# Patient Record
Sex: Male | Born: 1940
Health system: Southern US, Community
[De-identification: ages and names within clinical notes are randomized; demographics above are authoritative.]

## PROBLEM LIST (undated history)

## (undated) DIAGNOSIS — R200 Anesthesia of skin: Secondary | ICD-10-CM

## (undated) DIAGNOSIS — G8929 Other chronic pain: Secondary | ICD-10-CM

## (undated) DIAGNOSIS — Z87438 Personal history of other diseases of male genital organs: Secondary | ICD-10-CM

## (undated) DIAGNOSIS — M545 Low back pain, unspecified: Secondary | ICD-10-CM

## (undated) DIAGNOSIS — M5430 Sciatica, unspecified side: Secondary | ICD-10-CM

## (undated) DIAGNOSIS — E785 Hyperlipidemia, unspecified: Secondary | ICD-10-CM

## (undated) DIAGNOSIS — I2510 Atherosclerotic heart disease of native coronary artery without angina pectoris: Secondary | ICD-10-CM

## (undated) DIAGNOSIS — K219 Gastro-esophageal reflux disease without esophagitis: Secondary | ICD-10-CM

## (undated) DIAGNOSIS — M1990 Unspecified osteoarthritis, unspecified site: Secondary | ICD-10-CM

## (undated) DIAGNOSIS — G2581 Restless legs syndrome: Secondary | ICD-10-CM

## (undated) DIAGNOSIS — I10 Essential (primary) hypertension: Secondary | ICD-10-CM

## (undated) DIAGNOSIS — K439 Ventral hernia without obstruction or gangrene: Secondary | ICD-10-CM

## (undated) DIAGNOSIS — R42 Dizziness and giddiness: Secondary | ICD-10-CM

## (undated) DIAGNOSIS — N400 Enlarged prostate without lower urinary tract symptoms: Secondary | ICD-10-CM

## (undated) HISTORY — DX: Hyperlipidemia, unspecified: E78.5

## (undated) HISTORY — DX: Dizziness and giddiness: R42

## (undated) HISTORY — DX: Anesthesia of skin: R20.0

## (undated) HISTORY — PX: BACK SURGERY: SHX140

## (undated) HISTORY — DX: Benign prostatic hyperplasia without lower urinary tract symptoms: N40.0

## (undated) HISTORY — PX: LUMBAR DISC SURGERY: SHX700

## (undated) HISTORY — DX: Restless legs syndrome: G25.81

## (undated) HISTORY — DX: Sciatica, unspecified side: M54.30

## (undated) HISTORY — PX: JOINT REPLACEMENT: SHX530

## (undated) HISTORY — DX: Personal history of other diseases of male genital organs: Z87.438

## (undated) HISTORY — DX: Ventral hernia without obstruction or gangrene: K43.9

---

## 1998-09-06 ENCOUNTER — Ambulatory Visit (HOSPITAL_COMMUNITY): Admission: RE | Admit: 1998-09-06 | Discharge: 1998-09-06 | Payer: Self-pay | Admitting: Internal Medicine

## 1998-09-06 ENCOUNTER — Encounter: Payer: Self-pay | Admitting: Internal Medicine

## 2009-01-01 ENCOUNTER — Ambulatory Visit (HOSPITAL_COMMUNITY): Admission: RE | Admit: 2009-01-01 | Discharge: 2009-01-01 | Payer: Self-pay | Admitting: *Deleted

## 2009-12-19 ENCOUNTER — Ambulatory Visit: Payer: Self-pay | Admitting: Vascular Surgery

## 2009-12-19 ENCOUNTER — Ambulatory Visit: Admission: RE | Admit: 2009-12-19 | Discharge: 2009-12-19 | Payer: Self-pay | Admitting: Orthopedic Surgery

## 2010-08-25 HISTORY — PX: HAMMER TOE SURGERY: SHX385

## 2011-01-07 NOTE — Op Note (Signed)
NAME:  Cody Floyd, Cody Floyd NO.:  [id number]   MEDICAL RECORD NO.:  [id number]          PATIENT TYPE:  AMB   LOCATION:  ENDO                         FACILITY:  [facility]   PHYSICIAN:  Georgiana Spinner, M.D.    DATE OF BIRTH:  1941-05-22   DATE OF PROCEDURE:  01/01/2009  DATE OF DISCHARGE:                               OPERATIVE REPORT   PROCEDURE:  Upper endoscopy.   INDICATIONS:  Gastroesophageal reflux disease.   ANESTHESIA:  Fentanyl 100 mcg, Versed 8 mg.   PROCEDURE:  With the patient mildly sedated, in the left lateral  decubitus position, the Pentax videoscopic endoscope was inserted in the  mouth, passed under direct vision through the esophagus which appeared  normal into the stomach.  Fundus, body, antrum, duodenal bulb, second  portion of duodenum were all visualized and appeared normal.  From this  point the endoscope was slowly withdrawn taking circumferential views of  duodenal mucosa until the endoscope had been pulled back into stomach,  placed on retroflexion to view the stomach from below.  The endoscope  was straightened and withdrawn taking circumferential views of remaining  gastric and esophageal mucosa.  The patient's vital signs, pulse  oximeter remained stable.  The patient tolerated procedure well without  apparent complications.   FINDINGS:  This was a normal examination without evidence of hiatal  hernia, esophagitis or Barrett's esophagus.   PLAN:  Proceed to colonoscopy.           ______________________________  Georgiana Spinner, M.D.     GMO/MEDQ  D:  01/01/2009  T:  01/01/2009  Job:  [id number]

## 2011-01-07 NOTE — Op Note (Signed)
NAME:  Cody Floyd, KYLLO NO.:  [id number]   MEDICAL RECORD NO.:  [id number]          PATIENT TYPE:  AMB   LOCATION:  ENDO                         FACILITY:  [facility]   PHYSICIAN:  Georgiana Spinner, M.D.    DATE OF BIRTH:  04-19-1941   DATE OF PROCEDURE:  DATE OF DISCHARGE:                               OPERATIVE REPORT   PROCEDURE:  Colonoscopy.   INDICATIONS:  Colon cancer screening.   ANESTHESIA:  Fentanyl 50 mcg, Versed 9 mg.   PROCEDURE:  With the patient mildly sedated in the left lateral  decubitus position, a rectal examination was attempted.  Subsequently,  the Pentax videoscopic colonoscope was inserted into the rectum and  passed under direct vision with pressure applied.  The patient was  uncomfortable through the exam with undersedation at times, but we were  able to advance the endoscope under direct vision subsequently to the  cecum, identified by base of cecum and ileocecal valve and appendiceal  orifice, all of which were photographed.  From this point, the  colonoscope was slowly withdrawn, taking circumferential views of the  colonic mucosa, stopping only to photograph the fact that the patient's  prep was suboptimal and that there were multiple areas of thickish  yellow-green material that was liquid and opaque with solid material  mixed into it which clogged the endoscope suction port, which precluded  therefore thorough examination as we reached the rectum which appeared  normal, indirect that showed hemorrhoids on retroflexed view.  The  endoscope was straightened and withdrawn.  The patient's vital signs,  pulse oximeter remained stable.  The patient tolerated the procedure  well without apparent complications.   FINDINGS:  Slightly suboptimal prep, limited examination, but no gross  lesions seen other than hemorrhoids.   PLAN:  Consider repeat examination as appropriate.           ______________________________  Georgiana Spinner,  M.D.     GMO/MEDQ  D:  01/01/2009  T:  01/01/2009  Job:  [id number]

## 2014-04-17 ENCOUNTER — Other Ambulatory Visit: Payer: Self-pay | Admitting: Orthopedic Surgery

## 2014-04-17 DIAGNOSIS — M545 Low back pain, unspecified: Secondary | ICD-10-CM

## 2014-04-21 ENCOUNTER — Ambulatory Visit
Admission: RE | Admit: 2014-04-21 | Discharge: 2014-04-21 | Disposition: A | Discharge status: Home / Self Care | Payer: Medicare Other | Source: Ambulatory Visit | Attending: Orthopedic Surgery | Admitting: Orthopedic Surgery

## 2014-04-21 DIAGNOSIS — M545 Low back pain, unspecified: Secondary | ICD-10-CM

## 2014-05-16 ENCOUNTER — Other Ambulatory Visit (HOSPITAL_COMMUNITY): Payer: Self-pay | Admitting: Specialist

## 2014-05-24 ENCOUNTER — Ambulatory Visit (HOSPITAL_COMMUNITY)
Admission: RE | Admit: 2014-05-24 | Discharge: 2014-05-24 | Disposition: A | Discharge status: Home / Self Care | Payer: Medicare Other | Source: Ambulatory Visit | Attending: Anesthesiology | Admitting: Anesthesiology

## 2014-05-24 ENCOUNTER — Encounter (HOSPITAL_COMMUNITY): Payer: Self-pay

## 2014-05-24 ENCOUNTER — Encounter (HOSPITAL_COMMUNITY)
Admission: RE | Admit: 2014-05-24 | Discharge: 2014-05-24 | Disposition: A | Discharge status: Home / Self Care | Payer: Medicare Other | Source: Ambulatory Visit | Attending: Specialist | Admitting: Specialist

## 2014-05-24 ENCOUNTER — Encounter (HOSPITAL_COMMUNITY): Payer: Self-pay | Admitting: Pharmacy Technician

## 2014-05-24 DIAGNOSIS — Z01818 Encounter for other preprocedural examination: Secondary | ICD-10-CM | POA: Insufficient documentation

## 2014-05-24 HISTORY — DX: Unspecified osteoarthritis, unspecified site: M19.90

## 2014-05-24 HISTORY — DX: Essential (primary) hypertension: I10

## 2014-05-24 HISTORY — DX: Gastro-esophageal reflux disease without esophagitis: K21.9

## 2014-05-24 LAB — CBC
HEMATOCRIT: 44.8 % (ref 39.0–52.0)
HEMOGLOBIN: 15.2 g/dL (ref 13.0–17.0)
MCH: 28.4 pg (ref 26.0–34.0)
MCHC: 33.9 g/dL (ref 30.0–36.0)
MCV: 83.7 fL (ref 78.0–100.0)
Platelets: 163 10*3/uL (ref 150–400)
RBC: 5.35 MIL/uL (ref 4.22–5.81)
RDW: 15 % (ref 11.5–15.5)
WBC: 7.9 10*3/uL (ref 4.0–10.5)

## 2014-05-24 LAB — COMPREHENSIVE METABOLIC PANEL
ALK PHOS: 73 U/L (ref 39–117)
ALT: 32 U/L (ref 0–53)
ANION GAP: 14 (ref 5–15)
AST: 29 U/L (ref 0–37)
Albumin: 4.2 g/dL (ref 3.5–5.2)
BILIRUBIN TOTAL: 0.8 mg/dL (ref 0.3–1.2)
BUN: 20 mg/dL (ref 6–23)
CHLORIDE: 103 meq/L (ref 96–112)
CO2: 22 mEq/L (ref 19–32)
Calcium: 9.2 mg/dL (ref 8.4–10.5)
Creatinine, Ser: 1.06 mg/dL (ref 0.50–1.35)
GFR calc Af Amer: 78 mL/min — ABNORMAL LOW (ref >90)
GFR calc non Af Amer: 68 mL/min — ABNORMAL LOW (ref >90)
Glucose, Bld: 122 mg/dL — ABNORMAL HIGH (ref 70–99)
POTASSIUM: 4.1 meq/L (ref 3.7–5.3)
SODIUM: 139 meq/L (ref 137–147)
TOTAL PROTEIN: 7.3 g/dL (ref 6.0–8.3)

## 2014-05-24 LAB — TYPE AND SCREEN
ABO/RH(D): A POS
Antibody Screen: NEGATIVE

## 2014-05-24 LAB — SURGICAL PCR SCREEN
MRSA, PCR: NEGATIVE
Staphylococcus aureus: NEGATIVE

## 2014-05-24 LAB — ABO/RH: ABO/RH(D): A POS

## 2014-05-24 NOTE — Progress Notes (Signed)
05/24/14 1159  OBSTRUCTIVE SLEEP APNEA  Have you ever been diagnosed with sleep apnea through a sleep study? No  Do you snore loudly (loud enough to be heard through closed doors)?  0  Do you often feel tired, fatigued, or sleepy during the daytime? 0  Has anyone observed you stop breathing during your sleep? 0  Do you have, or are you being treated for high blood pressure? 1  BMI more than 35 kg/m2? 0  Age over 73 years old? 1  Neck circumference greater than 40 cm/16 inches? 1  Gender: 1  Obstructive Sleep Apnea Score 4  Score 4 or greater  Results sent to PCP

## 2014-05-24 NOTE — H&P (Signed)
Cody Floyd is an 73 y.o. male.   Chief Complaint: leg pain and weakness with ambulation HPI:  He is referred by Dr. Alphonzo Severance for evaluation of increasing back pain with radiation into his lower extremities.  The pain is so severe that he is having difficulties sleeping at nighttime.  It became severe with numbness radiating into the left leg and left foot.  He has noticed difficulty ambulating any distance to the point where he could barely get out of bed and walk even to the front of his house with severe pain.  It began worsening some four months ago.  He reports that he originally had left hip pain and he was placed on a Medrol Dosepak and given some Tylenol #3 back in May.  The pain worsened with severe pain into his left upper buttock and running down into his left leg and into the left heel.  No bowel or bladder symptoms.  He has difficulty rising from a sitting position from laying down to getting up first thing in the morning.  He reports that his pain is severe when he first tries to get up.  He tends to stoop and he tends to lean on carts for support when he is getting in from the parking lot to go shop.  In the past he has had surgery by Dr. Larose Kells with Dr. Marlou Sa assisting years ago for disk protrusion.  He relates that he is experiencing pain in the left anterolateral thigh and into the left anterolateral calf.  Bending and stooping are okay.  He is able to walk up stairs, but down stairs is painful.  RADIOGRAPHS:  The patient's MRI scan is reviewed and plain radiographs are reviewed.  The plain radiographs of the lumbar spine demonstrate, on 12/28/2013, relatively straight spine on the AP radiograph with syndesmophytes forming along the left upper lumbar spine at L1-2 and L2-3.  The SI joints are fairly well maintained as are the hip joints.  The lateral radiographs of the lumbar spine, from the same date, shows anterolisthesis of L4 on L5 and is grade 1 and about 2 or 3 mm.  Degenerative disk  narrowing present at L4-5 about 30% and that at L5-S1 about 70% loss of disk height anterior spurring and endplate sclerosis narrowing of the neural foramen at the L5-S1 level and L4-5 levels.  Some retrolisthesis noted at the L2-3 level, about 3 mm and anterior spurring at this level.  There is also spurring at the L1-2 level with degenerative disk narrowing present.  Lordosis appears to be fairly well maintained.     The patient's MRI scan is demonstrates rather severe multifactorial spinal stenosis at the L3-4 and L4-5 levels.  The degree of stenosis is worse at L4-5 with disk bulge and advanced degenerative joint changes with ligamentous hypertrophy impressing on the thecal sac and narrowing the AP diameter of the canal severely.  He has mild right greater than left foraminal narrowing at L4-5.  At 3-4 moderate to severe spinal stenosis with bulge an facet hypertrophy and congenitally small canal, mass effect on the thecal sac narrowing the lateral recess at that segment.  L5-S1 shows postsurgical changes on the right side.   There is epidural scar present.  The study was not done with enhancement and no significant nerve root encroachment felt to be present at the L5-S1 level.  At L2-3 retrolisthesis that is mild with bulging disk at that segment.  Narrowing of the lateral recesses, but no foraminal  compromise or definite nerve encroachment.   Past Medical History  Diagnosis Date  . Hypertension   . GERD (gastroesophageal reflux disease)   . Arthritis     Past Surgical History  Procedure Laterality Date  . Back surgery    . Hammer toe surgery Left 2012    History reviewed. No pertinent family history. Social History:  reports that he has never smoked. He has never used smokeless tobacco. He reports that he does not drink alcohol or use illicit drugs.  Allergies: No Known Allergies  Medications Prior to Admission  Medication Sig Dispense Refill  . esomeprazole (NEXIUM) 20 MG capsule Take  20 mg by mouth daily as needed.      . propranolol (INNOPRAN XL) 120 MG 24 hr capsule Take 120 mg by mouth daily as needed (Pt knows when to take because of how he feels in the morning).        No results found for this or any previous visit (from the past 48 hour(s)). No results found.  Review of Systems  Musculoskeletal: Positive for back pain.       Bilateral leg weakness.  All other systems reviewed and are negative.   Blood pressure 171/71, pulse 67, temperature 97.9 F (36.6 C), temperature source Oral, resp. rate 20, height 6\' 1"  (1.854 m), weight 108.863 kg (240 lb), SpO2 95.00%. Physical Exam  Constitutional: He is oriented to person, place, and time. He appears well-developed and well-nourished.  HENT:  Head: Normocephalic and atraumatic.  Eyes: EOM are normal. Pupils are equal, round, and reactive to light.  Neck: Normal range of motion.  Cardiovascular: Normal rate, regular rhythm and normal heart sounds.   Respiratory: Effort normal and breath sounds normal.  GI: Soft.  Musculoskeletal:   he can forward bend fingertips about four inches from the floor.  Extension is decreased and lordosis is decreased.  Reflexes at the knee are trace and symmetric and at the ankle 0 and symmetric.  He has weakness in left foot dorsiflexion and it is 4+/5.  EHL strength is decreased on the left at 5-/5.  The sciatic stretch sign is mildly positive on the left at about 20-30 degrees short of full extension and negative on the right.  Popliteal compression sign is negative on both sides.  Range of motion of the lumbar spine as described.  The pain is localizing to the left posterior superior iliac spine left upper buttock.  He is nontender over the left greater trochanter.  His strength in hip abduction, adduction and hip flexion testing are normal.  Knee extension strength is slightly decreased on the left compared to the right at about 5-/5.  Reverse straight leg raise is positive on the left  side.    Neurological: He is alert and oriented to person, place, and time.  Skin: Skin is warm and dry.      ASSESSMENT:  This patient's study shows severe narrowing of the spinal canal at both L3-4 and L4-5.  His clinical exam is consistent with left L4 radiculopathy and left L3-4 radiculopathy on the left.  Severe worsening pain and discomfort associated with increasing symptoms of neurogenic claudication consistent with rather severe finding of lumbar spinal stenosis.    PLAN:  central decompressive laminectomy at both the L3-4 and L4-5 levels for severe stenosis  NITKA,JAMES E 05/29/2014, 7:17 AM

## 2014-05-24 NOTE — Progress Notes (Signed)
PCP:Dr. Jani Gravel at [facility] [phone]. Requesting ekg and last office note

## 2014-05-24 NOTE — Pre-Procedure Instructions (Signed)
JAYMIEN LANDIN  05/24/2014   Your procedure is scheduled on:  Monday October 5  Report to [facility] Main Entrance "A" at 5:30AM.  Call this number if you have problems the morning of surgery: [phone]   Remember:   Do not eat food or drink liquids after midnight.   Take these medicines the morning of surgery with A SIP OF WATER: nexium, propranolol if needed   Do not wear jewelry, make-up or nail polish.  Do not wear lotions, powders, or perfumes. You may wear deodorant.  Do not shave 48 hours prior to surgery. Men may shave face and neck.  Do not bring valuables to the hospital.  [facility] is not responsible  for any belongings or valuables.               Contacts, dentures or bridgework may not be worn into surgery.  Leave suitcase in the car. After surgery it may be brought to your room.  For patients admitted to the hospital, discharge time is determined by your                treatment team.               Patients discharged the day of surgery will not be allowed to drive  home.  Name and phone number of your driver:   Special Instructions: review preparing for surgery handout   Please read over the following fact sheets that you were given: Pain Booklet, Coughing and Deep Breathing, Blood Transfusion Information, MRSA Information and Surgical Site Infection Prevention

## 2014-05-25 NOTE — Progress Notes (Signed)
Anesthesia Chart Review: Patient is a 73 year old male scheduled for L3-4, L4-5 central laminectomy on 05/29/14 by Dr. Louanne Skye.  History includes non-smoker, HTN, GERD, arthritis, back surgery. BMI is consistent with mild obesity.  PCP is Dr. Maudie Mercury [phone]). Prior EKG requested for Dr. Julianne Rice office if available. Currently no records have been received, and there is no available comparison EKG in Epic or Muse.     EKG on 05/24/14 showed SB with first degree AVB, LAD, right BBB. Prior EKG requested from Dr. Julianne Rice office as available, but records are pending and there are no prior EKGs in Epic or Muse.  No reported history of CAD/MI/CHF or DM.  No CV symptoms documented from his PAT visit.   Preoperative CXR and labs noted.   I've asked nursing staff to have me or Kabbe, FNP-C review prior EKG if received from his PCP, otherwise in none available then he will be further evaluated by his assigned anesthesiologist on the day of surgery to further clinically correlate.  If he remains asymptomatic from a CV standpoint then I would anticipate that he could proceed as planned.    Cody Floyd [facility] Short Stay Center/Anesthesiology Phone [phone] 05/25/2014 5:06 PM

## 2014-05-28 MED ORDER — CHLORHEXIDINE GLUCONATE 4 % EX LIQD
60.0000 mL | Freq: Once | CUTANEOUS | Status: DC
  Filled 2014-05-28: qty 60

## 2014-05-28 MED ORDER — CEFAZOLIN SODIUM-DEXTROSE 2-3 GM-% IV SOLR
2.0000 g | INTRAVENOUS | Status: AC
  Administered 2014-05-29: 2 g via INTRAVENOUS
  Filled 2014-05-28: qty 50

## 2014-05-29 ENCOUNTER — Observation Stay (HOSPITAL_COMMUNITY)
Admission: RE | Admit: 2014-05-29 | Discharge: 2014-05-30 | Disposition: A | Discharge status: Home Health | Payer: Medicare Other | Source: Ambulatory Visit | Attending: Specialist | Admitting: Specialist

## 2014-05-29 ENCOUNTER — Encounter (HOSPITAL_COMMUNITY)
Admission: RE | Disposition: A | Discharge status: Home Health | Payer: Self-pay | Source: Ambulatory Visit | Attending: Specialist

## 2014-05-29 ENCOUNTER — Ambulatory Visit (HOSPITAL_COMMUNITY): Payer: Medicare Other

## 2014-05-29 ENCOUNTER — Ambulatory Visit (HOSPITAL_COMMUNITY): Payer: Medicare Other | Admitting: Anesthesiology

## 2014-05-29 ENCOUNTER — Encounter (HOSPITAL_COMMUNITY): Payer: Self-pay | Admitting: *Deleted

## 2014-05-29 ENCOUNTER — Encounter (HOSPITAL_COMMUNITY): Payer: Medicare Other | Admitting: Vascular Surgery

## 2014-05-29 DIAGNOSIS — R262 Difficulty in walking, not elsewhere classified: Secondary | ICD-10-CM | POA: Insufficient documentation

## 2014-05-29 DIAGNOSIS — I10 Essential (primary) hypertension: Secondary | ICD-10-CM | POA: Diagnosis not present

## 2014-05-29 DIAGNOSIS — M4806 Spinal stenosis, lumbar region: Principal | ICD-10-CM | POA: Insufficient documentation

## 2014-05-29 DIAGNOSIS — K219 Gastro-esophageal reflux disease without esophagitis: Secondary | ICD-10-CM | POA: Insufficient documentation

## 2014-05-29 DIAGNOSIS — Z6831 Body mass index (BMI) 31.0-31.9, adult: Secondary | ICD-10-CM | POA: Insufficient documentation

## 2014-05-29 DIAGNOSIS — M5116 Intervertebral disc disorders with radiculopathy, lumbar region: Secondary | ICD-10-CM | POA: Insufficient documentation

## 2014-05-29 DIAGNOSIS — M5416 Radiculopathy, lumbar region: Secondary | ICD-10-CM | POA: Diagnosis present

## 2014-05-29 DIAGNOSIS — M48062 Spinal stenosis, lumbar region with neurogenic claudication: Secondary | ICD-10-CM | POA: Diagnosis present

## 2014-05-29 HISTORY — PX: LUMBAR LAMINECTOMY/DECOMPRESSION MICRODISCECTOMY: SHX5026

## 2014-05-29 SURGERY — LUMBAR LAMINECTOMY/DECOMPRESSION MICRODISCECTOMY
Anesthesia: General

## 2014-05-29 MED ORDER — ACETAMINOPHEN 325 MG PO TABS: 650.0000 mg | ORAL_TABLET | ORAL | Status: DC | PRN

## 2014-05-29 MED ORDER — ROCURONIUM BROMIDE 100 MG/10ML IV SOLN
INTRAVENOUS | Status: DC | PRN
  Administered 2014-05-29: 50 mg via INTRAVENOUS
  Administered 2014-05-29 (×3): 10 mg via INTRAVENOUS

## 2014-05-29 MED ORDER — THROMBIN 20000 UNITS EX SOLR
CUTANEOUS | Status: AC
  Filled 2014-05-29: qty 20000

## 2014-05-29 MED ORDER — BUPIVACAINE HCL 0.5 % IJ SOLN
INTRAMUSCULAR | Status: DC | PRN
  Administered 2014-05-29: 30 mL

## 2014-05-29 MED ORDER — PROMETHAZINE HCL 25 MG/ML IJ SOLN
12.5000 mg | Freq: Four times a day (QID) | INTRAMUSCULAR | Status: DC | PRN
  Administered 2014-05-29: 12.5 mg via INTRAVENOUS
  Filled 2014-05-29: qty 1

## 2014-05-29 MED ORDER — MIDAZOLAM HCL 5 MG/5ML IJ SOLN
INTRAMUSCULAR | Status: DC | PRN
  Administered 2014-05-29: 1 mg via INTRAVENOUS

## 2014-05-29 MED ORDER — ONDANSETRON HCL 4 MG/2ML IJ SOLN
INTRAMUSCULAR | Status: DC | PRN
  Administered 2014-05-29: 4 mg via INTRAVENOUS

## 2014-05-29 MED ORDER — ALUM & MAG HYDROXIDE-SIMETH 200-200-20 MG/5ML PO SUSP: 30.0000 mL | Freq: Four times a day (QID) | ORAL | Status: DC | PRN

## 2014-05-29 MED ORDER — GLYCOPYRROLATE 0.2 MG/ML IJ SOLN
INTRAMUSCULAR | Status: DC | PRN
  Administered 2014-05-29: .8 mg via INTRAVENOUS

## 2014-05-29 MED ORDER — EPHEDRINE SULFATE 50 MG/ML IJ SOLN
INTRAMUSCULAR | Status: DC | PRN
  Administered 2014-05-29: 5 mg via INTRAVENOUS
  Administered 2014-05-29: 10 mg via INTRAVENOUS

## 2014-05-29 MED ORDER — 0.9 % SODIUM CHLORIDE (POUR BTL) OPTIME
TOPICAL | Status: DC | PRN
  Administered 2014-05-29: 1000 mL

## 2014-05-29 MED ORDER — LACTATED RINGERS IV SOLN
INTRAVENOUS | Status: DC | PRN
  Administered 2014-05-29 (×3): via INTRAVENOUS

## 2014-05-29 MED ORDER — PHENYLEPHRINE 40 MCG/ML (10ML) SYRINGE FOR IV PUSH (FOR BLOOD PRESSURE SUPPORT)
PREFILLED_SYRINGE | INTRAVENOUS | Status: AC
  Filled 2014-05-29: qty 10

## 2014-05-29 MED ORDER — OXYCODONE HCL 5 MG/5ML PO SOLN: 5.0000 mg | Freq: Once | ORAL | Status: DC | PRN

## 2014-05-29 MED ORDER — PHENYLEPHRINE HCL 10 MG/ML IJ SOLN
10.0000 mg | INTRAMUSCULAR | Status: DC | PRN
  Administered 2014-05-29: 40 ug/min via INTRAVENOUS

## 2014-05-29 MED ORDER — FENTANYL CITRATE 0.05 MG/ML IJ SOLN
INTRAMUSCULAR | Status: AC
  Filled 2014-05-29: qty 5

## 2014-05-29 MED ORDER — FENTANYL CITRATE 0.05 MG/ML IJ SOLN
INTRAMUSCULAR | Status: DC | PRN
  Administered 2014-05-29 (×3): 50 ug via INTRAVENOUS

## 2014-05-29 MED ORDER — DEXTROSE 5 % IV SOLN
INTRAVENOUS | Status: DC | PRN
  Administered 2014-05-29: 08:00:00 via INTRAVENOUS

## 2014-05-29 MED ORDER — PROPRANOLOL HCL ER BEADS 120 MG PO CP24
120.0000 mg | ORAL_CAPSULE | Freq: Every day | ORAL | Status: DC | PRN
  Filled 2014-05-29: qty 1

## 2014-05-29 MED ORDER — SODIUM CHLORIDE 0.9 % IJ SOLN: 3.0000 mL | INTRAMUSCULAR | Status: DC | PRN

## 2014-05-29 MED ORDER — BUPIVACAINE-EPINEPHRINE 0.5% -1:200000 IJ SOLN
INTRAMUSCULAR | Status: DC | PRN
  Administered 2014-05-29: 20 mL

## 2014-05-29 MED ORDER — HYDROMORPHONE HCL 1 MG/ML IJ SOLN: 0.2500 mg | INTRAMUSCULAR | Status: DC | PRN

## 2014-05-29 MED ORDER — PROMETHAZINE HCL 12.5 MG PO TABS: 12.5000 mg | ORAL_TABLET | Freq: Four times a day (QID) | ORAL | Status: DC | PRN

## 2014-05-29 MED ORDER — GELATIN ABSORBABLE MT POWD
OROMUCOSAL | Status: DC | PRN
  Administered 2014-05-29: 10:00:00 via TOPICAL

## 2014-05-29 MED ORDER — OXYCODONE-ACETAMINOPHEN 5-325 MG PO TABS: 1.0000 | ORAL_TABLET | ORAL | Status: DC | PRN

## 2014-05-29 MED ORDER — SODIUM CHLORIDE 0.9 % IJ SOLN: 3.0000 mL | Freq: Two times a day (BID) | INTRAMUSCULAR | Status: DC

## 2014-05-29 MED ORDER — METHOCARBAMOL 500 MG PO TABS: 500.0000 mg | ORAL_TABLET | Freq: Four times a day (QID) | ORAL | Status: DC | PRN

## 2014-05-29 MED ORDER — OXYCODONE HCL 5 MG PO TABS: 5.0000 mg | ORAL_TABLET | Freq: Once | ORAL | Status: DC | PRN

## 2014-05-29 MED ORDER — SODIUM CHLORIDE 0.9 % IV SOLN
INTRAVENOUS | Status: DC
  Administered 2014-05-29 – 2014-05-30 (×2): via INTRAVENOUS

## 2014-05-29 MED ORDER — NEOSTIGMINE METHYLSULFATE 10 MG/10ML IV SOLN
INTRAVENOUS | Status: AC
  Filled 2014-05-29: qty 1

## 2014-05-29 MED ORDER — ACETAMINOPHEN 650 MG RE SUPP: 650.0000 mg | RECTAL | Status: DC | PRN

## 2014-05-29 MED ORDER — MIDAZOLAM HCL 2 MG/2ML IJ SOLN
INTRAMUSCULAR | Status: AC
  Filled 2014-05-29: qty 2

## 2014-05-29 MED ORDER — ARTIFICIAL TEARS OP OINT
TOPICAL_OINTMENT | OPHTHALMIC | Status: AC
  Filled 2014-05-29: qty 3.5

## 2014-05-29 MED ORDER — PHENOL 1.4 % MT LIQD: 1.0000 | OROMUCOSAL | Status: DC | PRN

## 2014-05-29 MED ORDER — PANTOPRAZOLE SODIUM 40 MG PO TBEC
40.0000 mg | DELAYED_RELEASE_TABLET | Freq: Every day | ORAL | Status: DC
  Administered 2014-05-29 – 2014-05-30 (×2): 40 mg via ORAL
  Filled 2014-05-29 (×2): qty 1

## 2014-05-29 MED ORDER — METHOCARBAMOL 1000 MG/10ML IJ SOLN
500.0000 mg | Freq: Four times a day (QID) | INTRAMUSCULAR | Status: DC | PRN
  Filled 2014-05-29: qty 5

## 2014-05-29 MED ORDER — THROMBIN 20000 UNITS EX KIT
PACK | CUTANEOUS | Status: DC | PRN
  Administered 2014-05-29: 20000 [IU] via TOPICAL

## 2014-05-29 MED ORDER — NEOSTIGMINE METHYLSULFATE 10 MG/10ML IV SOLN
INTRAVENOUS | Status: DC | PRN
  Administered 2014-05-29: 5 mg via INTRAVENOUS

## 2014-05-29 MED ORDER — ROCURONIUM BROMIDE 50 MG/5ML IV SOLN
INTRAVENOUS | Status: AC
  Filled 2014-05-29: qty 2

## 2014-05-29 MED ORDER — SODIUM CHLORIDE 0.9 % IV SOLN: 250.0000 mL | INTRAVENOUS | Status: DC

## 2014-05-29 MED ORDER — EPHEDRINE SULFATE 50 MG/ML IJ SOLN
INTRAMUSCULAR | Status: AC
  Filled 2014-05-29: qty 1

## 2014-05-29 MED ORDER — MENTHOL 3 MG MT LOZG
1.0000 | LOZENGE | OROMUCOSAL | Status: DC | PRN
  Administered 2014-05-29: 3 mg via ORAL
  Filled 2014-05-29: qty 9

## 2014-05-29 MED ORDER — ONDANSETRON HCL 4 MG/2ML IJ SOLN
4.0000 mg | INTRAMUSCULAR | Status: DC | PRN
  Administered 2014-05-29: 4 mg via INTRAVENOUS
  Filled 2014-05-29: qty 2

## 2014-05-29 MED ORDER — PROPOFOL 10 MG/ML IV BOLUS
INTRAVENOUS | Status: DC | PRN
  Administered 2014-05-29: 190 mg via INTRAVENOUS

## 2014-05-29 MED ORDER — OXYCODONE-ACETAMINOPHEN 5-325 MG PO TABS
1.0000 | ORAL_TABLET | ORAL | Status: DC | PRN
  Administered 2014-05-29 – 2014-05-30 (×2): 2 via ORAL
  Filled 2014-05-29 (×2): qty 2

## 2014-05-29 MED ORDER — LIDOCAINE HCL (CARDIAC) 20 MG/ML IV SOLN
INTRAVENOUS | Status: DC | PRN
  Administered 2014-05-29: 55 mg via INTRAVENOUS

## 2014-05-29 MED ORDER — SODIUM CHLORIDE 0.9 % IJ SOLN
INTRAMUSCULAR | Status: AC
  Filled 2014-05-29: qty 10

## 2014-05-29 MED ORDER — LIDOCAINE HCL (CARDIAC) 20 MG/ML IV SOLN
INTRAVENOUS | Status: AC
  Filled 2014-05-29: qty 5

## 2014-05-29 MED ORDER — PROMETHAZINE HCL 25 MG/ML IJ SOLN: 6.2500 mg | INTRAMUSCULAR | Status: DC | PRN

## 2014-05-29 MED ORDER — PROPOFOL 10 MG/ML IV BOLUS
INTRAVENOUS | Status: AC
  Filled 2014-05-29: qty 20

## 2014-05-29 MED ORDER — ARTIFICIAL TEARS OP OINT
TOPICAL_OINTMENT | OPHTHALMIC | Status: DC | PRN
  Administered 2014-05-29: 1 via OPHTHALMIC

## 2014-05-29 MED ORDER — EVICEL 2 ML EX KIT
PACK | CUTANEOUS | Status: AC
  Filled 2014-05-29: qty 1

## 2014-05-29 MED ORDER — ONDANSETRON HCL 4 MG/2ML IJ SOLN
INTRAMUSCULAR | Status: AC
  Filled 2014-05-29: qty 2

## 2014-05-29 MED ORDER — BUPIVACAINE-EPINEPHRINE (PF) 0.5% -1:200000 IJ SOLN
INTRAMUSCULAR | Status: AC
  Filled 2014-05-29: qty 30

## 2014-05-29 MED ORDER — MORPHINE SULFATE 2 MG/ML IJ SOLN: 1.0000 mg | INTRAMUSCULAR | Status: DC | PRN

## 2014-05-29 MED ORDER — PHENYLEPHRINE HCL 10 MG/ML IJ SOLN
INTRAMUSCULAR | Status: DC | PRN
  Administered 2014-05-29 (×2): 80 ug via INTRAVENOUS

## 2014-05-29 MED ORDER — HYDROCODONE-ACETAMINOPHEN 5-325 MG PO TABS
1.0000 | ORAL_TABLET | ORAL | Status: DC | PRN
  Administered 2014-05-30: 2 via ORAL
  Filled 2014-05-29: qty 2

## 2014-05-29 MED ORDER — CEFAZOLIN SODIUM 1-5 GM-% IV SOLN
1.0000 g | Freq: Three times a day (TID) | INTRAVENOUS | Status: AC
  Administered 2014-05-29 (×2): 1 g via INTRAVENOUS
  Filled 2014-05-29 (×3): qty 50

## 2014-05-29 MED ORDER — BUPIVACAINE LIPOSOME 1.3 % IJ SUSP
20.0000 mL | INTRAMUSCULAR | Status: AC
  Administered 2014-05-29: 20 mL
  Filled 2014-05-29: qty 20

## 2014-05-29 MED ORDER — GLYCOPYRROLATE 0.2 MG/ML IJ SOLN
INTRAMUSCULAR | Status: AC
  Filled 2014-05-29: qty 4

## 2014-05-29 SURGICAL SUPPLY — 59 items
AIRSTRIP 3X4 (GAUZE/BANDAGES/DRESSINGS) ×2 IMPLANT
BLADE SURG 10 STRL SS (BLADE) ×2 IMPLANT
BUR ROUND FLUTED 4 SOFT TCH (BURR) IMPLANT
CORDS BIPOLAR (ELECTRODE) ×2 IMPLANT
DERMABOND ADVANCED (GAUZE/BANDAGES/DRESSINGS) ×1
DERMABOND ADVANCED .7 DNX12 (GAUZE/BANDAGES/DRESSINGS) ×1 IMPLANT
DRAPE INCISE IOBAN 66X45 STRL (DRAPES) IMPLANT
DRAPE MICROSCOPE LEICA (MISCELLANEOUS) ×2 IMPLANT
DRAPE POUCH INSTRU U-SHP 10X18 (DRAPES) ×2 IMPLANT
DRAPE PROXIMA HALF (DRAPES) IMPLANT
DRAPE SURG 17X23 STRL (DRAPES) ×8 IMPLANT
DRSG MEPILEX BORDER 4X4 (GAUZE/BANDAGES/DRESSINGS) IMPLANT
DRSG MEPILEX BORDER 4X8 (GAUZE/BANDAGES/DRESSINGS) ×2 IMPLANT
DURAPREP 26ML APPLICATOR (WOUND CARE) ×2 IMPLANT
ELECT BLADE 4.0 EZ CLEAN MEGAD (MISCELLANEOUS) ×2
ELECT CAUTERY BLADE 6.4 (BLADE) ×2 IMPLANT
ELECT REM PT RETURN 9FT ADLT (ELECTROSURGICAL) ×2
ELECTRODE BLDE 4.0 EZ CLN MEGD (MISCELLANEOUS) ×1 IMPLANT
ELECTRODE REM PT RTRN 9FT ADLT (ELECTROSURGICAL) ×1 IMPLANT
EVACUATOR 1/8 PVC DRAIN (DRAIN) IMPLANT
GLOVE BIO SURGEON STRL SZ7 (GLOVE) ×4 IMPLANT
GLOVE BIOGEL PI IND STRL 7.0 (GLOVE) ×3 IMPLANT
GLOVE BIOGEL PI IND STRL 7.5 (GLOVE) ×1 IMPLANT
GLOVE BIOGEL PI INDICATOR 7.0 (GLOVE) ×3
GLOVE BIOGEL PI INDICATOR 7.5 (GLOVE) ×1
GLOVE ECLIPSE 7.0 STRL STRAW (GLOVE) ×8 IMPLANT
GLOVE ECLIPSE 9.0 STRL (GLOVE) ×2 IMPLANT
GLOVE SURG 8.5 LATEX PF (GLOVE) ×12 IMPLANT
GOWN STRL REUS W/ TWL LRG LVL3 (GOWN DISPOSABLE) ×2 IMPLANT
GOWN STRL REUS W/TWL 2XL LVL3 (GOWN DISPOSABLE) ×2 IMPLANT
GOWN STRL REUS W/TWL LRG LVL3 (GOWN DISPOSABLE) ×2
KIT BASIN OR (CUSTOM PROCEDURE TRAY) ×2 IMPLANT
KIT ROOM TURNOVER OR (KITS) ×2 IMPLANT
MANIFOLD NEPTUNE II (INSTRUMENTS) ×2 IMPLANT
NEEDLE 22X1 1/2 (OR ONLY) (NEEDLE) ×2 IMPLANT
NEEDLE SPNL 18GX3.5 QUINCKE PK (NEEDLE) ×4 IMPLANT
NS IRRIG 1000ML POUR BTL (IV SOLUTION) ×2 IMPLANT
PACK LAMINECTOMY ORTHO (CUSTOM PROCEDURE TRAY) ×2 IMPLANT
PAD ARMBOARD 7.5X6 YLW CONV (MISCELLANEOUS) ×4 IMPLANT
PATTIES SURGICAL .5 X.5 (GAUZE/BANDAGES/DRESSINGS) IMPLANT
PATTIES SURGICAL .75X.75 (GAUZE/BANDAGES/DRESSINGS) IMPLANT
PATTIES SURGICAL 1X1 (DISPOSABLE) IMPLANT
SPECIMEN JAR SMALL (MISCELLANEOUS) ×2 IMPLANT
SPONGE LAP 4X18 X RAY DECT (DISPOSABLE) IMPLANT
SPONGE SURGIFOAM ABS GEL 100 (HEMOSTASIS) IMPLANT
SUT VIC AB 0 CT1 27 (SUTURE) ×2
SUT VIC AB 0 CT1 27XBRD ANBCTR (SUTURE) ×2 IMPLANT
SUT VIC AB 1 CT1 27 (SUTURE) ×3
SUT VIC AB 1 CT1 27XBRD ANBCTR (SUTURE) ×3 IMPLANT
SUT VIC AB 2-0 CT1 27 (SUTURE)
SUT VIC AB 2-0 CT1 TAPERPNT 27 (SUTURE) IMPLANT
SUT VICRYL 0 UR6 27IN ABS (SUTURE) IMPLANT
SUT VICRYL 4-0 PS2 18IN ABS (SUTURE) ×2 IMPLANT
SYR CONTROL 10ML LL (SYRINGE) ×2 IMPLANT
TOWEL OR 17X24 6PK STRL BLUE (TOWEL DISPOSABLE) ×2 IMPLANT
TOWEL OR 17X26 10 PK STRL BLUE (TOWEL DISPOSABLE) ×2 IMPLANT
TRAY FOLEY CATH 16FRSI W/METER (SET/KITS/TRAYS/PACK) IMPLANT
WATER STERILE IRR 1000ML POUR (IV SOLUTION) ×2 IMPLANT
YANKAUER SUCT BULB TIP NO VENT (SUCTIONS) ×2 IMPLANT

## 2014-05-29 NOTE — Interval H&P Note (Signed)
History and Physical Interval Note:  05/29/2014 7:17 AM  Cody Floyd  has presented today for surgery, with the diagnosis of severe spinal stenosis L3-4, L4-5  The various methods of treatment have been discussed with the patient and family. After consideration of risks, benefits and other options for treatment, the patient has consented to  Procedure(s): CENTRAL LUMBAR LAMINECTOMY L3-4 and L4-5 (N/A) as a surgical intervention .  The patient's history has been reviewed, patient examined, no change in status, stable for surgery.  I have reviewed the patient's chart and labs.  Questions were answered to the patient's satisfaction.     Valyncia Wiens E

## 2014-05-29 NOTE — Op Note (Addendum)
05/29/2014  10:30 AM  PATIENT:  Cody Floyd  73 y.o. male  MRN: [id number]  OPERATIVE REPORT  PRE-OPERATIVE DIAGNOSIS:  severe spinal stenosis L3-4, L4-5  POST-OPERATIVE DIAGNOSIS:  severe spinal stenosis L3-4, L4-5  PROCEDURE:  Procedure(s): CENTRAL LUMBAR LAMINECTOMY L3-4 and L4-5    SURGEON:  Jessy Oto, MD     ASSISTANT:  Phillips Hay, PA-C  (Present throughout the entire procedure and necessary for completion of procedure in a timely manner)     ANESTHESIA:  General,supplemented with MARCAINE 0.5% 1:1 EXPAREL 1.3%, Amount: 30 ml Dr. Orene Desanctis.    COMPLICATIONS:  None.     EBL: 300 cc  DRAINS: Foley to SD.   PROCEDURE: The patient was met in the holding area, and the appropriate L3-4 and  L4-5 levels identified and marked with an "X" and my initials since this was a bilateral procedure. The patient was then transported to OR and was placed under general anesthesia without difficulty. Then placed on the operative table in a prone position. The [facility] spine OR table was used. The patient received appropriate preoperative antibiotic prophylaxis. Nursing staff inserted a Foley catheter under sterile conditions prior to turning. Standard prep with DuraPrep solution from the mid dorsal spine to the mid sacral level.Time-out procedure was called and correct .  Patient was draped in the usual manner. Iodine Vi-Drape was used. 2 x 18-gauge spinal needles were placed at the expected and of the expected levels. Intraoperative lateral xray demonstrated the needles placed posterior to the L3 and L4 levels. Initial incisions were made at palpable process representing L3 and at the lowest aspect of the incision at the expected level of L4 approximately 2 1/2inches in length. The incision were infiltrated with marcaine 1/2% plain:exparel 1.3% 1:1. Total of 20 cc were used. Skin subcutaneous layers divide down to the lumbodorsal fascia this was incised in both  L2-3, L3-4 and L4-5 spinous  processes, clamps placed at the spinous process at L3 and L4. A lateral radiograph demonstrated the upper clamp at the L3 spinous process. Lower clamp was at the L4 spinous process. The L3-4 to L4-5 levels were then exposed using Cobb elevators and electrocautery. These areas were then packed. Boss McCollough retractor was inserted at the incision site. Leksell rongeur used to remove a portion of the inferior aspect is process of L3 40% and the entire spinous processes of L4 and superior 30% of L5. Leksell rongeur was then used to further remove bone down to the ligamentum flavum and the central portions of the lamina and base of the spinous process were thinned. 4 mm burr was also used to thin the lamina of L4 centrally and the inferior 50% of the lamina of L3. The facets were then exposed out laterally and were hypertrophic.Osteotomes then used medial aspect of the L3-4 facets and L4-5 facet bilaterally resecting approximately 10-15% of the facet bilaterally. Kerrisons were then used to resect central portions of the lamina of L4 and the inferior 50% of L3 lamina. Ligamentum flavum at the L3-4 and L4-5 levels were then carefully resected centrally preserving at least 7-8 mm with at the pars level L4 and L3. The central laminectomy was further widened performing more resection of the medial aspect of facet at both L3-4 and L4-5. Note that loupe magnification and headlight was used for this portion procedure.the central portions of the ligamentum flavum at the L3-4 level were resected and the medial aspect of the L3-4 facet resected over about 5-10%. Hypertrophic flava  was found to be present. The operating room microscope sterilely draped brought into the field and then carefully the right side decompressed at each level beginning at the right L5 neuroforamen resect bone over the superior lamina of L5 and decompressing the right L5 foramen and reflected portion ligamentum flavum off the medial aspect of the right  L3-4 facet. Hockey-stick nerve probe could be passed out both the L4 and L5 neuroforamen. The medial aspect of facet at the L3-4 level was then carefully evaluated and hypertrophic ligamentum flavum resected using Kerrisons decompressing the lateral recess on this right side. This was done such that hockey-stick nerve probe could be used to pass the outer recess demonstrating patency and decompression of the L4 nerve root and L5 nerve roots. Attention then turned to the L3-4 were further debridement of the lateral recess and hypertrophic ligamentum flavum and reflected portions of ligamentum flavum were carried out. Changing sides of the OR table in decompression was carried out in similar fashion on the left side from the left L5 nerve root to the L3-4 level. At the left L3-4 level severe lateral recess stenosis was noted lateral recess was decompressed and the L4 nerve root freed up.  Further decompression was then carried out the left side of the spinal canal decompressing the L5, the L4 and the L3 nerve roots. Following this then a hockey-stick nerve probe could be passed out easily bilateral L3, L4 and L5 neuroforamen. Following decompression bilaterally thrombin-soaked Gelfoam was applied to the laminotomy defects and these areas packed with small sponges. Adequate hemostasis was obtained at all levels. The thrombin-soaked Gelfoam was then removed and a medium. The incision showed no active bleeding present.  Boss McCollough retractor was then removed. The end of the case, an approximately 15 mm central laminotomy was present with normal pulsation of the thecal sac present. Following additional irrigation and with no active bleeding present at any level level, the incision was then closed by first approximating the lumbar muscles the midline with interrupted #1 Vicryl sutures loosely the lumbodorsal fascia then approximated with interrupted #1 Vicryl sutures.  Additional MARCAINE 0.5% 1:1 EXPAREL 1.3%, Amount:  10 ml infiltrated the fascia.  Deep subcutaneous layers approximated with interrupted #0 Vicryl suture more superficial layers with interrupted 2-0 Vicryl suture the skin closed with a running subcutaneous stitch of 4-0 Vicryl. Dermabond was applied to the skin margins.  A mepilex bandage was applied to the midline incision site. All instrument and sponge counts were correct. Patient was then reactivated returned to supine position and extubated. He was then returned to recovery room in satisfactory condition.   Phillips Hay, PA-C performed the duties of assistant surgeon throughout this case she assisted using loupe magnification and OR microscope retracting delicate neural structures suctioning over the neural structures throughout the case bilaterally . She was present from the beginning of the case to the end of the case. Assisted in positioning the patient and  the arm and legs. She also participated in removal the patient from the operating table.    Ajahnae Rathgeber E  05/29/2014, 10:30 AM

## 2014-05-29 NOTE — Transfer of Care (Signed)
Immediate Anesthesia Transfer of Care Note  Patient: Cody Floyd  Procedure(s) Performed: Procedure(s): CENTRAL LUMBAR LAMINECTOMY L3-4 and L4-5 (N/A)  Patient Location: PACU  Anesthesia Type:General  Level of Consciousness: awake, sedated and patient cooperative  Airway & Oxygen Therapy: Patient Spontanous Breathing and Patient connected to nasal cannula oxygen  Post-op Assessment: Report given to PACU RN, Post -op Vital signs reviewed and stable and Patient moving all extremities  Post vital signs: Reviewed and stable  Complications: No apparent anesthesia complications

## 2014-05-29 NOTE — Brief Op Note (Signed)
05/29/2014  10:13 AM  PATIENT:  Cody Floyd  73 y.o. male  PRE-OPERATIVE DIAGNOSIS:  severe spinal stenosis L3-4, L4-5  POST-OPERATIVE DIAGNOSIS:  severe spinal stenosis L3-4, L4-5  PROCEDURE:  Procedure(s): CENTRAL LUMBAR LAMINECTOMY L3-4 and L4-5 (N/A)OR MICROSCOPE  SURGEON:  Surgeon(s) and Role:James Louanne Skye, MD    * Jessy Oto, MD - Primary  PHYSICIAN ASSISTANT: Phillips Hay, PA-C   ANESTHESIA:   local and general, Dr. Orene Desanctis  EBL:  Total I/O In: 2902 [I.V.:1050] Out: 410 [Urine:110; Blood:300]  BLOOD ADMINISTERED:none  DRAINS: Urinary Catheter (Foley)   LOCAL MEDICATIONS USED:  MARCAINE 0.5% 1:1 EXPAREL 1.3%, Amount: 30 ml   SPECIMEN:  No Specimen  DISPOSITION OF SPECIMEN:  N/A  COUNTS:  YES  TOURNIQUET:  * No tourniquets in log *  DICTATION: .Dragon Dictation  PLAN OF CARE: Admit for overnight observation  PATIENT DISPOSITION:  PACU - hemodynamically stable.   Delay start of Pharmacological VTE agent (>24hrs) due to surgical blood loss or risk of bleeding: yes

## 2014-05-29 NOTE — Anesthesia Postprocedure Evaluation (Signed)
  Anesthesia Post-op Note  Patient: Cody Floyd  Procedure(s) Performed: Procedure(s): CENTRAL LUMBAR LAMINECTOMY L3-4 and L4-5 (N/A)  Patient Location: PACU  Anesthesia Type:General  Level of Consciousness: awake and alert   Airway and Oxygen Therapy: Patient Spontanous Breathing  Post-op Pain: mild  Post-op Assessment: Post-op Vital signs reviewed  Post-op Vital Signs: stable  Last Vitals:  Filed Vitals:   05/29/14 1200  BP:   Pulse: 57  Temp: 36.7 C  Resp: 7    Complications: No apparent anesthesia complications

## 2014-05-29 NOTE — Discharge Instructions (Signed)
    No lifting greater than 10 lbs. Avoid bending, stooping and twisting. Walk in house for first week them may start to get out slowly increasing distance up to one mile by 3 weeks post op. Keep incision dry for 3 days, may use tegaderm or similar water impervious dressing.  

## 2014-05-29 NOTE — Anesthesia Procedure Notes (Signed)
Procedure Name: Intubation Date/Time: 05/29/2014 7:36 AM Performed by: Terrill Mohr Pre-anesthesia Checklist: Patient identified, Emergency Drugs available, Suction available and Patient being monitored Patient Re-evaluated:Patient Re-evaluated prior to inductionOxygen Delivery Method: Circle system utilized Preoxygenation: Pre-oxygenation with 100% oxygen Intubation Type: IV induction Ventilation: Mask ventilation without difficulty Laryngoscope Size: Mac and 3 (EMT student) Grade View: Grade II Tube type: Oral Tube size: 7.5 mm Number of attempts: 1 Airway Equipment and Method: Stylet Placement Confirmation: ETT inserted through vocal cords under direct vision,  breath sounds checked- equal and bilateral and positive ETCO2 Secured at: 21 (cm at gum) cm Tube secured with: Tape Dental Injury: Teeth and Oropharynx as per pre-operative assessment

## 2014-05-29 NOTE — Anesthesia Preprocedure Evaluation (Addendum)
Anesthesia Evaluation  Patient identified by MRN, date of birth, ID band Patient awake    Reviewed: Allergy & Precautions, H&P , NPO status , Patient's Chart, lab work & pertinent test results, reviewed documented beta blocker date and time   History of Anesthesia Complications Negative for: history of anesthetic complications  Airway Mallampati: I TM Distance: >3 FB Neck ROM: Full    Dental  (+) Lower Dentures, Upper Dentures, Dental Advisory Given   Pulmonary neg pulmonary ROS,  breath sounds clear to auscultation        Cardiovascular hypertension, Pt. on home beta blockers and Pt. on medications Rhythm:Regular Rate:Normal  Propranolol 5 a.m.   Neuro/Psych    GI/Hepatic GERD-  Medicated and Controlled,  Endo/Other  Morbid obesity  Renal/GU      Musculoskeletal  (+) Arthritis -,   Abdominal (+) + obese,   Peds  Hematology   Anesthesia Other Findings Somewhat receding chin  Reproductive/Obstetrics                         Anesthesia Physical Anesthesia Plan  ASA: II  Anesthesia Plan: General   Post-op Pain Management:    Induction: Intravenous  Airway Management Planned: Oral ETT  Additional Equipment:   Intra-op Plan:   Post-operative Plan: Extubation in OR  Informed Consent: I have reviewed the patients History and Physical, chart, labs and discussed the procedure including the risks, benefits and alternatives for the proposed anesthesia with the patient or authorized representative who has indicated his/her understanding and acceptance.   Dental advisory given  Plan Discussed with: CRNA and Surgeon  Anesthesia Plan Comments:         Anesthesia Quick Evaluation

## 2014-05-30 DIAGNOSIS — M4806 Spinal stenosis, lumbar region: Secondary | ICD-10-CM | POA: Diagnosis not present

## 2014-05-30 LAB — CBC
HEMATOCRIT: 41.3 % (ref 39.0–52.0)
Hemoglobin: 13.7 g/dL (ref 13.0–17.0)
MCH: 28.4 pg (ref 26.0–34.0)
MCHC: 33.2 g/dL (ref 30.0–36.0)
MCV: 85.5 fL (ref 78.0–100.0)
Platelets: 134 10*3/uL — ABNORMAL LOW (ref 150–400)
RBC: 4.83 MIL/uL (ref 4.22–5.81)
RDW: 15.3 % (ref 11.5–15.5)
WBC: 11.7 10*3/uL — AB (ref 4.0–10.5)

## 2014-05-30 LAB — BASIC METABOLIC PANEL
ANION GAP: 11 (ref 5–15)
BUN: 18 mg/dL (ref 6–23)
CHLORIDE: 104 meq/L (ref 96–112)
CO2: 26 mEq/L (ref 19–32)
Calcium: 8.6 mg/dL (ref 8.4–10.5)
Creatinine, Ser: 1.15 mg/dL (ref 0.50–1.35)
GFR calc Af Amer: 71 mL/min — ABNORMAL LOW (ref >90)
GFR calc non Af Amer: 61 mL/min — ABNORMAL LOW (ref >90)
Glucose, Bld: 106 mg/dL — ABNORMAL HIGH (ref 70–99)
POTASSIUM: 4.2 meq/L (ref 3.7–5.3)
Sodium: 141 mEq/L (ref 137–147)

## 2014-05-30 NOTE — Progress Notes (Signed)
Patient ID: Cody Floyd, male   DOB: 1940/12/10, 73 y.o.   MRN: [id number] Subjective: 1 Day Post-Op Procedure(s) (LRB): CENTRAL LUMBAR LAMINECTOMY L3-4 and L4-5 (N/A)  Patient reports pain as mild.    Objective:   VITALS:  Temp:  [97.6 F (36.4 C)-99 F (37.2 C)] 99 F (37.2 C) (10/06 0631) Pulse Rate:  [57-73] 70 (10/06 0631) Resp:  [12] 12 (10/05 1241) BP: (122-148)/(55-62) 148/60 mmHg (10/06 0631) SpO2:  [95 %-96 %] 96 % (10/06 0631)  Neurologically intact ABD soft Neurovascular intact Sensation intact distally Intact pulses distally Dorsiflexion/Plantar flexion intact Incision: dressing C/D/I   LABS  Recent Labs  05/30/14 0539  HGB 13.7  WBC 11.7*  PLT 134*    Recent Labs  05/30/14 0539  NA 141  K 4.2  CL 104  CO2 26  BUN 18  CREATININE 1.15  GLUCOSE 106*   No results found for this basename: LABPT, INR,  in the last 72 hours   Assessment/Plan: 1 Day Post-Op Procedure(s) (LRB): CENTRAL LUMBAR LAMINECTOMY L3-4 and L4-5 (N/A)  Discharge home with home health Walking program at home for exercise. Can use NSAIDs along with narcotics to relieve pain, Ice also important. Return to office in 2 weeks.  Aseel Truxillo E 05/30/2014, 12:22 PM

## 2014-05-30 NOTE — Care Management Note (Signed)
CARE MANAGEMENT NOTE 05/30/2014  Patient:  Cody Floyd, Cody Floyd   Account Number:  [id number]  Date Initiated:  05/30/2014  Documentation initiated by:  Ricki Miller  Subjective/Objective Assessment:   73 yr old male admitted with Severe spinal stenosis. Underwent L3-4, L4-5  lumbar laminectomy.     Action/Plan:   Case manager spoke with patient concerning need for home health physical therapy. Patient states he does not want therapy. Has someone that will be with him.   Anticipated DC Date:  05/30/2014   Anticipated DC Plan:  [facility]  CM consult      [facility]  HOME HEALTH   Choice offered to / List presented to:  C-1 Patient        [place] arranged  [place] - 11 Patient Refused      Status of service:  Completed, signed off Medicare Important Message given?   (If response is "NO", the following Medicare IM given date fields will be blank) Date Medicare IM given:   Medicare IM given by:   Date Additional Medicare IM given:   Additional Medicare IM given by:    Discharge Disposition:  HOME/SELF CARE  Per UR Regulation:  Reviewed for med. necessity/level of care/duration of stay  If discussed at [place] of Stay Meetings, dates discussed:    Comments:

## 2014-05-30 NOTE — Progress Notes (Signed)
Patient ID: Cody Floyd, male   DOB: 27-Apr-1941, 73 y.o.   MRN: [id number] Subjective: 1 Day Post-Op Procedure(s) (LRB): CENTRAL LUMBAR LAMINECTOMY L3-4 and L4-5 (N/A) Alert, oriented x 4, foley discontinued this am. Mild nausea this past evening, no emesis.  Patient reports pain as moderate.    Objective:   VITALS:  Temp:  [97.6 F (36.4 C)-99 F (37.2 C)] 99 F (37.2 C) (10/06 0631) Pulse Rate:  [57-73] 70 (10/06 0631) Resp:  [6-21] 12 (10/05 1241) BP: (122-148)/(55-65) 148/60 mmHg (10/06 0631) SpO2:  [93 %-98 %] 96 % (10/06 0631)  Neurologically intact ABD soft Neurovascular intact Sensation intact distally Intact pulses distally Incision: scant drainage No cellulitis present   LABS  Recent Labs  05/30/14 0539  HGB 13.7  WBC 11.7*  PLT 134*    Recent Labs  05/30/14 0539  NA 141  K 4.2  CL 104  CO2 26  BUN 18  CREATININE 1.15  GLUCOSE 106*   No results found for this basename: LABPT, INR,  in the last 72 hours Patient is just post foley removal. Needs PT/OT eval today.  Assessment/Plan: 1 Day Post-Op Procedure(s) (LRB): CENTRAL LUMBAR LAMINECTOMY L3-4 and L4-5 (N/A)  Advance diet Up with therapy Discharge home with home health He is only starting to stand and PT and OT eval planned for today. If he does well then will D/C to home this afternoon.  Incentive spirometry and cough and deep breath q hour.  Cody Floyd E 05/30/2014, 10:06 AM

## 2014-05-30 NOTE — Evaluation (Signed)
Physical Therapy Evaluation Patient Details Name: Cody Floyd MRN: [id number] DOB: [birth year] Today's Date: 05/30/2014   History of Present Illness  pt presents with L3-5 Lami.    Clinical Impression  Pt indicates he is moving better now than prior to surgery.  Pt still unsteady and requires use of RW and cues for back precautions.  Pt indicates diminished sensation on sole of Bil feet and is unsure if he has neuropathies.  Pt ok for D/C to home from PT stand point.  Will continue to follow if remains on acute.      Follow Up Recommendations Home health PT;Supervision - Intermittent    Equipment Recommendations  None recommended by PT    Recommendations for Other Services       Precautions / Restrictions Precautions Precautions: Back Precaution Booklet Issued: Yes (comment) Restrictions Weight Bearing Restrictions: No      Mobility  Bed Mobility               General bed mobility comments: pt sitting EOB.  Reviewed technique for bed mobility.    Transfers Overall transfer level: Needs assistance Equipment used: Rolling walker (2 wheeled) Transfers: Sit to/from Stand Sit to Stand: Supervision         General transfer comment: cues for UE sue and getting closer to recliner prior to sitting.    Ambulation/Gait Ambulation/Gait assistance: Min guard Ambulation Distance (Feet): 140 Feet Assistive device: Rolling walker (2 wheeled) Gait Pattern/deviations: Step-through pattern;Decreased stride length     General Gait Details: pt moves slowly and with Bil knee instability, that improves with increased distance.  pt indicates he is ambulating better now than prior to surgery.    Stairs Stairs: Yes Stairs assistance: Min guard Stair Management: Two rails;Step to pattern;Forwards Number of Stairs: 2 General stair comments: cues for sequecing gait on stairs.    Wheelchair Mobility    Modified Rankin (Stroke Patients Only)       Balance                                              Pertinent Vitals/Pain Pain Assessment: 0-10 Pain Score: 5  Pain Location: Back Pain Descriptors / Indicators: Aching Pain Intervention(s): Premedicated before session;Repositioned    Home Living Family/patient expects to be discharged to:: Private residence Living Arrangements: Spouse/significant other Available Help at Discharge: Family;Available PRN/intermittently Type of Home: House Home Access: Stairs to enter Entrance Stairs-Rails: Right;Left;Can reach both Entrance Stairs-Number of Steps: 2 Home Layout: One level Home Equipment: Walker - 2 wheels;Grab bars - toilet;Grab bars - tub/shower      Prior Function Level of Independence: Independent with assistive device(s)               Hand Dominance        Extremity/Trunk Assessment   Upper Extremity Assessment: Defer to OT evaluation           Lower Extremity Assessment: Generalized weakness;RLE deficits/detail;LLE deficits/detail      Cervical / Trunk Assessment: Normal  Communication   Communication: No difficulties  Cognition Arousal/Alertness: Awake/alert Behavior During Therapy: WFL for tasks assessed/performed Overall Cognitive Status: Within Functional Limits for tasks assessed                      General Comments      Exercises        Assessment/Plan  PT Assessment Patient needs continued PT services  PT Diagnosis Difficulty walking   PT Problem List Decreased strength;Decreased activity tolerance;Decreased balance;Decreased mobility;Decreased coordination;Decreased knowledge of use of DME;Impaired sensation;Decreased knowledge of precautions;Pain  PT Treatment Interventions DME instruction;Gait training;Stair training;Functional mobility training;Therapeutic activities;Therapeutic exercise;Balance training;Neuromuscular re-education;Patient/family education   PT Goals (Current goals can be found in the Care Plan section) Acute  Rehab PT Goals Patient Stated Goal: Walk without pain PT Goal Formulation: With patient Time For Goal Achievement: 06/06/14 Potential to Achieve Goals: Good    Frequency Min 5X/week   Barriers to discharge        Co-evaluation               End of Session Equipment Utilized During Treatment: Gait belt Activity Tolerance: Patient tolerated treatment well Patient left: in chair;with call bell/phone within reach Nurse Communication: Mobility status    Functional Assessment Tool Used: Clinical Judgement Functional Limitation: Mobility: Walking and moving around Mobility: Walking and Moving Around Current Status (F3545): At least 1 percent but less than 20 percent impaired, limited or restricted Mobility: Walking and Moving Around Goal Status [phone]): 0 percent impaired, limited or restricted    Time: 0837-0908 PT Time Calculation (min): 31 min   Charges:   PT Evaluation $Initial PT Evaluation Tier I: 1 Procedure PT Treatments $Gait Training: 23-37 mins   PT G Codes:   Functional Assessment Tool Used: Clinical Judgement Functional Limitation: Mobility: Walking and moving around    [place]wood[place]ornton Pap[place], [place] [phone] 05/30/2014, 10:36 AM

## 2014-05-30 NOTE — Evaluation (Signed)
Occupational Therapy Evaluation Patient Details Name: Cody Floyd MRN: [id number] DOB: [birth year] Today's Date: 05/30/2014    History of Present Illness pt presents with L3-5 Lami.     Clinical Impression   Pt admitted with the above diagnoses and presents with below problem list. Pt will benefit from continued acute OT to address the below listed deficits and maximize independence with basic ADLs prior to d/c home. PTA pt was mod I for ADLs with occasional use of rw. Currently pt is at supervision-min guard level for most ADLs. Educated pt on techniques and AE for safe completion of ADLs while observing precautions. Also educated on safe technique with rw during functional mobility especially hand placement during sit<>stand.     Follow Up Recommendations  Supervision - Intermittent;No OT follow up    Equipment Recommendations  None recommended by OT;Other (comment) (pt has recommended equipment)    Recommendations for Other Services       Precautions / Restrictions Precautions Precautions: Back Precaution Booklet Issued: Yes (comment) Precaution Comments: reviewed precautions Restrictions Weight Bearing Restrictions: No      Mobility Bed Mobility Overal bed mobility: Needs Assistance Bed Mobility: Rolling;Sidelying to Sit Rolling: Supervision Sidelying to sit: Supervision       General bed mobility comments: cues for technique  Transfers Overall transfer level: Needs assistance Equipment used: Rolling walker (2 wheeled) Transfers: Sit to/from Stand Sit to Stand: Supervision         General transfer comment: cues for technique with rw use especially related to not bearing weight through BUE onto rw during sit<>stand    Balance Overall balance assessment: Needs assistance Sitting-balance support: No upper extremity supported;Feet supported Sitting balance-Leahy Scale: Good     Standing balance support: Bilateral upper extremity supported;During functional  activity Standing balance-Leahy Scale: Fair                              ADL Overall ADL's : Needs assistance/impaired Eating/Feeding: Set up;Sitting   Grooming: Set up;Sitting   Upper Body Bathing: Set up;Sitting;With adaptive equipment   Lower Body Bathing: Sit to/from stand;With adaptive equipment;Supervison/ safety   Upper Body Dressing : Set up;Sitting;With adaptive equipment   Lower Body Dressing: Sit to/from stand;With adaptive equipment;Supervision/safety   Toilet Transfer: Supervision/safety;Ambulation;RW (3n1 over toilet)   Toileting- Clothing Manipulation and Hygiene: Supervision/safety;Sit to/from stand   Tub/ Shower Transfer: Supervision/safety;Ambulation;3 in 1;Rolling walker   Functional mobility during ADLs: Supervision/safety;Rolling walker       Vision                     Perception     Praxis      Pertinent Vitals/Pain Pain Assessment: 0-10 Pain Score:  ("not much pain") Pain Location: Back Pain Descriptors / Indicators: Aching Pain Intervention(s): Limited activity within patient's tolerance;Monitored during session     Hand Dominance     Extremity/Trunk Assessment Upper Extremity Assessment Upper Extremity Assessment: Overall WFL for tasks assessed   Lower Extremity Assessment Lower Extremity Assessment: Defer to PT evaluation Cervical / Trunk Assessment Cervical / Trunk Assessment: Normal   Communication Communication Communication: No difficulties   Cognition Arousal/Alertness: Awake/alert Behavior During Therapy: WFL for tasks assessed/performed Overall Cognitive Status: Within Functional Limits for tasks assessed                     General Comments       Exercises  Shoulder Instructions      Home Living Family/patient expects to be discharged to:: Private residence Living Arrangements: Spouse/significant other Available Help at Discharge: Family;Available PRN/intermittently Type of  Home: House Home Access: Stairs to enter [employer] of Steps: 2 Entrance Stairs-Rails: Right;Left;Can reach both Home Layout: One level     Bathroom Shower/Tub: Occupational psychologist: Standard     Home Equipment: Walker - 2 wheels;Grab bars - toilet;Grab bars - tub/shower;Bedside commode;Adaptive equipment;Hand held shower head Adaptive Equipment: Reacher        Prior Functioning/Environment Level of Independence: Independent with assistive device(s)        Comments: rw as needed    OT Diagnosis: Acute pain   OT Problem List: Impaired balance (sitting and/or standing);Decreased knowledge of use of DME or AE;Decreased knowledge of precautions;Pain   OT Treatment/Interventions: Self-care/ADL training;Therapeutic exercise;DME and/or AE instruction;Therapeutic activities;Patient/family education;Balance training    OT Goals(Current goals can be found in the care plan section) Acute Rehab OT Goals Patient Stated Goal: get back to doing things I was doing before OT Goal Formulation: With patient Time For Goal Achievement: 06/06/14 Potential to Achieve Goals: Good ADL Goals Pt Will Transfer to Toilet: with modified independence;ambulating (3n1 over toilet ) Pt Will Perform Tub/Shower Transfer: with modified independence;ambulating;3 in 1;rolling walker  OT Frequency: Min 2X/week   Barriers to D/C:            Co-evaluation              End of Session Equipment Utilized During Treatment: Gait belt;Rolling walker  Activity Tolerance: Patient tolerated treatment well Patient left: in bed;with call bell/phone within reach   Time: 1100-1120 OT Time Calculation (min): 20 min Charges:  OT General Charges $OT Visit: 1 Procedure OT Evaluation $Initial OT Evaluation Tier I: 1 Procedure OT Treatments $Self Care/Home Management : 8-22 mins G-Codes: OT G-codes **NOT FOR INPATIENT CLASS** Functional Assessment Tool Used: clinical  judgement Functional Limitation: Self care Self Care Current Status (H9977): At least 1 percent but less than 20 percent impaired, limited or restricted Self Care Goal Status (S1423): At least 1 percent but less than 20 percent impaired, limited or restricted Self Care Discharge Status [phone]): At least 1 percent but less than 20 percent impaired, limited or restricted  Hortencia Pilar 05/30/2014, 11:41 AM

## 2014-05-30 NOTE — Progress Notes (Signed)
Patient discharged to home accompanied by family. Discharge instructions, follow-up appointment, and rx given and explained and patient stated understanding. IV was removed and patient left unit in a stable condition with all personal belongings via wheelchair.

## 2014-06-01 ENCOUNTER — Encounter (HOSPITAL_COMMUNITY): Payer: Self-pay | Admitting: Specialist

## 2014-06-16 NOTE — Discharge Summary (Signed)
Physician Discharge Summary  Patient ID: Cody Floyd MRN: [id number] DOB/AGE: 25-Jul-1941 73 y.o.  Admit date: 05/29/2014 Discharge date: 05/30/2014  Admission Diagnoses:  Principal Problem:   Spinal stenosis, lumbar region, with neurogenic claudication Active Problems:   Left lumbar radiculopathy   Discharge Diagnoses:  Same  Past Medical History  Diagnosis Date  . Hypertension   . GERD (gastroesophageal reflux disease)   . Arthritis     Surgeries: Procedure(s): CENTRAL LUMBAR LAMINECTOMY L3-4 and L4-5 on 05/29/2014   Consultants:    Discharged Condition: Improved  Hospital Course: Cody Floyd is an 73 y.o. male who was admitted 05/29/2014 with a chief complaint of No chief complaint on file. , and found to have a diagnosis of Spinal stenosis, lumbar region, with neurogenic claudication.  They were brought to the operating room on 05/29/2014 and underwent the above named procedures.    They were given perioperative antibiotics:  Anti-infectives   Start     Dose/Rate Route Frequency Ordered Stop   05/29/14 1245  ceFAZolin (ANCEF) IVPB 1 g/50 mL premix     1 g 100 mL/hr over 30 Minutes Intravenous Every 8 hours 05/29/14 1238 05/29/14 2144   05/29/14 0600  ceFAZolin (ANCEF) IVPB 2 g/50 mL premix     2 g 100 mL/hr over 30 Minutes Intravenous On call to O.R. 05/28/14 1249 05/29/14 0740    Following surgery he recovered in the PACU and was transferred to Med/Surgery floor [place], POD#1 foley was discontinued. He was able to void without difficulty. PT and OT evaluated his standing and walking tolerance and he was stable and independent In getting up and walking. His incision was dry no erythrema or fluctuance. Vital signs were stable and leg pain preoperatively was Absent. He was discharged home the afternoon of POD#1. They were given sequential compression devices, early ambulation, and no chemoprophylaxis for DVT prophylaxis due to recent CNS surgery.  They benefited  maximally from their hospital stay and there were no complications.    Recent vital signs:  Filed Vitals:   05/30/14 0631  BP: 148/60  Pulse: 70  Temp: 99 F (37.2 C)  Resp:     Recent laboratory studies:  Results for orders placed during the hospital encounter of 05/29/14  CBC      Result Value Ref Range   WBC 11.7 (*) 4.0 - 10.5 K/uL   RBC 4.83  4.22 - 5.81 MIL/uL   Hemoglobin 13.7  13.0 - 17.0 g/dL   HCT 41.3  39.0 - 52.0 %   MCV 85.5  78.0 - 100.0 fL   MCH 28.4  26.0 - 34.0 pg   MCHC 33.2  30.0 - 36.0 g/dL   RDW 15.3  11.5 - 15.5 %   Platelets 134 (*) 150 - 400 K/uL  BASIC METABOLIC PANEL      Result Value Ref Range   Sodium 141  137 - 147 mEq/L   Potassium 4.2  3.7 - 5.3 mEq/L   Chloride 104  96 - 112 mEq/L   CO2 26  19 - 32 mEq/L   Glucose, Bld 106 (*) 70 - 99 mg/dL   BUN 18  6 - 23 mg/dL   Creatinine, Ser 1.15  0.50 - 1.35 mg/dL   Calcium 8.6  8.4 - 10.5 mg/dL   GFR calc non Af Amer 61 (*) >90 mL/min   GFR calc Af Amer 71 (*) >90 mL/min   Anion gap 11  5 - 15  Discharge Medications:     Medication List         esomeprazole 20 MG capsule  Commonly known as:  NEXIUM  Take 20 mg by mouth daily as needed.     oxyCODONE-acetaminophen 5-325 MG per tablet  Commonly known as:  ROXICET  Take 1 tablet by mouth every 4 (four) hours as needed.     propranolol 120 MG 24 hr capsule  Commonly known as:  INNOPRAN XL  Take 120 mg by mouth daily as needed (Pt knows when to take because of how he feels in the morning).        Diagnostic Studies: Dg Chest 2 View  05/24/2014   CLINICAL DATA:  Preoperative for lumbar spine surgery.  EXAM: CHEST  2 VIEW  COMPARISON:  07/22/2010  FINDINGS: The cardiac silhouette, mediastinal and hilar contours are within normal limits and stable. There is tortuosity and calcification of the thoracic aorta. The lungs are clear. No pleural effusion. The bony thorax is intact. Remote healed lower left rib fractures are noted.  IMPRESSION:  No acute cardiopulmonary findings.   Electronically Signed   By: Kalman Jewels M.D.   On: 05/24/2014 14:01   Dg Lumbar Spine 2-3 Views  05/29/2014   CLINICAL DATA:  L3-4 and L4-5 laminectomy.  EXAM: LUMBAR SPINE - 2-3 VIEW  COMPARISON:  MRI lumbar spine 04/21/2014.  FINDINGS: We are provided with 2 intraoperative views of the lumbar spine in the lateral projection. On the first image, probes are at the level of the inferior L3 and L4 pedicles. On the second image, probes are at the level of the L3 and L4 lamina.  IMPRESSION: Localization as above.   Electronically Signed   By: Inge Rise M.D.   On: 05/29/2014 11:02    Disposition: 06-Home-Health Care Svc      Discharge Instructions   Call MD / Call 911    Complete by:  As directed   If you experience chest pain or shortness of breath, CALL 911 and be transported to the hospital emergency room.  If you develope a fever above 101 F, pus (white drainage) or increased drainage or redness at the wound, or calf pain, call your surgeon's office.     Constipation Prevention    Complete by:  As directed   Drink plenty of fluids.  Prune juice may be helpful.  You may use a stool softener, such as Colace (over the counter) 100 mg twice a day.  Use MiraLax (over the counter) for constipation as needed.     Diet - low sodium heart healthy    Complete by:  As directed      Discharge instructions    Complete by:  As directed   No lifting greater than 10 lbs. Avoid bending, stooping and twisting. Walk in house for first week them may start to get out slowly increasing distance up to one half mile by 3 weeks post op. Keep incision dry for 3 days, may use tegaderm or similar water impervious dressing.     Driving restrictions    Complete by:  As directed   No driving for 2 weeks     Increase activity slowly as tolerated    Complete by:  As directed      Lifting restrictions    Complete by:  As directed   No lifting for 6 weeks            Follow-up Information   Follow up with Ky Moskowitz E,  MD In 2 weeks.   Specialty:  Orthopedic Surgery   Contact information:   [address] [place] [address] [phone]        Signed: Jessy Oto 06/16/2014, 6:57 AM

## 2015-01-16 DIAGNOSIS — L570 Actinic keratosis: Secondary | ICD-10-CM | POA: Diagnosis not present

## 2015-01-16 DIAGNOSIS — L578 Other skin changes due to chronic exposure to nonionizing radiation: Secondary | ICD-10-CM | POA: Diagnosis not present

## 2015-02-06 DIAGNOSIS — A630 Anogenital (venereal) warts: Secondary | ICD-10-CM | POA: Diagnosis not present

## 2015-02-06 DIAGNOSIS — N508 Other specified disorders of male genital organs: Secondary | ICD-10-CM | POA: Diagnosis not present

## 2015-03-08 DIAGNOSIS — L570 Actinic keratosis: Secondary | ICD-10-CM | POA: Diagnosis not present

## 2015-05-31 DIAGNOSIS — L578 Other skin changes due to chronic exposure to nonionizing radiation: Secondary | ICD-10-CM | POA: Diagnosis not present

## 2015-05-31 DIAGNOSIS — L570 Actinic keratosis: Secondary | ICD-10-CM | POA: Diagnosis not present

## 2015-05-31 DIAGNOSIS — L821 Other seborrheic keratosis: Secondary | ICD-10-CM | POA: Diagnosis not present

## 2015-06-08 DIAGNOSIS — Z23 Encounter for immunization: Secondary | ICD-10-CM | POA: Diagnosis not present

## 2015-11-26 DIAGNOSIS — L570 Actinic keratosis: Secondary | ICD-10-CM | POA: Diagnosis not present

## 2015-11-27 DIAGNOSIS — H402221 Chronic angle-closure glaucoma, left eye, mild stage: Secondary | ICD-10-CM | POA: Diagnosis not present

## 2015-11-27 DIAGNOSIS — H2513 Age-related nuclear cataract, bilateral: Secondary | ICD-10-CM | POA: Diagnosis not present

## 2015-11-27 DIAGNOSIS — H402211 Chronic angle-closure glaucoma, right eye, mild stage: Secondary | ICD-10-CM | POA: Diagnosis not present

## 2015-11-27 DIAGNOSIS — H04123 Dry eye syndrome of bilateral lacrimal glands: Secondary | ICD-10-CM | POA: Diagnosis not present

## 2015-11-27 DIAGNOSIS — H401190 Primary open-angle glaucoma, unspecified eye, stage unspecified: Secondary | ICD-10-CM | POA: Diagnosis not present

## 2015-12-12 DIAGNOSIS — M5440 Lumbago with sciatica, unspecified side: Secondary | ICD-10-CM | POA: Diagnosis not present

## 2015-12-12 DIAGNOSIS — R252 Cramp and spasm: Secondary | ICD-10-CM | POA: Diagnosis not present

## 2015-12-12 DIAGNOSIS — R7303 Prediabetes: Secondary | ICD-10-CM | POA: Diagnosis not present

## 2015-12-12 DIAGNOSIS — I10 Essential (primary) hypertension: Secondary | ICD-10-CM | POA: Diagnosis not present

## 2015-12-12 DIAGNOSIS — H402221 Chronic angle-closure glaucoma, left eye, mild stage: Secondary | ICD-10-CM | POA: Diagnosis not present

## 2015-12-12 DIAGNOSIS — H402211 Chronic angle-closure glaucoma, right eye, mild stage: Secondary | ICD-10-CM | POA: Diagnosis not present

## 2015-12-12 DIAGNOSIS — M47816 Spondylosis without myelopathy or radiculopathy, lumbar region: Secondary | ICD-10-CM | POA: Diagnosis not present

## 2015-12-12 DIAGNOSIS — N400 Enlarged prostate without lower urinary tract symptoms: Secondary | ICD-10-CM | POA: Diagnosis not present

## 2015-12-20 DIAGNOSIS — Z0000 Encounter for general adult medical examination without abnormal findings: Secondary | ICD-10-CM | POA: Diagnosis not present

## 2015-12-20 DIAGNOSIS — I10 Essential (primary) hypertension: Secondary | ICD-10-CM | POA: Diagnosis not present

## 2015-12-20 DIAGNOSIS — R739 Hyperglycemia, unspecified: Secondary | ICD-10-CM | POA: Diagnosis not present

## 2015-12-20 DIAGNOSIS — N400 Enlarged prostate without lower urinary tract symptoms: Secondary | ICD-10-CM | POA: Diagnosis not present

## 2015-12-20 DIAGNOSIS — R109 Unspecified abdominal pain: Secondary | ICD-10-CM | POA: Diagnosis not present

## 2015-12-24 ENCOUNTER — Other Ambulatory Visit: Payer: Self-pay | Admitting: Internal Medicine

## 2015-12-24 DIAGNOSIS — R29898 Other symptoms and signs involving the musculoskeletal system: Secondary | ICD-10-CM

## 2016-01-05 ENCOUNTER — Inpatient Hospital Stay: Admission: RE | Admit: 2016-01-05 | Payer: Self-pay | Source: Ambulatory Visit

## 2016-01-07 DIAGNOSIS — L570 Actinic keratosis: Secondary | ICD-10-CM | POA: Diagnosis not present

## 2016-01-07 DIAGNOSIS — L821 Other seborrheic keratosis: Secondary | ICD-10-CM | POA: Diagnosis not present

## 2016-01-10 ENCOUNTER — Other Ambulatory Visit: Payer: Self-pay | Admitting: Rheumatology

## 2016-01-10 DIAGNOSIS — M4807 Spinal stenosis, lumbosacral region: Secondary | ICD-10-CM

## 2016-01-10 DIAGNOSIS — R748 Abnormal levels of other serum enzymes: Secondary | ICD-10-CM | POA: Diagnosis not present

## 2016-01-10 DIAGNOSIS — M179 Osteoarthritis of knee, unspecified: Secondary | ICD-10-CM | POA: Diagnosis not present

## 2016-01-10 DIAGNOSIS — R270 Ataxia, unspecified: Secondary | ICD-10-CM | POA: Diagnosis not present

## 2016-01-10 DIAGNOSIS — M1612 Unilateral primary osteoarthritis, left hip: Secondary | ICD-10-CM | POA: Diagnosis not present

## 2016-01-10 DIAGNOSIS — M1990 Unspecified osteoarthritis, unspecified site: Secondary | ICD-10-CM | POA: Diagnosis not present

## 2016-01-12 ENCOUNTER — Other Ambulatory Visit: Payer: Self-pay

## 2016-01-14 ENCOUNTER — Ambulatory Visit
Admission: RE | Admit: 2016-01-14 | Discharge: 2016-01-14 | Disposition: A | Discharge status: Home / Self Care | Payer: Medicare Other | Source: Ambulatory Visit | Attending: Rheumatology | Admitting: Rheumatology

## 2016-01-14 DIAGNOSIS — M4806 Spinal stenosis, lumbar region: Secondary | ICD-10-CM | POA: Diagnosis not present

## 2016-01-14 DIAGNOSIS — M4807 Spinal stenosis, lumbosacral region: Secondary | ICD-10-CM

## 2016-01-22 ENCOUNTER — Encounter: Payer: Self-pay | Admitting: *Deleted

## 2016-01-23 ENCOUNTER — Ambulatory Visit (INDEPENDENT_AMBULATORY_CARE_PROVIDER_SITE_OTHER): Payer: Medicare Other | Admitting: Diagnostic Neuroimaging

## 2016-01-23 ENCOUNTER — Encounter: Payer: Self-pay | Admitting: Diagnostic Neuroimaging

## 2016-01-23 VITALS — BP 137/73 | HR 65 | Ht 73.0 in | Wt 240.8 lb

## 2016-01-23 DIAGNOSIS — M25561 Pain in right knee: Secondary | ICD-10-CM

## 2016-01-23 DIAGNOSIS — R200 Anesthesia of skin: Secondary | ICD-10-CM

## 2016-01-23 DIAGNOSIS — M25562 Pain in left knee: Secondary | ICD-10-CM | POA: Diagnosis not present

## 2016-01-23 DIAGNOSIS — M545 Low back pain, unspecified: Secondary | ICD-10-CM

## 2016-01-23 DIAGNOSIS — R269 Unspecified abnormalities of gait and mobility: Secondary | ICD-10-CM

## 2016-01-23 NOTE — Patient Instructions (Signed)
Thank you for coming to see [place] at [facility]. I hope we have been able to provide you high quality care today.  You may receive a patient satisfaction survey over the next few weeks. We would appreciate your feedback and comments so that we may continue to improve ourselves and the health of our patients.  - I will check MRI lumbar spine and labs - try physical therapy   ~~~~~~~~~~~~~~~~~~~~~~~~~~~~~~~~~~~~~~~~~~~~~~~~~~~~~~~~~~~~~~~~~  DR. PENUMALLI'S GUIDE TO HAPPY AND HEALTHY LIVING These are some of my general health and wellness recommendations. Some of them may apply to you better than others. Please use common sense as you try these suggestions and feel free to ask me any questions.   ACTIVITY/FITNESS Mental, social, emotional and physical stimulation are very important for brain and body health. Try learning a new activity (arts, music, language, sports, games).  Keep moving your body to the best of your abilities. You can do this at home, inside or outside, the park, community center, gym or anywhere you like. Consider a physical therapist or personal trainer to get started. Consider the app Sworkit. Fitness trackers such as smart-watches, smart-phones or Fitbits can help as well.   NUTRITION Eat more plants: colorful vegetables, nuts, seeds and berries.  Eat less sugar, salt, preservatives and processed foods.  Avoid toxins such as cigarettes and alcohol.  Drink water when you are thirsty. Warm water with a slice of lemon is an excellent morning drink to start the day.  Consider these websites for more information The Nutrition Source (https://www.henry-hernandez.biz/) Precision Nutrition (WindowBlog.ch)   RELAXATION Consider practicing mindfulness meditation or other relaxation techniques such as deep breathing, prayer, yoga, tai chi, massage. See website mindful.org or the apps Headspace or Calm to help get  started.   SLEEP Try to get at least 7-8+ hours sleep per day. Regular exercise and reduced caffeine will help you sleep better. Practice good sleep hygeine techniques. See website sleep.org for more information.   PLANNING Prepare estate planning, living will, healthcare POA documents. Sometimes this is best planned with the help of an attorney. Theconversationproject.org and agingwithdignity.org are excellent resources.

## 2016-01-23 NOTE — Progress Notes (Signed)
[facility]  PATIENT: Cody Floyd DOB: Oct 29, 1940  REFERRING CLINICIAN: Arlyce Harman  HISTORY FROM: patient  REASON FOR VISIT: new consult    HISTORICAL  CHIEF COMPLAINT:  Chief Complaint  Patient presents with  . Ataxia    rm 6, New Pt, "began after back surgery 2015, got worse over time, comes and goes"    HISTORY OF PRESENT ILLNESS:   75 year old male here for evaluation of balance and gait difficulty. October 2015 patient had lumbar spine surgery due to low back pain radiating to left leg. Symptoms slightly improved but then returned. In December 2015 patient felt increasing balance and gait difficulty. Symptoms seem to fluctuate and were intermittent. These fluctuation and balance difficulty continued until March 2017. At this time he had significant increased back pain especially with twisting movements. Patient having more balance and walking problems especially if he walks longer distances. Patient was also found to have elevated CK levels, saw a rheumatologist Dr. Dossie Der, who then referred patient to me for further evaluation of ataxia.  Patient denies any problems with his fingers, hands or neck. No headache or vision changes.    REVIEW OF SYSTEMS: Full 14 system review of systems performed and negative with exception of: Chills fatigue hearing loss eye pain service of breath cough feeling cold easy bruising memory loss numbness weakness restless legs anxiety decreased energy disinterest in activities joint pain joint swelling cramps aching muscles urination problems feeling cold cough.   ALLERGIES: No Known Allergies  HOME MEDICATIONS: Outpatient Prescriptions Prior to Visit  Medication Sig Dispense Refill  . esomeprazole (NEXIUM) 20 MG capsule Take 20 mg by mouth daily as needed.    . propranolol (INNOPRAN XL) 120 MG 24 hr capsule Take 120 mg by mouth daily as needed (Pt knows when to take because of how he feels in the morning).    [name]  oxyCODONE-acetaminophen (ROXICET) 5-325 MG per tablet Take 1 tablet by mouth every 4 (four) hours as needed. 60 tablet 0   No facility-administered medications prior to visit.    PAST MEDICAL HISTORY: Past Medical History  Diagnosis Date  . Hypertension   . GERD (gastroesophageal reflux disease)   . Arthritis   . Hyperlipidemia   . History of BPH   . Ventral hernia   . RLS (restless legs syndrome)   . Vertigo     PAST SURGICAL HISTORY: Past Surgical History  Procedure Laterality Date  . Back surgery    . Hammer toe surgery Left 2012  . Lumbar laminectomy/decompression microdiscectomy N/A 05/29/2014    Procedure: CENTRAL LUMBAR LAMINECTOMY L3-4 and L4-5;  Surgeon: Jessy Oto, MD;  Location: [facility];  Service: Orthopedics;  Laterality: N/A;    FAMILY HISTORY: Family History  Problem Relation Age of Onset  . Osteoarthritis Mother     SOCIAL HISTORY:  Social History   Social History  . Marital Status: Single    Spouse Name: N/A  . Number of Children: 0  . Years of Education: 8   Occupational History  .      retired from [employer], Clinical biochemist   Social History Main Topics  . Smoking status: Never Smoker   . Smokeless tobacco: Never Used  . Alcohol Use: No     Comment: 01/23/16 couple beers a week  . Drug Use: No  . Sexual Activity: Not on file   Other Topics Concern  . Not on file   Social History Narrative   Lives with spouse  Caffeine use- coffee all day     PHYSICAL EXAM  GENERAL EXAM/CONSTITUTIONAL: Vitals:  Filed Vitals:   01/23/16 1107  BP: 137/73  Pulse: 65  Height: 6\' 1"  (1.854 m)  Weight: 240 lb 12.8 oz (109.226 kg)     Body mass index is 31.78 kg/(m^2).  Visual Acuity Screening   Right eye Left eye Both eyes  Without correction: 20/30 20/30   With correction:        Patient is in no distress; well developed, nourished and groomed; neck is supple  CARDIOVASCULAR:  Examination of carotid arteries is normal; no carotid  bruits  Regular rate and rhythm, no murmurs  Examination of peripheral vascular system by observation and palpation is normal  EYES:  Ophthalmoscopic exam of optic discs and posterior segments is normal; no papilledema or hemorrhages  MUSCULOSKELETAL:  Gait, strength, tone, movements noted in Neurologic exam below  NEUROLOGIC: MENTAL STATUS:  No flowsheet data found.  awake, alert, oriented to person, place and time  recent and remote memory intact  normal attention and concentration  language fluent, comprehension intact, naming intact,   fund of knowledge appropriate  CRANIAL NERVE:   2nd - no papilledema on fundoscopic exam  2nd, 3rd, 4th, 6th - pupils equal and reactive to light, visual fields full to confrontation, extraocular muscles intact, no nystagmus  5th - facial sensation symmetric  7th - facial strength symmetric  8th - hearing intact  9th - palate elevates symmetrically, uvula midline  11th - shoulder shrug symmetric  12th - tongue protrusion midline  MOTOR:   normal bulk and tone, full strength in the BUE, BLE  SENSORY:   normal and symmetric to light touch, temperature, vibration; DECR PP IN FEET AND LEGS BELOW KNEES  COORDINATION:   finger-nose-finger, fine finger movements normal  REFLEXES:   deep tendon reflexes TRACE and symmetric; ABSENT AT ANKLES  GAIT/STATION:   narrow based gait; ANTALGIC, SLOW CAUTIOUS GAIT; UNSTEADY    DIAGNOSTIC DATA (LABS, IMAGING, TESTING) - I reviewed patient records, labs, notes, testing and imaging myself where available.  Lab Results  Component Value Date   WBC 11.7* 05/30/2014   HGB 13.7 05/30/2014   HCT 41.3 05/30/2014   MCV 85.5 05/30/2014   PLT 134* 05/30/2014      Component Value Date/Time   NA 141 05/30/2014 0539   K 4.2 05/30/2014 0539   CL 104 05/30/2014 0539   CO2 26 05/30/2014 0539   GLUCOSE 106* 05/30/2014 0539   BUN 18 05/30/2014 0539   CREATININE 1.15 05/30/2014 0539    CALCIUM 8.6 05/30/2014 0539   PROT 7.3 05/24/2014 1236   ALBUMIN 4.2 05/24/2014 1236   AST 29 05/24/2014 1236   ALT 32 05/24/2014 1236   ALKPHOS 73 05/24/2014 1236   BILITOT 0.8 05/24/2014 1236   GFRNONAA 61* 05/30/2014 0539   GFRAA 71* 05/30/2014 0539   No results found for: CHOL, HDL, LDLCALC, LDLDIRECT, TRIG, CHOLHDL No results found for: HGBA1C No results found for: VITAMINB12 No results found for: TSH  05/24/14 EKG [I reviewed images myself and agree with interpretation. -VRP]  - sinus bradycardia with 1st degree AV block  04/21/14 MRI lumbar spine [I reviewed images myself and agree with interpretation. -VRP]  1. Moderate to severe multifactorial spinal stenosis at L3-4 with mild lateral recess stenosis bilaterally.  2. Severe multifactorial spinal stenosis at L4-5. There is significant lateral recess stenosis with mild right greater than left foraminal stenosis. 3. Postsurgical changes on the right at  L5-S1 with chronic disc degeneration and osteophytes contributing to mild biforaminal stenosis, but no definite nerve root encroachment.  01/14/16 CT lumbar spine [I reviewed images myself and agree with interpretation. -VRP]  - L1-2 multifactorial mild spinal stenosis. - L2-3 multifactorial mild spinal stenosis and mild bilateral lateral recess stenosis (greater on left). - L3-4 prior posterior decompression. Multifactorial right greater than left foraminal narrowing and slight encroachment upon the exiting right L3 nerve root. Multifactorial moderate canal narrowing most notable just above the disc space. - L4-5 prior posterior decompression. Prominent facet degenerative changes and bony overgrowth. 7 mm anterior slip L4. Multifactorial moderate to marked bilateral foraminal narrowing and encroachment upon the exiting L4 nerve roots. No significant canal narrowing although evaluation limited by postop changes. - L5-S1 prior right hemilaminectomy. Multifactorial mild to moderate  bilateral foraminal narrowing. Central spur with mild bilateral lateral recess stenosis and minimal indentation ventral thecal sac. - Calcified ectatic/dilated aorta and iliac arteries incompletely assessed on present exam.     ASSESSMENT AND PLAN  75 y.o. year old male here with history of lumbar spinal stenosis, status post decompression surgery in 2015, now with increasing neurogenic claudication. Most like her episodes progression of lumbar spinal stenosis disease. We'll check additional testing with MRI of the lumbar spine. Will also check neuropathy screening labs and refer patient to physical therapy.   Ddx of balance difficulty: neuropathy, lumbar radiculopathy, lumbar spinal stenosis with neurogenic claudication  1. Numbness   2. Arthralgia of both lower legs   3. Bilateral low back pain without sciatica   4. Gait difficulty     PLAN: - check additional workup - refer to physical therapy - may consider referral back to spine surgeon (Dr. Louanne Skye) after MRI results  - caution with balance and walking; use cane or other assistive device  Orders Placed This Encounter  Procedures  . MR Lumbar Spine W Wo Contrast  . Hemoglobin A1c  . Vitamin B12  . TSH  . Ambulatory referral to Physical Therapy   Return in about 3 months (around 04/24/2016).    Penni Bombard, MD [id number], [id number] AM Certified in Neurology, Neurophysiology and Neuroimaging  [facility] [address], [place], [place] [address] [phone]

## 2016-01-24 ENCOUNTER — Telehealth: Payer: Self-pay | Admitting: *Deleted

## 2016-01-24 LAB — HEMOGLOBIN A1C
ESTIMATED AVERAGE GLUCOSE: 128 mg/dL
HEMOGLOBIN A1C: 6.1 % — AB (ref 4.8–5.6)

## 2016-01-24 LAB — VITAMIN B12: VITAMIN B 12: 291 pg/mL (ref 211–946)

## 2016-01-24 LAB — TSH: TSH: 1.76 u[IU]/mL (ref 0.450–4.500)

## 2016-01-24 NOTE — Telephone Encounter (Signed)
LVM informing patient his lab results showed normal Vit B12, normal thyroid, but his Hgb A1c is slightly elevated. Informed dhim this checks his blood sugar levels over past 2-3 months. Advised he follow up with his PCP. Advised he watch his diet re: sugars, carbohydrates, starches, control his weight and get exercise. Left name, number for questions.

## 2016-01-31 ENCOUNTER — Ambulatory Visit
Admission: RE | Admit: 2016-01-31 | Discharge: 2016-01-31 | Disposition: A | Discharge status: Home / Self Care | Payer: Medicare Other | Source: Ambulatory Visit | Attending: Diagnostic Neuroimaging | Admitting: Diagnostic Neuroimaging

## 2016-01-31 DIAGNOSIS — R200 Anesthesia of skin: Secondary | ICD-10-CM | POA: Diagnosis not present

## 2016-01-31 DIAGNOSIS — M25562 Pain in left knee: Secondary | ICD-10-CM

## 2016-01-31 DIAGNOSIS — M545 Low back pain, unspecified: Secondary | ICD-10-CM

## 2016-01-31 DIAGNOSIS — M4806 Spinal stenosis, lumbar region: Secondary | ICD-10-CM | POA: Diagnosis not present

## 2016-01-31 DIAGNOSIS — M25561 Pain in right knee: Secondary | ICD-10-CM

## 2016-01-31 MED ORDER — GADOBENATE DIMEGLUMINE 529 MG/ML IV SOLN
20.0000 mL | Freq: Once | INTRAVENOUS | Status: AC | PRN
  Administered 2016-01-31: 20 mL via INTRAVENOUS

## 2016-02-08 DIAGNOSIS — R270 Ataxia, unspecified: Secondary | ICD-10-CM | POA: Diagnosis not present

## 2016-02-08 DIAGNOSIS — M1990 Unspecified osteoarthritis, unspecified site: Secondary | ICD-10-CM | POA: Diagnosis not present

## 2016-02-08 DIAGNOSIS — R748 Abnormal levels of other serum enzymes: Secondary | ICD-10-CM | POA: Diagnosis not present

## 2016-02-08 DIAGNOSIS — M4807 Spinal stenosis, lumbosacral region: Secondary | ICD-10-CM | POA: Diagnosis not present

## 2016-03-12 ENCOUNTER — Other Ambulatory Visit: Payer: Self-pay | Admitting: Neurosurgery

## 2016-03-12 DIAGNOSIS — M4310 Spondylolisthesis, site unspecified: Secondary | ICD-10-CM | POA: Diagnosis not present

## 2016-03-19 ENCOUNTER — Ambulatory Visit
Admission: RE | Admit: 2016-03-19 | Discharge: 2016-03-19 | Disposition: A | Discharge status: Home / Self Care | Payer: Medicare Other | Source: Ambulatory Visit | Attending: Neurosurgery | Admitting: Neurosurgery

## 2016-03-19 DIAGNOSIS — M545 Low back pain: Secondary | ICD-10-CM | POA: Diagnosis not present

## 2016-03-19 DIAGNOSIS — M4310 Spondylolisthesis, site unspecified: Secondary | ICD-10-CM

## 2016-03-19 MED ORDER — METHYLPREDNISOLONE ACETATE 40 MG/ML INJ SUSP (RADIOLOG
120.0000 mg | Freq: Once | INTRAMUSCULAR | Status: AC
  Administered 2016-03-19: 120 mg via EPIDURAL

## 2016-03-19 MED ORDER — IOPAMIDOL (ISOVUE-M 200) INJECTION 41%
1.0000 mL | Freq: Once | INTRAMUSCULAR | Status: AC
  Administered 2016-03-19: 1 mL via EPIDURAL

## 2016-03-19 NOTE — Discharge Instructions (Signed)

## 2016-04-29 ENCOUNTER — Ambulatory Visit: Payer: Self-pay | Admitting: Diagnostic Neuroimaging

## 2016-04-30 ENCOUNTER — Encounter: Payer: Self-pay | Admitting: Diagnostic Neuroimaging

## 2016-06-12 DIAGNOSIS — Z23 Encounter for immunization: Secondary | ICD-10-CM | POA: Diagnosis not present

## 2016-07-07 DIAGNOSIS — L570 Actinic keratosis: Secondary | ICD-10-CM | POA: Diagnosis not present

## 2016-07-07 DIAGNOSIS — L578 Other skin changes due to chronic exposure to nonionizing radiation: Secondary | ICD-10-CM | POA: Diagnosis not present

## 2016-08-28 DIAGNOSIS — J209 Acute bronchitis, unspecified: Secondary | ICD-10-CM | POA: Diagnosis not present

## 2016-09-22 ENCOUNTER — Ambulatory Visit (INDEPENDENT_AMBULATORY_CARE_PROVIDER_SITE_OTHER): Payer: Medicare Other

## 2016-09-22 ENCOUNTER — Encounter (INDEPENDENT_AMBULATORY_CARE_PROVIDER_SITE_OTHER): Payer: Self-pay | Admitting: Orthopedic Surgery

## 2016-09-22 ENCOUNTER — Ambulatory Visit (INDEPENDENT_AMBULATORY_CARE_PROVIDER_SITE_OTHER): Payer: Medicare Other | Admitting: Orthopedic Surgery

## 2016-09-22 DIAGNOSIS — M25562 Pain in left knee: Secondary | ICD-10-CM | POA: Diagnosis not present

## 2016-09-22 MED ORDER — BUPIVACAINE HCL 0.25 % IJ SOLN
4.0000 mL | INTRAMUSCULAR | Status: AC | PRN
  Administered 2016-09-22: 4 mL via INTRA_ARTICULAR

## 2016-09-22 MED ORDER — LIDOCAINE HCL 1 % IJ SOLN
5.0000 mL | INTRAMUSCULAR | Status: AC | PRN
  Administered 2016-09-22: 5 mL

## 2016-09-22 MED ORDER — METHYLPREDNISOLONE ACETATE 40 MG/ML IJ SUSP
40.0000 mg | INTRAMUSCULAR | Status: AC | PRN
  Administered 2016-09-22: 40 mg via INTRA_ARTICULAR

## 2016-09-22 NOTE — Progress Notes (Signed)
Office Visit Note   Patient: Cody Floyd           Date of Birth: [birth year]           MRN: [id number] Visit Date: 09/22/2016 Requested by: No referring provider defined for this encounter. PCP: Pcp Not In System  Subjective: Chief Complaint  Patient presents with  . Left Knee - Pain    HPI Cody Floyd is a 76 year old patient with left knee pain 3 weeks duration.  Denies any history of discrete trauma.  Has known history of arthritis in the knees.  Internal rotation causes the most pain which he localizes to the medial side.  Uses a walker at night.  He feels like the knee will "give out".  He is not taking any medications for the pain.  He has no prior surgery.              Review of Systems All systems reviewed are negative as they relate to the chief complaint within the history of present illness.  Patient denies  fevers or chills.    Assessment & Plan: Visit Diagnoses:  1. Acute pain of left knee     Plan: Impression is acute flare left knee with known history of arthritis present.  He may of aggravated pre-existing arthritis or potentially torn and already degenerated meniscal cartilage.  Plan is injection into the left knee.  This is done under sterile guidance and prep.  I'll see him back as needed  Follow-Up Instructions: No Follow-up on file.   Orders:  Orders Placed This Encounter  Procedures  . XR KNEE 3 VIEW LEFT   No orders of the defined types were placed in this encounter.     Procedures: Large Joint Inj Date/Time: 09/22/2016 5:25 PM Performed by: Meredith Pel Authorized by: Meredith Pel   Consent Given by:  Patient Site marked: the procedure site was marked   Timeout: prior to procedure the correct patient, procedure, and site was verified   Indications:  Pain, joint swelling and diagnostic evaluation Location:  Knee Site:  L knee Prep: patient was prepped and draped in usual sterile fashion   Needle Size:  18 G Needle Length:  1.5  inches Approach:  Superolateral Ultrasound Guidance: No   Fluoroscopic Guidance: No   Arthrogram: No   Medications:  5 mL lidocaine 1 %; 4 mL bupivacaine 0.25 %; 40 mg methylPREDNISolone acetate 40 MG/ML Patient tolerance:  Patient tolerated the procedure well with no immediate complications     Clinical Data: No additional findings.  Objective: Vital Signs: There were no vitals taken for this visit.  Physical Exam   Constitutional: Patient appears well-developed HEENT:  Head: Normocephalic Eyes:EOM are normal Neck: Normal range of motion Cardiovascular: Normal rate Pulmonary/chest: Effort normal Neurologic: Patient is alert Skin: Skin is warm Psychiatric: Patient has normal mood and affect    Ortho ExamLeft knee exam demonstrates mild varus alignment palpable pedal pulses flexion contracture bilaterally of about 5.  Extensor mechanism is intact.  No other masses lymph and every changes in the left knee region.  Flexion is past 90 easily.  Collaterals and cruciates are stable.  Extensor mechanism is intact.  He has medial greater than lateral joint line tenderness.    Specialty Comments:  No specialty comments available.  Imaging: Xr Knee 3 View Left  Result Date: 09/22/2016 3 views left knee reviewed.  AP lateral and merchant view.  Patient has endstage arthritis in both knees medial compartment  were some lateral compartment.  No other loose bodies present within the knee joint.  No effusion is present.  Patient has mild varus alignment.    PMFS History: Patient Active Problem List   Diagnosis Date Noted  . Spinal stenosis, lumbar region, with neurogenic claudication 05/29/2014    Class: Chronic  . Left lumbar radiculopathy 05/29/2014    Class: Chronic   Past Medical History:  Diagnosis Date  . Arthritis   . GERD (gastroesophageal reflux disease)   . History of BPH   . Hyperlipidemia   . Hypertension   . RLS (restless legs syndrome)   . Ventral hernia     . Vertigo     Family History  Problem Relation Age of Onset  . Osteoarthritis Mother     Past Surgical History:  Procedure Laterality Date  . BACK SURGERY    . HAMMER TOE SURGERY Left 2012  . LUMBAR LAMINECTOMY/DECOMPRESSION MICRODISCECTOMY N/A 05/29/2014   Procedure: CENTRAL LUMBAR LAMINECTOMY L3-4 and L4-5;  Surgeon: Jessy Oto, MD;  Location: [facility];  Service: Orthopedics;  Laterality: N/A;   Social History   Occupational History  .      retired from [employer], Clinical biochemist   Social History Main Topics  . Smoking status: Never Smoker  . Smokeless tobacco: Never Used  . Alcohol use No     Comment: 01/23/16 couple beers a week  . Drug use: No  . Sexual activity: Not on file

## 2016-11-13 DIAGNOSIS — H40053 Ocular hypertension, bilateral: Secondary | ICD-10-CM | POA: Diagnosis not present

## 2016-11-13 DIAGNOSIS — H402211 Chronic angle-closure glaucoma, right eye, mild stage: Secondary | ICD-10-CM | POA: Diagnosis not present

## 2016-11-13 DIAGNOSIS — H402222 Chronic angle-closure glaucoma, left eye, moderate stage: Secondary | ICD-10-CM | POA: Diagnosis not present

## 2016-11-18 DIAGNOSIS — H40053 Ocular hypertension, bilateral: Secondary | ICD-10-CM | POA: Diagnosis not present

## 2016-11-18 DIAGNOSIS — H402222 Chronic angle-closure glaucoma, left eye, moderate stage: Secondary | ICD-10-CM | POA: Diagnosis not present

## 2016-11-19 ENCOUNTER — Ambulatory Visit (INDEPENDENT_AMBULATORY_CARE_PROVIDER_SITE_OTHER): Payer: Medicare Other | Admitting: Orthopedic Surgery

## 2016-11-19 ENCOUNTER — Encounter (INDEPENDENT_AMBULATORY_CARE_PROVIDER_SITE_OTHER): Payer: Self-pay | Admitting: Orthopedic Surgery

## 2016-11-19 DIAGNOSIS — M25511 Pain in right shoulder: Secondary | ICD-10-CM | POA: Diagnosis not present

## 2016-11-19 DIAGNOSIS — M25562 Pain in left knee: Secondary | ICD-10-CM | POA: Diagnosis not present

## 2016-11-19 NOTE — Progress Notes (Signed)
Office Visit Note   Patient: Cody Floyd           Date of Birth: [birth year]           MRN: [id number] Visit Date: 11/19/2016 Requested by: No referring provider defined for this encounter. PCP: Pcp Not In System  Subjective: Chief Complaint  Patient presents with  . Left Knee - Follow-up  . Right Shoulder - Pain    HPI: Cody Floyd is a 3 show patient with left knee pain in the right shoulder pain.  When I saw him last 09/22/2016 and injected the left knee.  They gave him good relief for a week but then the pain is recurred.  Reports mostly medial sided pain.  Patient also describes Right shoulder pain which she localizes discretely to the proximal aspect of the anterior biceps.  Doesn't really have much weakness and denies a history of injury.  This is been going on for 2 weeks.  Denies a numbness and tingling or any radicular-type symptoms.  Does report pain with lifting or putting a coffee cup to his mouth.              ROS: All systems reviewed are negative as they relate to the chief complaint within the history of present illness.  Patient denies  fevers or chills.   Assessment & Plan: Visit Diagnoses:  1. Left knee pain, unspecified chronicity   2. Acute pain of right shoulder     Plan: Impression is left knee pain and end-stage arthritis in a patient who doesn't want to get total knee replacement.  That would be the definitive solution to this problem.  For now are going to try ace gel injection see if that can help with his symptoms.  To get that preapproved and see him back for a planned injection in 7-10 days.  In regards to his shoulder it does not look like he has a Popeye deformity yet but he may be heading in that direction.  Cuff strength is reasonable there is no limitation of shoulder range of motion.  I would favor observation for that right shoulder.  Follow-Up Instructions: Return in about 9 years (around 11/19/2025).   Orders:  No orders of the defined types were  placed in this encounter.  No orders of the defined types were placed in this encounter.     Procedures: No procedures performed   Clinical Data: No additional findings.  Objective: Vital Signs: There were no vitals taken for this visit.  Physical Exam:   Constitutional: Patient appears well-developed HEENT:  Head: Normocephalic Eyes:EOM are normal Neck: Normal range of motion Cardiovascular: Normal rate Pulmonary/chest: Effort normal Neurologic: Patient is alert Skin: Skin is warm Psychiatric: Patient has normal mood and affect    Ortho Exam: Orthopedic exam demonstrates on that left knee that S8 to 9 flexion contracture no effusion intact since mechanism medial joint line tenderness to direct palpation stable collateral cruciate ligaments mild venous stasis changes in the bilateral lower chemise but with palpable pedal pulses no groin pain with internal/external rotation of the leg.  No other masses lymph and after skin changes noted in the left knee region.  Examination of the right shoulder demonstrates maintained passive and active range of motion with good rotator cuff strength isolated interspace supraspinatus and subscap muscle testing.  No acromioclavicular joint tenderness to direct palpation.  Does have tenderness directly over the proximal aspect of the biceps in the mid arm level.  No Popeye deformity  is present.  Strength is intact both to supination as well as elbow flexion on the right.  Specialty Comments:  No specialty comments available.  Imaging: No results found.   PMFS History: Patient Active Problem List   Diagnosis Date Noted  . Spinal stenosis, lumbar region, with neurogenic claudication 05/29/2014    Class: Chronic  . Left lumbar radiculopathy 05/29/2014    Class: Chronic   Past Medical History:  Diagnosis Date  . Arthritis   . GERD (gastroesophageal reflux disease)   . History of BPH   . Hyperlipidemia   . Hypertension   . RLS  (restless legs syndrome)   . Ventral hernia   . Vertigo     Family History  Problem Relation Age of Onset  . Osteoarthritis Mother     Past Surgical History:  Procedure Laterality Date  . BACK SURGERY    . HAMMER TOE SURGERY Left 2012  . LUMBAR LAMINECTOMY/DECOMPRESSION MICRODISCECTOMY N/A 05/29/2014   Procedure: CENTRAL LUMBAR LAMINECTOMY L3-4 and L4-5;  Surgeon: Jessy Oto, MD;  Location: [facility];  Service: Orthopedics;  Laterality: N/A;   Social History   Occupational History  .      retired from [employer], Clinical biochemist   Social History Main Topics  . Smoking status: Never Smoker  . Smokeless tobacco: Never Used  . Alcohol use No     Comment: 01/23/16 couple beers a week  . Drug use: No  . Sexual activity: Not on file

## 2016-12-01 ENCOUNTER — Other Ambulatory Visit: Payer: Self-pay | Admitting: Neurosurgery

## 2016-12-01 DIAGNOSIS — M4310 Spondylolisthesis, site unspecified: Secondary | ICD-10-CM

## 2016-12-03 DIAGNOSIS — H402211 Chronic angle-closure glaucoma, right eye, mild stage: Secondary | ICD-10-CM | POA: Diagnosis not present

## 2016-12-03 DIAGNOSIS — H40051 Ocular hypertension, right eye: Secondary | ICD-10-CM | POA: Diagnosis not present

## 2016-12-05 ENCOUNTER — Ambulatory Visit
Admission: RE | Admit: 2016-12-05 | Discharge: 2016-12-05 | Disposition: A | Discharge status: Home / Self Care | Payer: Medicare Other | Source: Ambulatory Visit | Attending: Neurosurgery | Admitting: Neurosurgery

## 2016-12-05 DIAGNOSIS — M4310 Spondylolisthesis, site unspecified: Secondary | ICD-10-CM

## 2016-12-05 DIAGNOSIS — M47817 Spondylosis without myelopathy or radiculopathy, lumbosacral region: Secondary | ICD-10-CM | POA: Diagnosis not present

## 2016-12-05 MED ORDER — METHYLPREDNISOLONE ACETATE 40 MG/ML INJ SUSP (RADIOLOG
120.0000 mg | Freq: Once | INTRAMUSCULAR | Status: AC
  Administered 2016-12-05: 120 mg via EPIDURAL

## 2016-12-05 MED ORDER — IOPAMIDOL (ISOVUE-M 200) INJECTION 41%
1.0000 mL | Freq: Once | INTRAMUSCULAR | Status: AC
  Administered 2016-12-05: 1 mL via EPIDURAL

## 2016-12-05 NOTE — Discharge Instructions (Signed)

## 2016-12-29 ENCOUNTER — Telehealth (INDEPENDENT_AMBULATORY_CARE_PROVIDER_SITE_OTHER): Payer: Self-pay | Admitting: *Deleted

## 2016-12-29 DIAGNOSIS — M25562 Pain in left knee: Secondary | ICD-10-CM | POA: Diagnosis not present

## 2016-12-29 DIAGNOSIS — M25511 Pain in right shoulder: Secondary | ICD-10-CM | POA: Diagnosis not present

## 2016-12-29 DIAGNOSIS — M25462 Effusion, left knee: Secondary | ICD-10-CM | POA: Diagnosis not present

## 2016-12-29 NOTE — Telephone Encounter (Signed)
Patient came in this afternoon in regards to wanting to know when he could possibly have his Injection done on his left knee? His best call back number is actually his wife's phone number [phone]. Thank you

## 2017-01-01 DIAGNOSIS — M4310 Spondylolisthesis, site unspecified: Secondary | ICD-10-CM | POA: Diagnosis not present

## 2017-01-14 ENCOUNTER — Ambulatory Visit (INDEPENDENT_AMBULATORY_CARE_PROVIDER_SITE_OTHER): Payer: Medicare Other | Admitting: Orthopedic Surgery

## 2017-01-14 ENCOUNTER — Encounter (INDEPENDENT_AMBULATORY_CARE_PROVIDER_SITE_OTHER): Payer: Self-pay | Admitting: Orthopedic Surgery

## 2017-01-14 DIAGNOSIS — M1712 Unilateral primary osteoarthritis, left knee: Secondary | ICD-10-CM

## 2017-01-14 MED ORDER — HYLAN G-F 20 48 MG/6ML IX SOSY
48.0000 mg | PREFILLED_SYRINGE | INTRA_ARTICULAR | Status: AC | PRN
  Administered 2017-01-14: 48 mg via INTRA_ARTICULAR

## 2017-01-14 MED ORDER — LIDOCAINE HCL 1 % IJ SOLN
5.0000 mL | INTRAMUSCULAR | Status: AC | PRN
  Administered 2017-01-14: 5 mL

## 2017-01-14 NOTE — Progress Notes (Signed)
   Procedure Note  Patient: Cody Floyd             Date of Birth: 1940-09-28           MRN: [id number]             Visit Date: 01/14/2017  Procedures: Visit Diagnoses: Unilateral primary osteoarthritis, left knee  Large Joint Inj Date/Time: 01/14/2017 11:42 PM Performed by: Meredith Pel Authorized by: Meredith Pel   Consent Given by:  Patient Site marked: the procedure site was marked   Timeout: prior to procedure the correct patient, procedure, and site was verified   Indications:  Pain, joint swelling and diagnostic evaluation Location:  Knee Site:  L knee Prep: patient was prepped and draped in usual sterile fashion   Needle Size:  18 G Needle Length:  1.5 inches Approach:  Superolateral Ultrasound Guidance: No   Fluoroscopic Guidance: No   Arthrogram: No   Medications:  5 mL lidocaine 1 %; 48 mg Hylan 48 MG/6ML Patient tolerance:  Patient tolerated the procedure well with no immediate complications   [name] is a patient with left knee arthritis.  He underwent left knee gel injection today.  He would like to consider total knee replacement.  He has night pain and rest pain and pain which is keeping him from walking.  His wife is had good result from bilateral knee replacements.  He reports decreased activity endurance due to his knee pain.  He has had a long trial of conservative treatment.  Risks and benefits of knee replacement are discussed with the patient including not limited to infection or vessel damage incomplete pain relief along with stiffness.  Patient understands the risks and benefits and wishes to proceed.  Based on bone quality ejected baby a reasonable candidate for press fit knee replacement.  All questions answered.

## 2017-01-15 DIAGNOSIS — H40053 Ocular hypertension, bilateral: Secondary | ICD-10-CM | POA: Diagnosis not present

## 2017-01-15 DIAGNOSIS — H402211 Chronic angle-closure glaucoma, right eye, mild stage: Secondary | ICD-10-CM | POA: Diagnosis not present

## 2017-01-15 DIAGNOSIS — H402222 Chronic angle-closure glaucoma, left eye, moderate stage: Secondary | ICD-10-CM | POA: Diagnosis not present

## 2017-01-16 ENCOUNTER — Telehealth (INDEPENDENT_AMBULATORY_CARE_PROVIDER_SITE_OTHER): Payer: Self-pay | Admitting: Orthopedic Surgery

## 2017-01-16 NOTE — Telephone Encounter (Signed)
LVM with pt to call to schedule surgery. Will try pt again at a later time. 

## 2017-01-20 ENCOUNTER — Other Ambulatory Visit (INDEPENDENT_AMBULATORY_CARE_PROVIDER_SITE_OTHER): Payer: Self-pay | Admitting: Orthopedic Surgery

## 2017-01-20 DIAGNOSIS — M1712 Unilateral primary osteoarthritis, left knee: Secondary | ICD-10-CM

## 2017-03-19 NOTE — Pre-Procedure Instructions (Signed)
Cody Floyd  03/19/2017      [facility], [place] [address] Phone: [phone] Fax: [phone]    Your procedure is scheduled on August 7  Report to [facility] at 1200 P.M.  Call this number if you have problems the morning of surgery:  [phone]   Remember:  Do not eat food or drink liquids after midnight.  Continue all other medications as directed by your physician except follow these instructions about you medications   Take these medicines the morning of surgery with A SIP OF WATER eye drops if needed, esomeprazole (NEXIUM)if needed, propranolol (INNOPRAN XL),  7 days prior to surgery STOP taking any Aspirin, Aleve, Naproxen, Ibuprofen, Motrin, Advil, Goody's, BC's, all herbal medications, fish oil, and all vitamins    Do not wear jewelry  Do not wear lotions, powders, or cologne, or deoderant.  Men may shave face and neck.  Do not bring valuables to the hospital.  [facility] is not responsible for any belongings or valuables.  Contacts, dentures or bridgework may not be worn into surgery.  Leave your suitcase in the car.  After surgery it may be brought to your room.  For patients admitted to the hospital, discharge time will be determined by your treatment team.  Patients discharged the day of surgery will not be allowed to drive home.    Special instructions:   [place]- Preparing For Surgery  Before surgery, you can play an important role. Because skin is not sterile, your skin needs to be as free of germs as possible. You can reduce the number of germs on your skin by washing with CHG (chlorahexidine gluconate) Soap before surgery.  CHG is an antiseptic cleaner which kills germs and bonds with the skin to continue killing germs even after washing.  Please do not use if you have an allergy to CHG or antibacterial soaps. If your skin becomes  reddened/irritated stop using the CHG.  Do not shave (including legs and underarms) for at least 48 hours prior to first CHG shower. It is OK to shave your face.  Please follow these instructions carefully.   1. Shower the NIGHT BEFORE SURGERY and the MORNING OF SURGERY with CHG.   2. If you chose to wash your hair, wash your hair first as usual with your normal shampoo.  3. After you shampoo, rinse your hair and body thoroughly to remove the shampoo.  4. Use CHG as you would any other liquid soap. You can apply CHG directly to the skin and wash gently with a scrungie or a clean washcloth.   5. Apply the CHG Soap to your body ONLY FROM THE NECK DOWN.  Do not use on open wounds or open sores. Avoid contact with your eyes, ears, mouth and genitals (private parts). Wash genitals (private parts) with your normal soap.  6. Wash thoroughly, paying special attention to the area where your surgery will be performed.  7. Thoroughly rinse your body with warm water from the neck down.  8. DO NOT shower/wash with your normal soap after using and rinsing off the CHG Soap.  9. Pat yourself dry with a CLEAN TOWEL.   10. Wear CLEAN PAJAMAS   11. Place CLEAN SHEETS on your bed the night of your first shower and DO NOT SLEEP WITH PETS.    Day of Surgery: Do not apply any deodorants/lotions.  Please wear clean clothes to the hospital/surgery center.      Please read over the following fact sheets that you were given.

## 2017-03-20 ENCOUNTER — Ambulatory Visit (HOSPITAL_COMMUNITY)
Admission: RE | Admit: 2017-03-20 | Discharge: 2017-03-20 | Disposition: A | Discharge status: Home / Self Care | Payer: Medicare Other | Source: Ambulatory Visit | Attending: Orthopedic Surgery | Admitting: Orthopedic Surgery

## 2017-03-20 ENCOUNTER — Encounter (HOSPITAL_COMMUNITY): Payer: Self-pay

## 2017-03-20 ENCOUNTER — Encounter (HOSPITAL_COMMUNITY)
Admission: RE | Admit: 2017-03-20 | Discharge: 2017-03-20 | Disposition: A | Discharge status: Home / Self Care | Payer: Medicare Other | Source: Ambulatory Visit | Attending: Orthopedic Surgery | Admitting: Orthopedic Surgery

## 2017-03-20 DIAGNOSIS — L989 Disorder of the skin and subcutaneous tissue, unspecified: Secondary | ICD-10-CM | POA: Diagnosis not present

## 2017-03-20 DIAGNOSIS — Z01810 Encounter for preprocedural cardiovascular examination: Secondary | ICD-10-CM | POA: Insufficient documentation

## 2017-03-20 DIAGNOSIS — Z01812 Encounter for preprocedural laboratory examination: Secondary | ICD-10-CM | POA: Insufficient documentation

## 2017-03-20 DIAGNOSIS — I4510 Unspecified right bundle-branch block: Secondary | ICD-10-CM | POA: Insufficient documentation

## 2017-03-20 DIAGNOSIS — Z96652 Presence of left artificial knee joint: Secondary | ICD-10-CM | POA: Diagnosis not present

## 2017-03-20 DIAGNOSIS — Z471 Aftercare following joint replacement surgery: Secondary | ICD-10-CM | POA: Diagnosis not present

## 2017-03-20 DIAGNOSIS — L309 Dermatitis, unspecified: Secondary | ICD-10-CM | POA: Diagnosis not present

## 2017-03-20 DIAGNOSIS — Z419 Encounter for procedure for purposes other than remedying health state, unspecified: Secondary | ICD-10-CM

## 2017-03-20 DIAGNOSIS — I10 Essential (primary) hypertension: Secondary | ICD-10-CM | POA: Diagnosis not present

## 2017-03-20 LAB — CBC
HCT: 43.8 % (ref 39.0–52.0)
HEMOGLOBIN: 14.9 g/dL (ref 13.0–17.0)
MCH: 28.8 pg (ref 26.0–34.0)
MCHC: 34 g/dL (ref 30.0–36.0)
MCV: 84.6 fL (ref 78.0–100.0)
Platelets: 148 10*3/uL — ABNORMAL LOW (ref 150–400)
RBC: 5.18 MIL/uL (ref 4.22–5.81)
RDW: 15.1 % (ref 11.5–15.5)
WBC: 7 10*3/uL (ref 4.0–10.5)

## 2017-03-20 LAB — URINALYSIS, ROUTINE W REFLEX MICROSCOPIC
Bilirubin Urine: NEGATIVE
GLUCOSE, UA: NEGATIVE mg/dL
Hgb urine dipstick: NEGATIVE
KETONES UR: NEGATIVE mg/dL
Leukocytes, UA: NEGATIVE
Nitrite: NEGATIVE
PH: 5 (ref 5.0–8.0)
PROTEIN: NEGATIVE mg/dL
Specific Gravity, Urine: 1.025 (ref 1.005–1.030)

## 2017-03-20 LAB — BASIC METABOLIC PANEL
ANION GAP: 7 (ref 5–15)
BUN: 26 mg/dL — AB (ref 6–20)
CALCIUM: 9.7 mg/dL (ref 8.9–10.3)
CO2: 23 mmol/L (ref 22–32)
CREATININE: 1.39 mg/dL — AB (ref 0.61–1.24)
Chloride: 109 mmol/L (ref 101–111)
GFR calc Af Amer: 55 mL/min — ABNORMAL LOW (ref >60)
GFR calc non Af Amer: 48 mL/min — ABNORMAL LOW (ref >60)
GLUCOSE: 103 mg/dL — AB (ref 65–99)
Potassium: 4.4 mmol/L (ref 3.5–5.1)
Sodium: 139 mmol/L (ref 135–145)

## 2017-03-20 LAB — SURGICAL PCR SCREEN
MRSA, PCR: NEGATIVE
Staphylococcus aureus: NEGATIVE

## 2017-03-20 NOTE — Progress Notes (Signed)
Pt c/o rash to right upper abdomen and left lower abdomen that looks like poison ivy, pt states he was cleaning his gutters, then started itching and noticed this rash. No open areas or draining from area. Pt states he will call his Primary doctor to be seen about this rash. Pt to call Dr. Randel Pigg office about rash after seeing PCP.

## 2017-03-20 NOTE — Progress Notes (Signed)
PCP - Arlys John Cardiologist - pt denies cardiologist or cardiac history  Chest x-ray - 03/20/2017  EKG - 03/20/2017  Stress Test - pt reports normal stress test "years ago" but does not remember who did this test  BP elevated at PAT- systolic in 429I. Pt states he has not taken his blood pressure medicines today. Pt encouraged to keep taking medications as prescribed.   Patient denies shortness of breath, fever, cough and chest pain at PAT appointment  Patient verbalized understanding of instructions that were given to them at the PAT appointment. Patient was also instructed that they will need to review over the PAT instructions again at home before surgery.

## 2017-03-20 NOTE — Pre-Procedure Instructions (Signed)
Cody Floyd  03/20/2017      [facility], [place] [address] Phone: [phone] Fax: [phone]  [facility] [place], [place] - [address] [place] [address] Phone: [phone] Fax: [phone]    Your procedure is scheduled on August 7  Report to [facility] at 1200 P.M.  Call this number if you have problems the morning of surgery:  [phone]   Remember:  Do not eat food or drink liquids after midnight.  Continue all other medications as directed by your physician except follow these instructions about you medications   Take these medicines the morning of surgery with A SIP OF WATER eye drops if needed,  propranolol (INNOPRAN XL), prilosec (esomeprazole)  7 days prior to surgery STOP taking any Aspirin, Aleve, Naproxen, Ibuprofen, Motrin, Advil, Goody's, BC's, all herbal medications, fish oil, and all vitamins    Do not wear jewelry  Do not wear lotions, powders, or cologne, or deoderant.  Men may shave face and neck.  Do not bring valuables to the hospital.  [facility] is not responsible for any belongings or valuables.  Contacts, dentures or bridgework may not be worn into surgery.  Leave your suitcase in the car.  After surgery it may be brought to your room.  For patients admitted to the hospital, discharge time will be determined by your treatment team.  Patients discharged the day of surgery will not be allowed to drive home.    Special instructions:   [place]- Preparing For Surgery  Before surgery, you can play an important role. Because skin is not sterile, your skin needs to be as free of germs as possible. You can reduce the number of germs on your skin by washing with CHG (chlorahexidine gluconate) Soap before surgery.  CHG is an antiseptic cleaner which kills  germs and bonds with the skin to continue killing germs even after washing.  Please do not use if you have an allergy to CHG or antibacterial soaps. If your skin becomes reddened/irritated stop using the CHG.  Do not shave (including legs and underarms) for at least 48 hours prior to first CHG shower. It is OK to shave your face.  Please follow these instructions carefully.   1. Shower the NIGHT BEFORE SURGERY and the MORNING OF SURGERY with CHG.   2. If you chose to wash your hair, wash your hair first as usual with your normal shampoo.  3. After you shampoo, rinse your hair and body thoroughly to remove the shampoo.  4. Use CHG as you would any other liquid soap. You can apply CHG directly to the skin and wash gently with a scrungie or a clean washcloth.   5. Apply the CHG Soap to your body ONLY FROM THE NECK DOWN.  Do not use on open wounds or open sores. Avoid contact with your eyes, ears, mouth and genitals (private parts). Wash genitals (private parts) with your normal soap.  6. Wash thoroughly, paying special attention to the area where your surgery will be performed.  7. Thoroughly rinse your body with warm water from the neck down.  8. DO NOT shower/wash with your normal soap after using and rinsing off the CHG Soap.  9. Pat yourself dry with a CLEAN TOWEL.   10. Wear CLEAN PAJAMAS  11. Place CLEAN SHEETS on your bed the night of your first shower and DO NOT SLEEP WITH PETS.    Day of Surgery: Do not apply any deodorants/lotions. Please wear clean clothes to the hospital/surgery center.      Please read over the following fact sheets that you were given.

## 2017-03-21 LAB — URINE CULTURE: CULTURE: NO GROWTH

## 2017-03-30 NOTE — Anesthesia Preprocedure Evaluation (Signed)
Anesthesia Evaluation  Patient identified by MRN, date of birth, ID band Patient awake    Reviewed: Allergy & Precautions, H&P , NPO status , Patient's Chart, lab work & pertinent test results, reviewed documented beta blocker date and time   History of Anesthesia Complications Negative for: history of anesthetic complications  Airway Mallampati: I  TM Distance: >3 FB Neck ROM: Full    Dental  (+) Lower Dentures, Upper Dentures, Dental Advisory Given   Pulmonary neg pulmonary ROS,    breath sounds clear to auscultation       Cardiovascular hypertension, Pt. on home beta blockers and Pt. on medications  Rhythm:Regular Rate:Normal  Propranolol 5 a.m.   Neuro/Psych    GI/Hepatic GERD  Medicated and Controlled,  Endo/Other  Morbid obesity  Renal/GU      Musculoskeletal  (+) Arthritis ,   Abdominal (+) + obese,   Peds  Hematology   Anesthesia Other Findings EKG  7/18  Sinus rhythm with 1st degree A-V block Left axis deviation Right bundle branch block  Reproductive/Obstetrics                             Anesthesia Physical  Anesthesia Plan  ASA: III  Anesthesia Plan: Spinal   Post-op Pain Management:  Regional for Post-op pain   Induction: Intravenous  PONV Risk Score and Plan: 2 and Ondansetron, Dexamethasone, Treatment may vary due to age or medical condition and Midazolam  Airway Management Planned: Nasal Cannula, Natural Airway and Simple Face Mask  Additional Equipment:   Intra-op Plan:   Post-operative Plan: Extubation in OR  Informed Consent: I have reviewed the patients History and Physical, chart, labs and discussed the procedure including the risks, benefits and alternatives for the proposed anesthesia with the patient or authorized representative who has indicated his/her understanding and acceptance.   Dental advisory given  Plan Discussed with: CRNA and  Surgeon  Anesthesia Plan Comments: (  )        Anesthesia Quick Evaluation

## 2017-03-31 ENCOUNTER — Inpatient Hospital Stay (HOSPITAL_COMMUNITY): Payer: Medicare Other | Admitting: Anesthesiology

## 2017-03-31 ENCOUNTER — Encounter (HOSPITAL_COMMUNITY)
Admission: RE | Disposition: A | Discharge status: Home Health | Payer: Self-pay | Source: Ambulatory Visit | Attending: Orthopedic Surgery

## 2017-03-31 ENCOUNTER — Inpatient Hospital Stay (HOSPITAL_COMMUNITY)
Admission: RE | Admit: 2017-03-31 | Discharge: 2017-04-02 | DRG: 470 | Disposition: A | Discharge status: Home Health | Payer: Medicare Other | Source: Ambulatory Visit | Attending: Orthopedic Surgery | Admitting: Orthopedic Surgery

## 2017-03-31 ENCOUNTER — Encounter (HOSPITAL_COMMUNITY): Payer: Self-pay | Admitting: *Deleted

## 2017-03-31 DIAGNOSIS — E785 Hyperlipidemia, unspecified: Secondary | ICD-10-CM | POA: Diagnosis not present

## 2017-03-31 DIAGNOSIS — I10 Essential (primary) hypertension: Secondary | ICD-10-CM | POA: Diagnosis present

## 2017-03-31 DIAGNOSIS — I4510 Unspecified right bundle-branch block: Secondary | ICD-10-CM | POA: Diagnosis not present

## 2017-03-31 DIAGNOSIS — M1712 Unilateral primary osteoarthritis, left knee: Secondary | ICD-10-CM | POA: Diagnosis not present

## 2017-03-31 DIAGNOSIS — Z79899 Other long term (current) drug therapy: Secondary | ICD-10-CM

## 2017-03-31 DIAGNOSIS — N400 Enlarged prostate without lower urinary tract symptoms: Secondary | ICD-10-CM | POA: Diagnosis present

## 2017-03-31 DIAGNOSIS — G2581 Restless legs syndrome: Secondary | ICD-10-CM | POA: Diagnosis present

## 2017-03-31 DIAGNOSIS — Z6829 Body mass index (BMI) 29.0-29.9, adult: Secondary | ICD-10-CM

## 2017-03-31 DIAGNOSIS — Z96652 Presence of left artificial knee joint: Secondary | ICD-10-CM | POA: Diagnosis not present

## 2017-03-31 DIAGNOSIS — M5416 Radiculopathy, lumbar region: Secondary | ICD-10-CM | POA: Diagnosis not present

## 2017-03-31 DIAGNOSIS — Z7982 Long term (current) use of aspirin: Secondary | ICD-10-CM

## 2017-03-31 DIAGNOSIS — D62 Acute posthemorrhagic anemia: Secondary | ICD-10-CM | POA: Diagnosis not present

## 2017-03-31 DIAGNOSIS — K219 Gastro-esophageal reflux disease without esophagitis: Secondary | ICD-10-CM | POA: Diagnosis present

## 2017-03-31 DIAGNOSIS — M48062 Spinal stenosis, lumbar region with neurogenic claudication: Secondary | ICD-10-CM | POA: Diagnosis present

## 2017-03-31 DIAGNOSIS — Z8261 Family history of arthritis: Secondary | ICD-10-CM | POA: Diagnosis not present

## 2017-03-31 DIAGNOSIS — E669 Obesity, unspecified: Secondary | ICD-10-CM | POA: Diagnosis present

## 2017-03-31 DIAGNOSIS — M1710 Unilateral primary osteoarthritis, unspecified knee: Secondary | ICD-10-CM | POA: Diagnosis present

## 2017-03-31 DIAGNOSIS — G8918 Other acute postprocedural pain: Secondary | ICD-10-CM | POA: Diagnosis not present

## 2017-03-31 HISTORY — PX: TOTAL KNEE ARTHROPLASTY: SHX125

## 2017-03-31 SURGERY — ARTHROPLASTY, KNEE, TOTAL
Anesthesia: Spinal | Site: Knee | Laterality: Left

## 2017-03-31 MED ORDER — CLONIDINE HCL (ANALGESIA) 100 MCG/ML EP SOLN
150.0000 ug | Freq: Once | EPIDURAL | Status: DC
  Filled 2017-03-31: qty 1.5

## 2017-03-31 MED ORDER — OXYCODONE HCL 5 MG PO TABS
5.0000 mg | ORAL_TABLET | ORAL | Status: DC | PRN
  Administered 2017-03-31 (×2): 10 mg via ORAL
  Filled 2017-03-31 (×2): qty 2

## 2017-03-31 MED ORDER — METHOCARBAMOL 1000 MG/10ML IJ SOLN
500.0000 mg | Freq: Four times a day (QID) | INTRAMUSCULAR | Status: DC | PRN
  Filled 2017-03-31: qty 5

## 2017-03-31 MED ORDER — ONDANSETRON HCL 4 MG/2ML IJ SOLN
INTRAMUSCULAR | Status: DC | PRN
  Administered 2017-03-31: 4 mg via INTRAVENOUS

## 2017-03-31 MED ORDER — SODIUM CHLORIDE 0.9 % IR SOLN
Status: DC | PRN
  Administered 2017-03-31: 3000 mL

## 2017-03-31 MED ORDER — MIDAZOLAM HCL 5 MG/5ML IJ SOLN
INTRAMUSCULAR | Status: DC | PRN
  Administered 2017-03-31 (×2): 1 mg via INTRAVENOUS

## 2017-03-31 MED ORDER — FENTANYL CITRATE (PF) 250 MCG/5ML IJ SOLN
INTRAMUSCULAR | Status: DC | PRN
  Administered 2017-03-31 (×2): 50 ug via INTRAVENOUS

## 2017-03-31 MED ORDER — ONDANSETRON HCL 4 MG PO TABS: 4.0000 mg | ORAL_TABLET | Freq: Four times a day (QID) | ORAL | Status: DC | PRN

## 2017-03-31 MED ORDER — PROPOFOL 500 MG/50ML IV EMUL
INTRAVENOUS | Status: DC | PRN
  Administered 2017-03-31: 50 ug/kg/min via INTRAVENOUS

## 2017-03-31 MED ORDER — MORPHINE SULFATE (PF) 4 MG/ML IV SOLN: 4.0000 mg | INTRAVENOUS | Status: DC | PRN

## 2017-03-31 MED ORDER — BUPIVACAINE IN DEXTROSE 0.75-8.25 % IT SOLN
INTRATHECAL | Status: DC | PRN
  Administered 2017-03-31: 12 mL via INTRATHECAL

## 2017-03-31 MED ORDER — PROPOFOL 10 MG/ML IV BOLUS
INTRAVENOUS | Status: DC | PRN
  Administered 2017-03-31 (×2): 20 mg via INTRAVENOUS

## 2017-03-31 MED ORDER — LATANOPROST 0.005 % OP SOLN
1.0000 [drp] | Freq: Every day | OPHTHALMIC | Status: DC
  Administered 2017-04-01: 1 [drp] via OPHTHALMIC
  Filled 2017-03-31: qty 2.5

## 2017-03-31 MED ORDER — BUPIVACAINE LIPOSOME 1.3 % IJ SUSP
20.0000 mL | INTRAMUSCULAR | Status: AC
Start: 2017-03-31 — End: 2017-03-31
  Administered 2017-03-31: 20 mL
  Filled 2017-03-31: qty 20

## 2017-03-31 MED ORDER — PHENYLEPHRINE HCL 10 MG/ML IJ SOLN
INTRAVENOUS | Status: DC | PRN
  Administered 2017-03-31: 10 ug/min via INTRAVENOUS

## 2017-03-31 MED ORDER — METHOCARBAMOL 500 MG PO TABS
500.0000 mg | ORAL_TABLET | Freq: Four times a day (QID) | ORAL | Status: DC | PRN
  Administered 2017-03-31 – 2017-04-02 (×4): 500 mg via ORAL
  Filled 2017-03-31 (×4): qty 1

## 2017-03-31 MED ORDER — LACTATED RINGERS IV SOLN
INTRAVENOUS | Status: DC | PRN
  Administered 2017-03-31 (×2): via INTRAVENOUS

## 2017-03-31 MED ORDER — PANTOPRAZOLE SODIUM 40 MG PO TBEC
40.0000 mg | DELAYED_RELEASE_TABLET | Freq: Every day | ORAL | Status: DC
  Administered 2017-04-01 – 2017-04-02 (×2): 40 mg via ORAL
  Filled 2017-03-31 (×2): qty 1

## 2017-03-31 MED ORDER — MIDAZOLAM HCL 2 MG/2ML IJ SOLN
INTRAMUSCULAR | Status: AC
  Filled 2017-03-31: qty 2

## 2017-03-31 MED ORDER — PROPOFOL 1000 MG/100ML IV EMUL
INTRAVENOUS | Status: AC
  Filled 2017-03-31: qty 100

## 2017-03-31 MED ORDER — FENTANYL CITRATE (PF) 250 MCG/5ML IJ SOLN
INTRAMUSCULAR | Status: AC
  Filled 2017-03-31: qty 5

## 2017-03-31 MED ORDER — CHLORHEXIDINE GLUCONATE 4 % EX LIQD: 60.0000 mL | Freq: Once | CUTANEOUS | Status: DC

## 2017-03-31 MED ORDER — ONDANSETRON HCL 4 MG/2ML IJ SOLN: 4.0000 mg | Freq: Four times a day (QID) | INTRAMUSCULAR | Status: DC | PRN

## 2017-03-31 MED ORDER — MENTHOL 3 MG MT LOZG: 1.0000 | LOZENGE | OROMUCOSAL | Status: DC | PRN

## 2017-03-31 MED ORDER — ONDANSETRON HCL 4 MG/2ML IJ SOLN: 4.0000 mg | Freq: Once | INTRAMUSCULAR | Status: DC | PRN

## 2017-03-31 MED ORDER — BUPIVACAINE-EPINEPHRINE (PF) 0.5% -1:200000 IJ SOLN
INTRAMUSCULAR | Status: AC
  Filled 2017-03-31: qty 30

## 2017-03-31 MED ORDER — SODIUM CHLORIDE 0.9% FLUSH
INTRAVENOUS | Status: DC | PRN
  Administered 2017-03-31: 20 mL

## 2017-03-31 MED ORDER — DOCUSATE SODIUM 100 MG PO CAPS
100.0000 mg | ORAL_CAPSULE | Freq: Two times a day (BID) | ORAL | Status: DC
  Administered 2017-03-31 – 2017-04-02 (×4): 100 mg via ORAL
  Filled 2017-03-31 (×4): qty 1

## 2017-03-31 MED ORDER — BUPIVACAINE-EPINEPHRINE (PF) 0.25% -1:200000 IJ SOLN
INTRAMUSCULAR | Status: DC | PRN
  Administered 2017-03-31: 20 mL

## 2017-03-31 MED ORDER — TRANEXAMIC ACID 1000 MG/10ML IV SOLN
2000.0000 mg | INTRAVENOUS | Status: AC
  Administered 2017-03-31: 2000 mg via TOPICAL
  Filled 2017-03-31: qty 20

## 2017-03-31 MED ORDER — GABAPENTIN 300 MG PO CAPS
300.0000 mg | ORAL_CAPSULE | Freq: Three times a day (TID) | ORAL | Status: DC
  Administered 2017-03-31 – 2017-04-02 (×7): 300 mg via ORAL
  Filled 2017-03-31 (×7): qty 1

## 2017-03-31 MED ORDER — ONDANSETRON HCL 4 MG/2ML IJ SOLN
INTRAMUSCULAR | Status: AC
  Filled 2017-03-31: qty 2

## 2017-03-31 MED ORDER — ACETAMINOPHEN 650 MG RE SUPP: 650.0000 mg | Freq: Four times a day (QID) | RECTAL | Status: DC | PRN

## 2017-03-31 MED ORDER — LISINOPRIL 10 MG PO TABS
10.0000 mg | ORAL_TABLET | Freq: Every day | ORAL | Status: DC
Start: 2017-03-31 — End: 2017-04-02
  Administered 2017-03-31 – 2017-04-02 (×3): 10 mg via ORAL
  Filled 2017-03-31 (×3): qty 1

## 2017-03-31 MED ORDER — MORPHINE SULFATE (PF) 4 MG/ML IV SOLN
INTRAVENOUS | Status: AC
  Filled 2017-03-31: qty 2

## 2017-03-31 MED ORDER — MEPERIDINE HCL 25 MG/ML IJ SOLN: 6.2500 mg | INTRAMUSCULAR | Status: DC | PRN

## 2017-03-31 MED ORDER — TRANEXAMIC ACID 1000 MG/10ML IV SOLN
1000.0000 mg | INTRAVENOUS | Status: AC
  Administered 2017-03-31: 1000 mg via INTRAVENOUS
  Filled 2017-03-31: qty 10

## 2017-03-31 MED ORDER — FENTANYL CITRATE (PF) 100 MCG/2ML IJ SOLN: 25.0000 ug | INTRAMUSCULAR | Status: DC | PRN

## 2017-03-31 MED ORDER — BUPIVACAINE HCL (PF) 0.25 % IJ SOLN
INTRAMUSCULAR | Status: AC
  Filled 2017-03-31: qty 30

## 2017-03-31 MED ORDER — OMEPRAZOLE MAGNESIUM 20 MG PO TBEC: 20.0000 mg | DELAYED_RELEASE_TABLET | Freq: Every day | ORAL | Status: DC

## 2017-03-31 MED ORDER — CEFAZOLIN SODIUM-DEXTROSE 2-4 GM/100ML-% IV SOLN
2.0000 g | INTRAVENOUS | Status: AC
  Administered 2017-03-31: 2 g via INTRAVENOUS

## 2017-03-31 MED ORDER — RIVAROXABAN 10 MG PO TABS
10.0000 mg | ORAL_TABLET | Freq: Every day | ORAL | Status: DC
  Administered 2017-04-01 – 2017-04-02 (×2): 10 mg via ORAL
  Filled 2017-03-31 (×2): qty 1

## 2017-03-31 MED ORDER — BUPIVACAINE-EPINEPHRINE (PF) 0.25% -1:200000 IJ SOLN
INTRAMUSCULAR | Status: AC
  Filled 2017-03-31: qty 30

## 2017-03-31 MED ORDER — ROPIVACAINE HCL 5 MG/ML IJ SOLN
INTRAMUSCULAR | Status: DC | PRN
  Administered 2017-03-31: 30 mL via PERINEURAL

## 2017-03-31 MED ORDER — PROPOFOL 10 MG/ML IV BOLUS
INTRAVENOUS | Status: AC
  Filled 2017-03-31: qty 20

## 2017-03-31 MED ORDER — CEFAZOLIN SODIUM-DEXTROSE 2-4 GM/100ML-% IV SOLN
INTRAVENOUS | Status: AC
  Filled 2017-03-31: qty 100

## 2017-03-31 MED ORDER — METOCLOPRAMIDE HCL 5 MG/ML IJ SOLN: 5.0000 mg | Freq: Three times a day (TID) | INTRAMUSCULAR | Status: DC | PRN

## 2017-03-31 MED ORDER — PHENOL 1.4 % MT LIQD: 1.0000 | OROMUCOSAL | Status: DC | PRN

## 2017-03-31 MED ORDER — ACETAMINOPHEN 325 MG PO TABS: 650.0000 mg | ORAL_TABLET | Freq: Four times a day (QID) | ORAL | Status: DC | PRN

## 2017-03-31 MED ORDER — CEFAZOLIN SODIUM-DEXTROSE 2-4 GM/100ML-% IV SOLN
2.0000 g | Freq: Four times a day (QID) | INTRAVENOUS | Status: AC
  Filled 2017-03-31: qty 100

## 2017-03-31 MED ORDER — MECLIZINE HCL 25 MG PO TABS: 25.0000 mg | ORAL_TABLET | Freq: Three times a day (TID) | ORAL | Status: DC | PRN

## 2017-03-31 MED ORDER — 0.9 % SODIUM CHLORIDE (POUR BTL) OPTIME
TOPICAL | Status: DC | PRN
  Administered 2017-03-31: 1000 mL

## 2017-03-31 MED ORDER — METOCLOPRAMIDE HCL 5 MG PO TABS: 5.0000 mg | ORAL_TABLET | Freq: Three times a day (TID) | ORAL | Status: DC | PRN

## 2017-03-31 MED ORDER — PROPRANOLOL HCL ER BEADS 120 MG PO CP24
120.0000 mg | ORAL_CAPSULE | Freq: Every day | ORAL | Status: DC | PRN
  Filled 2017-03-31 (×2): qty 1

## 2017-03-31 SURGICAL SUPPLY — 78 items
BANDAGE ACE 4X5 VEL STRL LF (GAUZE/BANDAGES/DRESSINGS) ×2 IMPLANT
BANDAGE ELASTIC 6 VELCRO ST LF (GAUZE/BANDAGES/DRESSINGS) ×2 IMPLANT
BANDAGE ESMARK 6X9 LF (GAUZE/BANDAGES/DRESSINGS) ×1 IMPLANT
BLADE SAG 18X100X1.27 (BLADE) ×2 IMPLANT
BLADE SAW SGTL 13.0X1.19X90.0M (BLADE) IMPLANT
BNDG COHESIVE 6X5 TAN STRL LF (GAUZE/BANDAGES/DRESSINGS) ×2 IMPLANT
BNDG ELASTIC 6X15 VLCR STRL LF (GAUZE/BANDAGES/DRESSINGS) ×6 IMPLANT
BNDG ESMARK 6X9 LF (GAUZE/BANDAGES/DRESSINGS) ×2
BOWL SMART MIX CTS (DISPOSABLE) IMPLANT
CAPT KNEE TRIATH TK-4 ×2 IMPLANT
CLSR STERI-STRIP ANTIMIC 1/2X4 (GAUZE/BANDAGES/DRESSINGS) ×2 IMPLANT
COVER SURGICAL LIGHT HANDLE (MISCELLANEOUS) ×2 IMPLANT
CUFF TOURNIQUET SINGLE 34IN LL (TOURNIQUET CUFF) ×2 IMPLANT
CUFF TOURNIQUET SINGLE 44IN (TOURNIQUET CUFF) IMPLANT
DECANTER SPIKE VIAL GLASS SM (MISCELLANEOUS) ×2 IMPLANT
DRAPE INCISE IOBAN 66X45 STRL (DRAPES) IMPLANT
DRAPE ORTHO SPLIT 77X108 STRL (DRAPES) ×3
DRAPE SURG ORHT 6 SPLT 77X108 (DRAPES) ×3 IMPLANT
DRAPE U-SHAPE 47X51 STRL (DRAPES) ×2 IMPLANT
DRESSING AQUACEL AG SP 3.5X10 (GAUZE/BANDAGES/DRESSINGS) ×1 IMPLANT
DRSG AQUACEL AG ADV 3.5X10 (GAUZE/BANDAGES/DRESSINGS) ×2 IMPLANT
DRSG AQUACEL AG ADV 3.5X14 (GAUZE/BANDAGES/DRESSINGS) IMPLANT
DRSG AQUACEL AG SP 3.5X10 (GAUZE/BANDAGES/DRESSINGS) ×2
DRSG PAD ABDOMINAL 8X10 ST (GAUZE/BANDAGES/DRESSINGS) ×4 IMPLANT
DURAPREP 26ML APPLICATOR (WOUND CARE) ×4 IMPLANT
ELECT CAUTERY BLADE 6.4 (BLADE) ×2 IMPLANT
ELECT REM PT RETURN 9FT ADLT (ELECTROSURGICAL) ×2
ELECTRODE REM PT RTRN 9FT ADLT (ELECTROSURGICAL) ×1 IMPLANT
GAUZE SPONGE 4X4 12PLY STRL (GAUZE/BANDAGES/DRESSINGS) ×2 IMPLANT
GAUZE SPONGE 4X4 12PLY STRL LF (GAUZE/BANDAGES/DRESSINGS) ×2 IMPLANT
GLOVE BIOGEL PI IND STRL 7.5 (GLOVE) ×1 IMPLANT
GLOVE BIOGEL PI IND STRL 8 (GLOVE) ×1 IMPLANT
GLOVE BIOGEL PI INDICATOR 7.5 (GLOVE) ×1
GLOVE BIOGEL PI INDICATOR 8 (GLOVE) ×1
GLOVE ECLIPSE 7.0 STRL STRAW (GLOVE) ×2 IMPLANT
GLOVE SURG ORTHO 8.0 STRL STRW (GLOVE) ×2 IMPLANT
GOWN STRL REUS W/ TWL LRG LVL3 (GOWN DISPOSABLE) ×3 IMPLANT
GOWN STRL REUS W/TWL LRG LVL3 (GOWN DISPOSABLE) ×3
HANDPIECE INTERPULSE COAX TIP (DISPOSABLE)
HOOD PEEL AWAY FLYTE STAYCOOL (MISCELLANEOUS) ×6 IMPLANT
IMMOBILIZER KNEE 20 (SOFTGOODS) IMPLANT
IMMOBILIZER KNEE 22 UNIV (SOFTGOODS) IMPLANT
IMMOBILIZER KNEE 24 THIGH 36 (MISCELLANEOUS) IMPLANT
IMMOBILIZER KNEE 24 UNIV (MISCELLANEOUS)
KIT BASIN OR (CUSTOM PROCEDURE TRAY) ×2 IMPLANT
KIT ROOM TURNOVER OR (KITS) ×2 IMPLANT
MANIFOLD NEPTUNE II (INSTRUMENTS) ×2 IMPLANT
NDL SAFETY ECLIPSE 18X1.5 (NEEDLE) ×1 IMPLANT
NEEDLE HYPO 18GX1.5 SHARP (NEEDLE) ×1
NEEDLE HYPO 22GX1.5 SAFETY (NEEDLE) ×2 IMPLANT
NEEDLE SPNL 18GX3.5 QUINCKE PK (NEEDLE) ×2 IMPLANT
NS IRRIG 1000ML POUR BTL (IV SOLUTION) ×4 IMPLANT
PACK TOTAL JOINT (CUSTOM PROCEDURE TRAY) ×2 IMPLANT
PAD ABD 8X10 STRL (GAUZE/BANDAGES/DRESSINGS) ×2 IMPLANT
PAD ARMBOARD 7.5X6 YLW CONV (MISCELLANEOUS) ×4 IMPLANT
PAD CAST 4YDX4 CTTN HI CHSV (CAST SUPPLIES) ×1 IMPLANT
PADDING CAST ABS 6INX4YD NS (CAST SUPPLIES) ×1
PADDING CAST ABS COTTON 6X4 NS (CAST SUPPLIES) ×1 IMPLANT
PADDING CAST COTTON 4X4 STRL (CAST SUPPLIES) ×1
PADDING CAST COTTON 6X4 STRL (CAST SUPPLIES) ×2 IMPLANT
SET HNDPC FAN SPRY TIP SCT (DISPOSABLE) IMPLANT
SPONGE LAP 18X18 X RAY DECT (DISPOSABLE) IMPLANT
STRIP CLOSURE SKIN 1/2X4 (GAUZE/BANDAGES/DRESSINGS) ×4 IMPLANT
SUCTION FRAZIER HANDLE 10FR (MISCELLANEOUS) ×1
SUCTION TUBE FRAZIER 10FR DISP (MISCELLANEOUS) ×1 IMPLANT
SUT MNCRL AB 3-0 PS2 18 (SUTURE) ×2 IMPLANT
SUT VIC AB 0 CT1 27 (SUTURE) ×3
SUT VIC AB 0 CT1 27XBRD ANBCTR (SUTURE) ×3 IMPLANT
SUT VIC AB 1 CT1 27 (SUTURE) ×5
SUT VIC AB 1 CT1 27XBRD ANBCTR (SUTURE) ×5 IMPLANT
SUT VIC AB 2-0 CT1 27 (SUTURE) ×4
SUT VIC AB 2-0 CT1 TAPERPNT 27 (SUTURE) ×4 IMPLANT
SYR 30ML LL (SYRINGE) ×6 IMPLANT
SYR TB 1ML LUER SLIP (SYRINGE) ×2 IMPLANT
TOWEL OR 17X24 6PK STRL BLUE (TOWEL DISPOSABLE) ×4 IMPLANT
TOWEL OR 17X26 10 PK STRL BLUE (TOWEL DISPOSABLE) ×4 IMPLANT
TRAY CATH 16FR W/PLASTIC CATH (SET/KITS/TRAYS/PACK) IMPLANT
WATER STERILE IRR 1000ML POUR (IV SOLUTION) IMPLANT

## 2017-03-31 NOTE — Progress Notes (Signed)
I/o cath: 810 mls yellow urin/ patient had no sensation of needing to void, yet dribbling urine/ bladder scan x 2 shows > 79mls

## 2017-03-31 NOTE — Brief Op Note (Signed)
03/31/2017  10:49 AM  PATIENT:  Cody Floyd  76 y.o. male  PRE-OPERATIVE DIAGNOSIS:  LEFT KNEE OSTEOARTHRITIS  POST-OPERATIVE DIAGNOSIS:  LEFT KNEE OSTEOARTHRITIS  PROCEDURE:  Procedure(s): LEFT TOTAL KNEE ARTHROPLASTY  SURGEON:  Surgeon(s): Marlou Sa, Tonna Corner, MD  ASSISTANT: Laure Kidney rnfa  ANESTHESIA:   spinal  EBL: 50 ml    Total I/O In: 1000 [I.V.:1000] Out: 10 [Blood:10]  BLOOD ADMINISTERED: none  DRAINS: none   LOCAL MEDICATIONS USED:  exparel  SPECIMEN:  No Specimen  COUNTS:  YES  TOURNIQUET:   Total Tourniquet Time Documented: Thigh (Left) - 95 minutes Total: Thigh (Left) - 95 minutes   DICTATION: .Other Dictation: Dictation Number [id number]  PLAN OF CARE: Admit to inpatient   PATIENT DISPOSITION:  PACU - hemodynamically stable

## 2017-03-31 NOTE — Transfer of Care (Signed)
Immediate Anesthesia Transfer of Care Note  Patient: Cody Floyd  Procedure(s) Performed: Procedure(s): LEFT TOTAL KNEE ARTHROPLASTY (Left)  Patient Location: PACU  Anesthesia Type:Spinal  Level of Consciousness: drowsy and patient cooperative  Airway & Oxygen Therapy: Patient Spontanous Breathing and Patient connected to face mask oxygen  Post-op Assessment: Report given to RN and Post -op Vital signs reviewed and stable  Post vital signs: Reviewed and stable  Last Vitals:  Vitals:   03/31/17 0621 03/31/17 1046  BP: (!) 182/78   Pulse: 66   Resp: 20   Temp: 37 C (!) 36.3 C    Last Pain:  Vitals:   03/31/17 1046  TempSrc: Tympanic         Complications: No apparent anesthesia complications

## 2017-03-31 NOTE — Evaluation (Signed)
Physical Therapy Evaluation Patient Details Name: Cody Floyd MRN: [id number] DOB: 1941-06-29 Today's Date: 03/31/2017   History of Present Illness  Pt is a 76 y/o male s/p elective L TKA. PMH includes arthritis, HTN, vertigo, and back surgery.   Clinical Impression  Pt s/p surgery above with deficits below. PTA, pt was using walking stick for ambulation. Upon eval, pt limited by post op pain and weakness, as well as, decreased balance. Pt required from min guard to min A for functional mobility. Reports his wife will be able to assist as needed at d/c. Has all necessary DME at home. Follow up recommendations per MD arrangements. Will continue to follow acutely to maximize functional mobility independence and safety.     Follow Up Recommendations DC plan and follow up therapy as arranged by surgeon;Supervision/Assistance - 24 hour    Equipment Recommendations  None recommended by PT    Recommendations for Other Services       Precautions / Restrictions Precautions Precautions: Knee Precaution Booklet Issued: Yes (comment) Precaution Comments: Reviewed supine ther ex with pt.  Restrictions Weight Bearing Restrictions: Yes LLE Weight Bearing: Weight bearing as tolerated      Mobility  Bed Mobility Overal bed mobility: Needs Assistance Bed Mobility: Supine to Sit;Sit to Supine     Supine to sit: Supervision Sit to supine: Supervision   General bed mobility comments: Supervision for safety   Transfers Overall transfer level: Needs assistance Equipment used: Rolling walker (2 wheeled) Transfers: Sit to/from Stand Sit to Stand: Min assist         General transfer comment: Min A for steadying once standing. Verbal cues for safe hand placement.   Ambulation/Gait Ambulation/Gait assistance: Min guard Ambulation Distance (Feet): 25 Feet Assistive device: Rolling walker (2 wheeled) Gait Pattern/deviations: Step-to pattern;Decreased step length - right;Decreased step length  - left;Decreased weight shift to left;Antalgic Gait velocity: Decreased Gait velocity interpretation: Below normal speed for age/gender General Gait Details: Slow, antalgic gait secondary to post op pain and weakness. Required verbal cues for sequencing with use of RW. Encouraged increased WB on LLE during gait to increase R step length.   Stairs            Wheelchair Mobility    Modified Rankin (Stroke Patients Only)       Balance Overall balance assessment: Needs assistance Sitting-balance support: No upper extremity supported;Feet supported Sitting balance-Leahy Scale: Good     Standing balance support: Bilateral upper extremity supported;During functional activity Standing balance-Leahy Scale: Poor Standing balance comment: Reliant on UE on RW for support                              Pertinent Vitals/Pain Pain Assessment: 0-10 Pain Score: 8  Pain Location: L knee  Pain Descriptors / Indicators: Aching;Operative site guarding;Sore Pain Intervention(s): Limited activity within patient's tolerance;Monitored during session;Repositioned    Home Living Family/patient expects to be discharged to:: Private residence Living Arrangements: Spouse/significant other Available Help at Discharge: Family;Available 24 hours/day Type of Home: House Home Access: Stairs to enter Entrance Stairs-Rails: None Entrance Stairs-Number of Steps: 3 Home Layout: One level Home Equipment: Walker - 2 wheels;Bedside commode;Other (comment) (walking stick )      Prior Function Level of Independence: Independent with assistive device(s)         Comments: Used walking stick      Hand Dominance   Dominant Hand: Right    Extremity/Trunk Assessment  Upper Extremity Assessment Upper Extremity Assessment: Defer to OT evaluation    Lower Extremity Assessment Lower Extremity Assessment: LLE deficits/detail LLE Deficits / Details: Deficits consitent with post op pain and  weakness. Able to perform exercise below. Sensory in tact.     Cervical / Trunk Assessment Cervical / Trunk Assessment: Normal  Communication   Communication: No difficulties  Cognition Arousal/Alertness: Awake/alert Behavior During Therapy: WFL for tasks assessed/performed Overall Cognitive Status: Within Functional Limits for tasks assessed                                        General Comments General comments (skin integrity, edema, etc.): Pt's wife present during session.     Exercises Total Joint Exercises Ankle Circles/Pumps: AROM;Both;10 reps;Supine Quad Sets: AROM;Left;10 reps;Supine Towel Squeeze: AROM;Both;10 reps;Supine Short Arc Quad: AROM;Left;10 reps;Supine Heel Slides: AROM;Left;10 reps;Supine (partial range ) Hip ABduction/ADduction: AROM;Left;10 reps;Supine   Assessment/Plan    PT Assessment Patient needs continued PT services  PT Problem List Decreased strength;Decreased range of motion;Decreased balance;Decreased mobility;Decreased knowledge of use of DME;Decreased knowledge of precautions;Pain       PT Treatment Interventions DME instruction;Gait training;Stair training;Functional mobility training;Therapeutic activities;Therapeutic exercise;Balance training;Neuromuscular re-education;Patient/family education    PT Goals (Current goals can be found in the Care Plan section)  Acute Rehab PT Goals Patient Stated Goal: to go home  PT Goal Formulation: With patient Time For Goal Achievement: 04/07/17 Potential to Achieve Goals: Good    Frequency 7X/week   Barriers to discharge        Co-evaluation               AM-PAC PT "6 Clicks" Daily Activity  Outcome Measure Difficulty turning over in bed (including adjusting bedclothes, sheets and blankets)?: A Little Difficulty moving from lying on back to sitting on the side of the bed? : A Little Difficulty sitting down on and standing up from a chair with arms (e.g., wheelchair,  bedside commode, etc,.)?: Total Help needed moving to and from a bed to chair (including a wheelchair)?: A Little Help needed walking in hospital room?: A Little Help needed climbing 3-5 steps with a railing? : A Little 6 Click Score: 16    End of Session Equipment Utilized During Treatment: Gait belt Activity Tolerance: Patient tolerated treatment well Patient left: in bed;with call bell/phone within reach;with family/visitor present Nurse Communication: Mobility status PT Visit Diagnosis: Other abnormalities of gait and mobility (R26.89);Pain Pain - Right/Left: Left Pain - part of body: Knee    Time: 1745-1815 PT Time Calculation (min) (ACUTE ONLY): 30 min   Charges:   PT Evaluation $PT Eval Low Complexity: 1 Low PT Treatments $Gait Training: 8-22 mins   PT G Codes:        Leighton Ruff, PT, DPT  Acute Rehabilitation Services  Pager: [phone]   Rudean Hitt 03/31/2017, 6:42 PM

## 2017-03-31 NOTE — Anesthesia Postprocedure Evaluation (Signed)
Anesthesia Post Note  Patient: Cody Floyd  Procedure(s) Performed: Procedure(s) (LRB): LEFT TOTAL KNEE ARTHROPLASTY (Left)     Patient location during evaluation: PACU Anesthesia Type: Spinal Level of consciousness: oriented and awake and alert Pain management: pain level controlled Vital Signs Assessment: post-procedure vital signs reviewed and stable Respiratory status: spontaneous breathing, respiratory function stable and patient connected to nasal cannula oxygen Cardiovascular status: blood pressure returned to baseline and stable Postop Assessment: no headache and no backache Anesthetic complications: no    Last Vitals:  Vitals:   03/31/17 0621 03/31/17 1046  BP: (!) 182/78   Pulse: 66   Resp: 20   Temp: 37 C (!) 36.3 C    Last Pain:  Vitals:   03/31/17 1046  TempSrc: Tympanic                 Taim Wurm

## 2017-03-31 NOTE — Op Note (Signed)
NAME:  KRISTIN, LAMAGNA NO.:  [id number]  MEDICAL RECORD NO.:  [id number]  LOCATION:  [facility]                         FACILITY:  [facility]  PHYSICIAN:  Anderson Malta, M.D.    DATE OF BIRTH:  1941-03-07  DATE OF PROCEDURE: DATE OF DISCHARGE:                              OPERATIVE REPORT   PREOPERATIVE DIAGNOSIS:  Left knee arthritis.  POSTOPERATIVE DIAGNOSIS:  Left knee arthritis.  PROCEDURE:  Left total knee replacement utilizing Stryker press-fit posterior cruciate retaining 7 femur, 6 tibia, 9 mm polyethylene insert, 38 mm press-fit, 3-pegged patella.  SURGEON:  Anderson Malta, M.D.  ASSISTANT:  Laure Kidney.  ANESTHESIA:  Spinal.  INDICATIONS:  Cordarro Spinnato is a 76 year old patient with end-stage left knee arthritis, presents for operative management after explanation of risks and benefits.  PROCEDURE IN DETAIL:  The patient was brought to the operating room where general anesthetic was induced.  Preoperative antibiotics administered.  Time-out was called.  Left leg prescrubbed with alcohol and Betadine, allowed to air dry.  Prepped with DuraPrep solution and draped in a sterile manner.  Charlie Pitter was used to cover the operative field.  Leg was then elevated and exsanguinated with the Esmarch wrap. Tourniquet was inflated.  Time-out was called.  An anterior approach to the knee was made.  Skin and subcutaneous tissue were sharply divided. Median parapatellar approach was made and marked with a #1 Vicryl suture.  Fat pad partially excised.  Lateral patellofemoral ligament released.  Minimal medial soft tissue dissection performed.  Soft tissue removed off the anterior distal femur.  Intramedullary alignment then used on the tibia and femur to make cuts perpendicular to the mechanical axis.  An 11 mm cut based on the lateral tibial plateau was made with the collaterals.  Posterior neurovascular structures protected.  A 10 mm cut off the distal femur also made.   Chamfer cuts and anterior-posterior cuts were then made after sizing it to a size 7.  The tibia was then sized to a 6 and press-fit tibia was prepared.  With trial components in position, the patella was cut from 28 mm down to 17.  A 38, 3-pegged trial patella was placed.  Knee achieved full extension, full flexion, good stability to varus and valgus stress at 0 and 30 degrees and 90 degrees.  Patellar tracking excellent with no thumbs technique.  At this time, trial components were removed.  Thorough irrigation performed. Exparel placed.  Tranexamic acid sponge placed.  The true components were then placed in the same alignment position with same stability parameters.  Tourniquet released.  Bleeding points encountered and controlled using electrocautery.  Thorough irrigation again performed and the arthrotomy was closed using #1 Vicryl suture followed by interrupted inverted 0 Vicryl suture, 2-0 Vicryl suture, and 3-0 Monocryl.  Steri-Strips and Aquacel dressing placed.  The patient tolerated the procedure well without immediate complication, transferred to the recovery room in stable condition.     Anderson Malta, M.D.     GSD/MEDQ  D:  03/31/2017  T:  03/31/2017  Job:  [id number]

## 2017-03-31 NOTE — Anesthesia Procedure Notes (Addendum)
Anesthesia Regional Block: Adductor canal block   Pre-Anesthetic Checklist: ,, timeout performed, Correct Patient, Correct Site, Correct Laterality, Correct Procedure, Correct Position, site marked, Risks and benefits discussed,  Surgical consent,  Pre-op evaluation,  At surgeon's request and post-op pain management  Laterality: Left  Prep: chloraprep       Needles:  Injection technique: Single-shot  Needle Type: Echogenic Stimulator Needle     Needle Length: 5cm  Needle Gauge: 22     Additional Needles:   Procedures: ultrasound guided, nerve stimulator,,,,,,  Narrative:  Start time: 03/31/2017 7:07 AM End time: 03/31/2017 7:17 AM Injection made incrementally with aspirations every 5 mL.  Performed by: Personally  Anesthesiologist: Mayleigh Tetrault  Additional Notes: Functioning IV was confirmed and monitors were applied.  A 53mm 22ga Arrow echogenic stimulator needle was used. Sterile prep and drape,hand hygiene and sterile gloves were used. Ultrasound guidance: relevant anatomy identified, needle position confirmed, local anesthetic spread visualized around nerve(s)., vascular puncture avoided.  Image printed for medical record. Negative aspiration and negative test dose prior to incremental administration of local anesthetic. The patient tolerated the procedure well.

## 2017-03-31 NOTE — Progress Notes (Signed)
Moved to pacu 16 / awaiting bed assignment/family has visited

## 2017-03-31 NOTE — H&P (Signed)
TOTAL KNEE ADMISSION H&P  Patient is being admitted for left total knee arthroplasty.  Subjective:  Chief Complaint:left knee pain.  HPI: Cody Floyd, 76 y.o. male, has a history of pain and functional disability in the left knee due to arthritis and has failed non-surgical conservative treatments for greater than 12 weeks to includeNSAID's and/or analgesics, corticosteriod injections, flexibility and strengthening excercises and activity modification.  Onset of symptoms was gradual, starting 9 years ago with gradually worsening course since that time. The patient noted no past surgery on the left knee(s).  Patient currently rates pain in the left knee(s) at 9 out of 10 with activity. Patient has night pain, worsening of pain with activity and weight bearing, pain that interferes with activities of daily living, pain with passive range of motion, crepitus and joint swelling.  Patient has evidence of subchondral sclerosis and joint space narrowing by imaging studies. This patient has had Multiple modalities of nonoperative treatment without success. There is no active infection.  Patient Active Problem List   Diagnosis Date Noted  . Spinal stenosis, lumbar region, with neurogenic claudication 05/29/2014    Class: Chronic  . Left lumbar radiculopathy 05/29/2014    Class: Chronic   Past Medical History:  Diagnosis Date  . Arthritis   . GERD (gastroesophageal reflux disease)   . History of BPH   . Hyperlipidemia   . Hypertension   . RLS (restless legs syndrome)   . Ventral hernia   . Vertigo     Past Surgical History:  Procedure Laterality Date  . BACK SURGERY    . HAMMER TOE SURGERY Left 2012  . LUMBAR LAMINECTOMY/DECOMPRESSION MICRODISCECTOMY N/A 05/29/2014   Procedure: CENTRAL LUMBAR LAMINECTOMY L3-4 and L4-5;  Surgeon: Jessy Oto, MD;  Location: [facility];  Service: Orthopedics;  Laterality: N/A;    Prescriptions Prior to Admission  Medication Sig Dispense Refill Last Dose  .  aspirin 325 MG tablet Take 325 mg by mouth daily.   Past Week at Unknown time  . latanoprost (XALATAN) 0.005 % ophthalmic solution INSTILL 1 DROP INTO BOTH EYES NIGHTLY  0 03/30/2017 at Unknown time  . lisinopril (PRINIVIL,ZESTRIL) 10 MG tablet Take 10 mg by mouth daily.   Past Week at Unknown time  . omeprazole (PRILOSEC OTC) 20 MG tablet Take 20 mg by mouth daily.   03/31/2017 at 0500  . propranolol (INNOPRAN XL) 120 MG 24 hr capsule Take 120 mg by mouth daily as needed (for BP and migraine).    03/31/2017 at 0500  . meclizine (ANTIVERT) 25 MG tablet Take 25 mg by mouth 3 (three) times daily as needed for dizziness.   More than a month at Unknown time   Allergies  Allergen Reactions  . No Known Allergies     Social History  Substance Use Topics  . Smoking status: Never Smoker  . Smokeless tobacco: Never Used  . Alcohol use No     Comment: 01/23/16 couple beers a week    Family History  Problem Relation Age of Onset  . Osteoarthritis Mother      Review of Systems  Musculoskeletal: Positive for joint pain.  All other systems reviewed and are negative.   Objective:  Physical Exam  Constitutional: He appears well-developed.  HENT:  Head: Normocephalic.  Eyes: Pupils are equal, round, and reactive to light.  Neck: Normal range of motion.  Cardiovascular: Normal rate.   Respiratory: Effort normal.  Neurological: He is alert.  Skin: Skin is warm.  Psychiatric:  He has a normal mood and affect.  Examination the left knee demonstrates intact extensor mechanism palpable pedal pulses skin is intact.  Mild effusion is present.  Range of motion is about 5-110.  There is medial and lateral joint line tenderness present.  Vital signs in last 24 hours: Temp:  [98.6 F (37 C)] 98.6 F (37 C) (08/07 0621) Pulse Rate:  [66] 66 (08/07 0621) Resp:  [20] 20 (08/07 0621) BP: (182)/(78) 182/78 (08/07 0621) SpO2:  [96 %] 96 % (08/07 0621) Weight:  [227 lb (103 kg)] 227 lb (103 kg) (08/07  0605)  Labs:   Estimated body mass index is 29.95 kg/m as calculated from the following:   Height as of this encounter: 6\' 1"  (1.854 m).   Weight as of this encounter: 227 lb (103 kg).   Imaging Review Plain radiographs demonstrate moderate degenerative joint disease of the left knee(s). The overall alignment ismild varus. The bone quality appears to be good for age and reported activity level.  Assessment/Plan:  End stage arthritis, left knee   The patient history, physical examination, clinical judgment of the provider and imaging studies are consistent with end stage degenerative joint disease of the left knee(s) and total knee arthroplasty is deemed medically necessary. The treatment options including medical management, injection therapy arthroscopy and arthroplasty were discussed at length. The risks and benefits of total knee arthroplasty were presented and reviewed. The risks due to aseptic loosening, infection, stiffness, patella tracking problems, thromboembolic complications and other imponderables were discussed. The patient acknowledged the explanation, agreed to proceed with the plan and consent was signed. Patient is being admitted for inpatient treatment for surgery, pain control, PT, OT, prophylactic antibiotics, VTE prophylaxis, progressive ambulation and ADL's and discharge planning. The patient is planning to be discharged home with home health services

## 2017-03-31 NOTE — Anesthesia Procedure Notes (Signed)
Spinal  Patient location during procedure: OR Start time: 03/31/2017 7:49 AM End time: 03/31/2017 7:55 AM Staffing Anesthesiologist: Eesha Schmaltz Preanesthetic Checklist Completed: patient identified, site marked, surgical consent, pre-op evaluation, timeout performed, IV checked, risks and benefits discussed and monitors and equipment checked Spinal Block Patient position: sitting Prep: DuraPrep Patient monitoring: heart rate, cardiac monitor, continuous pulse ox and blood pressure Approach: midline Location: L5-S1 Injection technique: single-shot Needle Needle type: Sprotte  Needle gauge: 24 G Needle length: 9 cm Assessment Sensory level: T4

## 2017-04-01 ENCOUNTER — Encounter (HOSPITAL_COMMUNITY): Payer: Self-pay | Admitting: Rehabilitation

## 2017-04-01 ENCOUNTER — Encounter (HOSPITAL_COMMUNITY): Payer: Self-pay | Admitting: Orthopedic Surgery

## 2017-04-01 MED ORDER — OXYCODONE HCL 5 MG PO TABS
5.0000 mg | ORAL_TABLET | ORAL | Status: DC | PRN
  Administered 2017-04-01 – 2017-04-02 (×4): 10 mg via ORAL
  Filled 2017-04-01 (×4): qty 2

## 2017-04-01 MED ORDER — OXYCODONE HCL 5 MG PO TABS: 5.0000 mg | ORAL_TABLET | ORAL | 0 refills | Status: DC | PRN

## 2017-04-01 MED ORDER — METHOCARBAMOL 500 MG PO TABS: 500.0000 mg | ORAL_TABLET | Freq: Four times a day (QID) | ORAL | 0 refills | Status: DC | PRN

## 2017-04-01 NOTE — Plan of Care (Signed)
Problem: Safety: Goal: Ability to remain free from injury will improve Patient's belongings and call light in reach. Patient call for assistance when needed.

## 2017-04-01 NOTE — Progress Notes (Signed)
Subjective: Patient stable.  Pain reasonably well-controlled.   Objective: Vital signs in last 24 hours: Temp:  [97.3 F (36.3 C)-100.5 F (38.1 C)] 98.9 F (37.2 C) (08/08 0650) Pulse Rate:  [60-77] 65 (08/08 0650) Resp:  [16-17] 17 (08/08 0650) BP: (135-197)/(56-83) 138/56 (08/08 0650) SpO2:  [96 %-97 %] 96 % (08/08 0650)  Intake/Output from previous day: 08/07 0701 - 08/08 0700 In: 2190 [P.O.:690; I.V.:1500] Out: 1670 [Urine:1660; Blood:10] Intake/Output this shift: No intake/output data recorded.  Exam:  Dorsiflexion/Plantar flexion intact  Labs: No results for input(s): HGB in the last 72 hours. No results for input(s): WBC, RBC, HCT, PLT in the last 72 hours. No results for input(s): NA, K, CL, CO2, BUN, CREATININE, GLUCOSE, CALCIUM in the last 72 hours. No results for input(s): LABPT, INR in the last 72 hours.  Assessment/Plan: Plan discharge either tomorrow or Friday.  Prescriptions are on the chart.  He will need to have the Ace wrap removed from his knee and leg later today.   G Scott Marget Outten 04/01/2017, 8:40 AM

## 2017-04-01 NOTE — Evaluation (Signed)
Occupational Therapy Evaluation Patient Details Name: Cody Floyd MRN: [id number] DOB: 29-Jan-1941 Today's Date: 04/01/2017    History of Present Illness Pt is a 76 y/o male s/p elective L TKA. PMH includes arthritis, HTN, vertigo, and back surgery.    Clinical Impression   PTA, pt was living with his significant other and was independent. Currently, pt requires Min A for LB ADLs and Min Guard-Min A for functional mobility using RW. Provided education on LB ADLs and toilet transfer. Pt would benefit further OT to facilitate safe dc and address shower transfers. Recommend dc home with 24 hour supervision once medically stable per physician.      Follow Up Recommendations  DC plan and follow up therapy as arranged by surgeon;Supervision/Assistance - 24 hour    Equipment Recommendations  None recommended by OT    Recommendations for Other Services PT consult     Precautions / Restrictions Precautions Precautions: Knee Precaution Booklet Issued: Yes (comment) Precaution Comments: Reviewed resting with knee in extension Restrictions Weight Bearing Restrictions: Yes LLE Weight Bearing: Weight bearing as tolerated      Mobility Bed Mobility Overal bed mobility: Needs Assistance Bed Mobility: Supine to Sit;Sit to Supine     Supine to sit: Min assist Sit to supine: Min assist   General bed mobility comments: Set up bed to simualte home with [facility] and bed rails lowered. Pt requiring min a to elevate trunk. For sit>supine, MIn A for manage LLE  Transfers Overall transfer level: Needs assistance Equipment used: Rolling walker (2 wheeled) Transfers: Sit to/from Stand Sit to Stand: Min guard         General transfer comment: Pt wanting to pull up on walker. Requiring VCs to place R foot back, weight shift, and hand placement    Balance Overall balance assessment: Needs assistance Sitting-balance support: No upper extremity supported;Feet supported Sitting balance-Leahy Scale:  Good     Standing balance support: Bilateral upper extremity supported;During functional activity Standing balance-Leahy Scale: Poor Standing balance comment: Reliant on UE on RW for support                            ADL either performed or assessed with clinical judgement   ADL Overall ADL's : Needs assistance/impaired Eating/Feeding: Set up;Sitting   Grooming: Set up;Sitting   Upper Body Bathing: Sitting;Set up   Lower Body Bathing: Minimal assistance;Sit to/from stand   Upper Body Dressing : Set up;Sitting   Lower Body Dressing: Minimal assistance;Sit to/from stand Lower Body Dressing Details (indicate cue type and reason): Pt had pants on upon OT arrival. Discussed how he donned pants this morning. Girlfriend assisted by bringing them over his feet. Pt demonstrating decreased ROM and would need Min A for LB dressing. Educated pt that he could use reacher for donning pants. Toilet Transfer: Minimal assistance;Ambulation;Regular Toilet;Grab bars;RW Armed forces technical officer Details (indicate cue type and reason): Pt performed toilet transfer with Min A and use of grab bars. Educated pt that at home he will need to use 3N1 over toilet to increase safety.          Functional mobility during ADLs: Min guard;Rolling walker General ADL Comments: Pt with decreased fucntional performance post TKA. Feel pt will progress well with time. Pt will need education on walk-in shower transfer.     Vision         Perception     Praxis      Pertinent Vitals/Pain Pain Assessment: 0-10  Pain Score: 7  Pain Location: L knee Pain Descriptors / Indicators: Aching ("Comes and goes") Pain Intervention(s): Monitored during session;Limited activity within patient's tolerance;Repositioned;RN gave pain meds during session     Hand Dominance Right   Extremity/Trunk Assessment Upper Extremity Assessment Upper Extremity Assessment: Overall WFL for tasks assessed   Lower Extremity  Assessment Lower Extremity Assessment: Defer to PT evaluation   Cervical / Trunk Assessment Cervical / Trunk Assessment: Normal   Communication Communication Communication: No difficulties   Cognition Arousal/Alertness: Awake/alert Behavior During Therapy: WFL for tasks assessed/performed Overall Cognitive Status: Within Functional Limits for tasks assessed                                     General Comments  Requesting to return to bed    Exercises    Shoulder Instructions      Home Living Family/patient expects to be discharged to:: Private residence Living Arrangements: Spouse/significant other (Girlfriend) Available Help at Discharge: Family;Available 24 hours/day Type of Home: House Home Access: Stairs to enter [employer] of Steps: 3 Entrance Stairs-Rails: None Home Layout: One level     Bathroom Shower/Tub: Occupational psychologist: Standard     Home Equipment: Environmental consultant - 2 wheels;Bedside commode;Tub bench;Grab bars - tub/shower;Adaptive equipment;Other (comment);Hand held shower head Adaptive Equipment: Reacher        Prior Functioning/Environment Level of Independence: Independent with assistive device(s)        Comments: ADLs, IADLs, and driving        OT Problem List: Decreased range of motion;Decreased activity tolerance;Impaired balance (sitting and/or standing);Decreased safety awareness;Decreased knowledge of use of DME or AE;Decreased knowledge of precautions;Pain      OT Treatment/Interventions: Self-care/ADL training;Therapeutic exercise;Energy conservation;DME and/or AE instruction;Therapeutic activities;Patient/family education    OT Goals(Current goals can be found in the care plan section) Acute Rehab OT Goals Patient Stated Goal: go home OT Goal Formulation: With patient Time For Goal Achievement: 04/15/17 Potential to Achieve Goals: Good ADL Goals Pt Will Perform Grooming: with set-up;with  supervision;standing Pt Will Perform Lower Body Dressing: with min guard assist;sit to/from stand;with adaptive equipment Pt Will Perform Tub/Shower Transfer: Shower transfer;tub bench;ambulating;rolling walker;with min guard assist  OT Frequency: Min 2X/week   Barriers to D/C:            Co-evaluation              AM-PAC PT "6 Clicks" Daily Activity     Outcome Measure Help from another person eating meals?: None Help from another person taking care of personal grooming?: A Little Help from another person toileting, which includes using toliet, bedpan, or urinal?: A Little Help from another person bathing (including washing, rinsing, drying)?: A Little Help from another person to put on and taking off regular upper body clothing?: None Help from another person to put on and taking off regular lower body clothing?: A Little 6 Click Score: 20   End of Session Equipment Utilized During Treatment: Gait belt;Rolling walker CPM Left Knee CPM Left Knee: On Left Knee Flexion (Degrees): 70 Nurse Communication: Mobility status;Patient requests pain meds  Activity Tolerance: Patient tolerated treatment well;Patient limited by pain Patient left: in bed;with call bell/phone within reach;Other (comment) (CPM)  OT Visit Diagnosis: Unsteadiness on feet (R26.81);Other abnormalities of gait and mobility (R26.89);Muscle weakness (generalized) (M62.81);Pain Pain - Right/Left: Left Pain - part of body: Knee  Time: 1314-3888 OT Time Calculation (min): 23 min Charges:  OT General Charges $OT Visit: 1 Procedure OT Evaluation $OT Eval Low Complexity: 1 Procedure OT Treatments $Self Care/Home Management : 8-22 mins G-Codes:     Tabius Rood MSOT, OTR/L Acute Rehab Pager: [phone] Office: [place] 04/01/2017, 11:24 AM

## 2017-04-01 NOTE — Progress Notes (Signed)
Physical Therapy Treatment Patient Details Name: Cody Floyd MRN: [id number] DOB: 07-Sep-1940 Today's Date: 04/01/2017    History of Present Illness Pt is a 76 y/o male s/p elective L TKA. PMH includes arthritis, HTN, vertigo, and back surgery.     PT Comments    Pt motivated and eager to participate in PT. Pt with minimal active L knee ROM due to pain and stiffness. Pt hasn't been in CPM except for right out of surgery. Pt did increased amb tolerance to 150'. Pt return and placed in CPM to 70 deg to help promote available L Knee ROM. Acute PT to follow.    Follow Up Recommendations  DC plan and follow up therapy as arranged by surgeon;Supervision/Assistance - 24 hour     Equipment Recommendations  None recommended by PT    Recommendations for Other Services       Precautions / Restrictions Precautions Precautions: Knee Precaution Booklet Issued: Yes (comment) Precaution Comments: reviewed Restrictions Weight Bearing Restrictions: Yes LLE Weight Bearing: Weight bearing as tolerated    Mobility  Bed Mobility Overal bed mobility: Needs Assistance Bed Mobility: Supine to Sit;Sit to Supine     Supine to sit: Supervision Sit to supine: Supervision   General bed mobility comments: labored effort, used bed rails, able to bring L LE back into bed  Transfers Overall transfer level: Needs assistance Equipment used: Rolling walker (2 wheeled) Transfers: Sit to/from Stand Sit to Stand: Min guard         General transfer comment: v/c's for safe hand placement and optimal position at EOB adn R foot back. Pt  min guard to stabilize during transition of hands from bed to RW  Ambulation/Gait Ambulation/Gait assistance: Min guard Ambulation Distance (Feet): 150 Feet Assistive device: Rolling walker (2 wheeled) Gait Pattern/deviations: Step-to pattern;Step-through pattern Gait velocity: dec Gait velocity interpretation: Below normal speed for age/gender General Gait Details:  initially step through pattern then slow transition to step through with v/c's and tactile cues to keep forward motion of RW.   Stairs            Wheelchair Mobility    Modified Rankin (Stroke Patients Only)       Balance Overall balance assessment: Needs assistance Sitting-balance support: No upper extremity supported;Feet supported Sitting balance-Leahy Scale: Good     Standing balance support: Bilateral upper extremity supported;During functional activity Standing balance-Leahy Scale: Poor Standing balance comment: Reliant on UE on RW for support                             Cognition Arousal/Alertness: Awake/alert Behavior During Therapy: WFL for tasks assessed/performed Overall Cognitive Status: Within Functional Limits for tasks assessed                                        Exercises Total Joint Exercises Quad Sets: AROM;Left;10 reps;Supine Heel Slides: AAROM;Left;10 reps;Supine Knee Flexion: AROM;Left;10 reps;Supine Goniometric ROM: 11-51 active L knee flexion in supine with HOB flat    General Comments        Pertinent Vitals/Pain Pain Assessment: 0-10 Pain Score: 6  Pain Location: L knee Pain Descriptors / Indicators: Aching (sharp pain with WBing) Pain Intervention(s): Monitored during session    Home Living  Prior Function            PT Goals (current goals can now be found in the care plan section) Acute Rehab PT Goals Patient Stated Goal: go home    Frequency    7X/week      PT Plan Current plan remains appropriate    Co-evaluation              AM-PAC PT "6 Clicks" Daily Activity  Outcome Measure  Difficulty turning over in bed (including adjusting bedclothes, sheets and blankets)?: A Little Difficulty moving from lying on back to sitting on the side of the bed? : A Little Difficulty sitting down on and standing up from a chair with arms (e.g., wheelchair,  bedside commode, etc,.)?: A Little Help needed moving to and from a bed to chair (including a wheelchair)?: A Little Help needed walking in hospital room?: A Little Help needed climbing 3-5 steps with a railing? : A Little 6 Click Score: 18    End of Session Equipment Utilized During Treatment: Gait belt Activity Tolerance: Patient tolerated treatment well Patient left: in bed;with call bell/phone within reach;in CPM Nurse Communication: Mobility status PT Visit Diagnosis: Difficulty in walking, not elsewhere classified (R26.2) Pain - Right/Left: Left Pain - part of body: Knee     Time: 0922-0950 PT Time Calculation (min) (ACUTE ONLY): 28 min  Charges:  $Gait Training: 8-22 mins $Therapeutic Exercise: 8-22 mins                    G Codes:       Kittie Plater, PT, DPT Pager #: [phone] Office #: [phone]    [place] 04/01/2017, 10:01 AM

## 2017-04-01 NOTE — Progress Notes (Signed)
Orthopedic Tech Progress Note Patient Details:  Cody Floyd 04/02/1941 [id number]  CPM Left Knee CPM Left Knee: On Left Knee Flexion (Degrees): 72 Left Knee Extension (Degrees): 0 Additional Comments: Left knee    Kristopher Oppenheim 04/01/2017, 6:08 PM

## 2017-04-01 NOTE — Progress Notes (Signed)
Patient plan for discharge either tomorrow or Friday.  Prescriptions on chart.  Discharge orders done.

## 2017-04-01 NOTE — Discharge Instructions (Signed)
Information on my medicine - XARELTO® (Rivaroxaban) ° °This medication education was reviewed with me or my healthcare representative as part of my discharge preparation.  The pharmacist that spoke with me during my hospital stay was:  Orland Visconti Dien, RPH ° °Why was Xarelto® prescribed for you? °Xarelto® was prescribed for you to reduce the risk of blood clots forming after orthopedic surgery. The medical term for these abnormal blood clots is venous thromboembolism (VTE). ° °What do you need to know about xarelto® ? °Take your Xarelto® ONCE DAILY at the same time every day. °You may take it either with or without food. ° °If you have difficulty swallowing the tablet whole, you may crush it and mix in applesauce just prior to taking your dose. ° °Take Xarelto® exactly as prescribed by your doctor and DO NOT stop taking Xarelto® without talking to the doctor who prescribed the medication.  Stopping without other VTE prevention medication to take the place of Xarelto® may increase your risk of developing a clot. ° °After discharge, you should have regular check-up appointments with your healthcare provider that is prescribing your Xarelto®.   ° °What do you do if you miss a dose? °If you miss a dose, take it as soon as you remember on the same day then continue your regularly scheduled once daily regimen the next day. Do not take two doses of Xarelto® on the same day.  ° °Important Safety Information °A possible side effect of Xarelto® is bleeding. You should call your healthcare provider right away if you experience any of the following: °? Bleeding from an injury or your nose that does not stop. °? Unusual colored urine (red or dark brown) or unusual colored stools (red or black). °? Unusual bruising for unknown reasons. °? A serious fall or if you hit your head (even if there is no bleeding). ° °Some medicines may interact with Xarelto® and might increase your risk of bleeding while on Xarelto®. To help avoid  this, consult your healthcare provider or pharmacist prior to using any new prescription or non-prescription medications, including herbals, vitamins, non-steroidal anti-inflammatory drugs (NSAIDs) and supplements. ° °This website has more information on Xarelto®: www.xarelto.com. ° ° ° °

## 2017-04-02 ENCOUNTER — Encounter (HOSPITAL_COMMUNITY): Payer: Self-pay | Admitting: General Practice

## 2017-04-02 ENCOUNTER — Encounter (HOSPITAL_COMMUNITY): Payer: Self-pay | Admitting: Anesthesiology

## 2017-04-02 MED ORDER — MORPHINE SULFATE (PF) 4 MG/ML IV SOLN: 4.0000 mg | INTRAVENOUS | Status: DC | PRN

## 2017-04-02 NOTE — Addendum Note (Signed)
Addendum  created 04/02/17 1907 by Janeece Riggers, MD   Anesthesia Intra Blocks edited, Sign clinical note

## 2017-04-02 NOTE — Progress Notes (Signed)
Occupational Therapy Treatment Patient Details Name: Cody Floyd MRN: [id number] DOB: [birth year] Today's Date: 04/02/2017    History of present illness Pt is a 76 y/o male s/p elective L TKA. PMH includes arthritis, HTN, vertigo, and back surgery.    OT comments  Pt with difficulty performing simulated shower transfer this session due to L knee weakness/buckling; recommend sponge bathing initially. Educated pt and wife on walk in shower transfer technique; they verbalized understanding. D/c plan remains appropriate. Will continue to follow acutely.   Follow Up Recommendations  DC plan and follow up therapy as arranged by surgeon;Supervision/Assistance - 24 hour    Equipment Recommendations  None recommended by OT    Recommendations for Other Services      Precautions / Restrictions Precautions Precautions: Knee Precaution Booklet Issued: Yes (comment) Precaution Comments: Reviewed bone foam.  Restrictions Weight Bearing Restrictions: Yes LLE Weight Bearing: Weight bearing as tolerated       Mobility Bed Mobility Overal bed mobility: Needs Assistance Bed Mobility: Supine to Sit     Supine to sit: Supervision Sit to supine: Supervision   General bed mobility comments: HOB flat without use of bed rails. Increased time needed and painful.  Transfers Overall transfer level: Needs assistance Equipment used: Rolling walker (2 wheeled) Transfers: Sit to/from Stand Sit to Stand: Min guard         General transfer comment: Increased time. No physical assist but close guard for safety    Balance Overall balance assessment: Needs assistance Sitting-balance support: Feet supported Sitting balance-Leahy Scale: Good     Standing balance support: Bilateral upper extremity supported Standing balance-Leahy Scale: Poor Standing balance comment: RW for support                           ADL either performed or assessed with clinical judgement   ADL Overall ADL's  : Needs assistance/impaired               Lower Body Bathing Details (indicate cue type and reason): Reviewed LB dressing technique; L leg into clothing first. Wife to assist but encouraged attempting independently every day.                 Tub/ Shower Transfer: Minimal assistance;Ambulation;Shower Technical sales engineer Details (indicate cue type and reason): Educated pt and wife on walk in shower transfer technique and use of shower chair for safety. Pt with difficutly performing transfer this session due to L knee weakness/buckling; recommended sponge bathing for first few days prior to attempting shower transfer and performing dry run through with [facility] therapist. Functional mobility during ADLs: Minimal assistance;Rolling walker       Vision       Perception     Praxis      Cognition Arousal/Alertness: Awake/alert Behavior During Therapy: WFL for tasks assessed/performed Overall Cognitive Status: Within Functional Limits for tasks assessed                                          Exercises    Shoulder Instructions       General Comments      Pertinent Vitals/ Pain       Pain Assessment: Faces Pain Score: 7  Faces Pain Scale: Hurts even more Pain Location: L knee with mobility Pain Descriptors / Indicators: Aching;Grimacing;Guarding Pain Intervention(s): Monitored during session;Limited activity  within patient's tolerance;Repositioned  Home Living                                          Prior Functioning/Environment              Frequency  Min 2X/week        Progress Toward Goals  OT Goals(current goals can now be found in the care plan section)  Progress towards OT goals: Progressing toward goals  Acute Rehab OT Goals Patient Stated Goal: go home OT Goal Formulation: With patient  Plan Discharge plan remains appropriate    Co-evaluation                 AM-PAC PT "6 Clicks"  Daily Activity     Outcome Measure   Help from another person eating meals?: None Help from another person taking care of personal grooming?: A Little Help from another person toileting, which includes using toliet, bedpan, or urinal?: A Little Help from another person bathing (including washing, rinsing, drying)?: A Little Help from another person to put on and taking off regular upper body clothing?: None Help from another person to put on and taking off regular lower body clothing?: A Little 6 Click Score: 20    End of Session Equipment Utilized During Treatment: Rolling walker CPM Left Knee CPM Left Knee: On Left Knee Flexion (Degrees): 77 Left Knee Extension (Degrees): 0 Additional Comments: Left knee   OT Visit Diagnosis: Unsteadiness on feet (R26.81);Other abnormalities of gait and mobility (R26.89);Muscle weakness (generalized) (M62.81);Pain Pain - Right/Left: Left Pain - part of body: Knee   Activity Tolerance Patient tolerated treatment well;Patient limited by pain   Patient Left in bed;with call bell/phone within reach;with family/visitor present (sitting EOB)   Nurse Communication          Time: 4801-6553 OT Time Calculation (min): 13 min  Charges: OT General Charges $OT Visit: 1 Procedure OT Treatments $Self Care/Home Management : 8-22 mins  Barney Russomanno A. Ulice Brilliant, M.S., OTR/L Pager: [place] 04/02/2017, 11:39 AM

## 2017-04-02 NOTE — Care Management Note (Signed)
Case Management Note  Patient Details  Name: Cody Floyd MRN: [id number] Date of Birth: [birth year]  Subjective/Objective:   76 yr old gentleman s/p left total knee arthroplasty.              Action/Plan: Case manager spoke with patient concerning discharge plan and DME. Patient was preoperatively setup with [facility], no changes. DME has been delivered to his hoem, he will have family support at discharge. No further needs identified.    Expected Discharge Date:  04/02/17               Expected Discharge Plan:  [place]  In-House Referral:     Discharge planning Services  CM Consult  Post Acute Care Choice:  Home Health Choice offered to:  Patient  DME Arranged:  3-N-1, Walker rolling, CPM DME Agency:  [employer]  HH Arranged:  PT HH Agency:  [facility] [employer]  Status of Service:  Completed, signed off  If discussed at [employer], dates discussed:    Additional Comments:  Ninfa Meeker, RN 04/02/2017, 11:58 AM

## 2017-04-02 NOTE — Progress Notes (Signed)
Physical Therapy Treatment Patient Details Name: Cody Floyd MRN: [id number] DOB: 1940/10/07 Today's Date: 04/02/2017    History of Present Illness Pt is a 76 y/o male s/p elective L TKA. PMH includes arthritis, HTN, vertigo, and back surgery.     PT Comments    Pt performed well during am tx and progressing toward goals. Pt able to ambulate short distance secondary to feeling dizzy,min guard for stability and cueing to increase L dorsiflexion and L knee extension. Pt tolerated stair tx well and stated that he feels confident. Pt in CPM, with instructions to increase flexion degrees to 85 deg if pt can tolerate. Plan of care remains appropriate with focus for next tx on increase knee flexion and extension to help increase functional mobility.  Follow Up Recommendations  DC plan and follow up therapy as arranged by surgeon;Supervision/Assistance - 24 hour     Equipment Recommendations  None recommended by PT    Recommendations for Other Services       Precautions / Restrictions Precautions Precautions: Knee Precaution Booklet Issued: Yes (comment) Precaution Comments: Reviewed bone foam.  Restrictions Weight Bearing Restrictions: Yes LLE Weight Bearing: Weight bearing as tolerated    Mobility  Bed Mobility Overal bed mobility: Needs Assistance Bed Mobility: Sit to Supine       Sit to supine: Supervision   General bed mobility comments: Pt sitting EOB upon arrival. Pt able to sit to supine and self assit LLE into bed using RLE hook method.   Transfers Overall transfer level: Needs assistance Equipment used: Rolling walker (2 wheeled) Transfers: Sit to/from Stand Sit to Stand: Supervision         General transfer comment: Pt required min cues for safe hand placement. Pt tends to "flop" from stand to sit.   Ambulation/Gait Ambulation/Gait assistance: Min guard Ambulation Distance (Feet): 65 Feet Assistive device: Rolling walker (2 wheeled) Gait Pattern/deviations:  Step-to pattern;Decreased step length - right;Decreased dorsiflexion - left;Decreased weight shift to left;Antalgic Gait velocity: dec   General Gait Details: Pt presents with decreased step length on RLE and weight shift on LLE. Pt only able to ambulate short distance due to increase pain, required seated rest break in follow chair.    Stairs Stairs: Yes  Min assist Stair Management: No rails;Backwards Number of Stairs: 2 General stair comments: Min assist with max cueing for sequencing and RW placement, wife present during tx.   Wheelchair Mobility    Modified Rankin (Stroke Patients Only)       Balance Overall balance assessment: Needs assistance Sitting-balance support: No upper extremity supported;Feet supported Sitting balance-Leahy Scale: Good     Standing balance support: Bilateral upper extremity supported;During functional activity Standing balance-Leahy Scale: Poor Standing balance comment: Reliant on UE on RW for support                             Cognition Arousal/Alertness: Awake/alert Behavior During Therapy: WFL for tasks assessed/performed Overall Cognitive Status: Within Functional Limits for tasks assessed                                        Exercises Total Joint Exercises Short Arc Quad: AROM;Left;10 reps;Supine Goniometric ROM: Sitting edge of chair, L knee flexion at 83 deg, L knee extension grossly at 7 deg.     General Comments  Pertinent Vitals/Pain Pain Assessment: 0-10 Pain Score: 7  Pain Location: L knee Pain Descriptors / Indicators: Aching;Burning;Tightness Pain Intervention(s): Limited activity within patient's tolerance;Monitored during session;RN gave pain meds during session;Ice applied    Home Living                      Prior Function            PT Goals (current goals can now be found in the care plan section) Acute Rehab PT Goals Patient Stated Goal: go home PT Goal  Formulation: With patient/family Potential to Achieve Goals: Good Progress towards PT goals: Progressing toward goals    Frequency    7X/week      PT Plan Current plan remains appropriate    Co-evaluation              AM-PAC PT "6 Clicks" Daily Activity  Outcome Measure  Difficulty turning over in bed (including adjusting bedclothes, sheets and blankets)?: A Little Difficulty moving from lying on back to sitting on the side of the bed? : A Little Difficulty sitting down on and standing up from a chair with arms (e.g., wheelchair, bedside commode, etc,.)?: A Little Help needed moving to and from a bed to chair (including a wheelchair)?: A Little Help needed walking in hospital room?: A Little Help needed climbing 3-5 steps with a railing? : A Little 6 Click Score: 18    End of Session Equipment Utilized During Treatment: Gait belt Activity Tolerance: Patient tolerated treatment well Patient left: in bed;with call bell/phone within reach;in CPM   PT Visit Diagnosis: Difficulty in walking, not elsewhere classified (R26.2) Pain - Right/Left: Left Pain - part of body: Knee     Time: 1410-3013 PT Time Calculation (min) (ACUTE ONLY): 33 min  Charges:  $Gait Training: 8-22 mins $Therapeutic Exercise: 8-22 mins                    G CodesRandalyn Rhea, [place] 04/02/2017, 10:44 AM

## 2017-04-02 NOTE — Progress Notes (Signed)
Pt ready for discharge. Education/instructions reviewed with pt and all questions/concerns addressed. IV removed and belongings gathered. Pt will be transported out via wheelchair to significant other's vehicle. Will continue to monitor

## 2017-04-02 NOTE — Progress Notes (Signed)
Physical Therapy Treatment Patient Details Name: Cody Floyd MRN: [id number] DOB: Sep 09, 1940 Today's Date: 04/02/2017    History of Present Illness Pt is a 76 y/o male s/p elective L TKA. PMH includes arthritis, HTN, vertigo, and back surgery.     PT Comments     Pt performed well during pm tx and progressing well toward goals. Pt able to increase ambulation distance but still required cueing for gait sequencing as well as maintaining upright posture. Pt had one instance of L knee buckling during last 20' of ambulation. Pt able to increase exercise tolerance but requires cueing for proper technique and form. HHPT remains appropriate to assist in increasing LLE strength and overall functional mobility endurance.   Follow Up Recommendations  DC plan and follow up therapy as arranged by surgeon;Supervision/Assistance - 24 hour     Equipment Recommendations  None recommended by PT    Recommendations for Other Services       Precautions / Restrictions Precautions Precautions: Knee Precaution Booklet Issued: Yes (comment) Precaution Comments: Reviewed bone foam.  Restrictions Weight Bearing Restrictions: Yes LLE Weight Bearing: Weight bearing as tolerated    Mobility  Bed Mobility               General bed mobility comments: Pt in chair upon arrival.   Transfers Overall transfer level: Needs assistance Equipment used: Rolling walker (2 wheeled) Transfers: Sit to/from Stand Sit to Stand: Supervision         General transfer comment: Increased time. No physical assist but close guard for safety.  Ambulation/Gait   Ambulation Distance (Feet): 100 Feet Assistive device: Rolling walker (2 wheeled) Gait Pattern/deviations: Step-through pattern;Decreased step length - right;Decreased stance time - left;Decreased stride length;Decreased dorsiflexion - left;Decreased weight shift to left;Trunk flexed Gait velocity: dec   General Gait Details: Pt presents with decreased  step length on RLE and weight shift on LLE. Pt required cues for upright posture.    Stairs            Wheelchair Mobility    Modified Rankin (Stroke Patients Only)       Balance Overall balance assessment: Needs assistance Sitting-balance support: Feet supported Sitting balance-Leahy Scale: Good     Standing balance support: Bilateral upper extremity supported Standing balance-Leahy Scale: Poor Standing balance comment: Relient on RW for support.                             Cognition Arousal/Alertness: Awake/alert Behavior During Therapy: WFL for tasks assessed/performed Overall Cognitive Status: Within Functional Limits for tasks assessed                                 General Comments: Pt ready and eager to perform pm PT.       Exercises Total Joint Exercises Quad Sets: AROM;Left;10 reps;Seated Heel Slides: AROM;Left;10 reps;Seated Straight Leg Raises: AAROM;Left;10 reps;Seated Long Arc Quad: AROM;Left;10 reps;Seated    General Comments General comments (skin integrity, edema, etc.): Medial aspect of L knee slightly swollen.       Pertinent Vitals/Pain Pain Assessment: 0-10 Pain Score: 5  Pain Location: L knee with mobility Pain Descriptors / Indicators: Aching;Grimacing;Guarding Pain Intervention(s): Monitored during session    Home Living Family/patient expects to be discharged to:: Private residence Living Arrangements: Spouse/significant other  Prior Function            PT Goals (current goals can now be found in the care plan section) Acute Rehab PT Goals Patient Stated Goal: go home PT Goal Formulation: With patient/family Potential to Achieve Goals: Good Progress towards PT goals: Progressing toward goals    Frequency    7X/week      PT Plan Current plan remains appropriate    Co-evaluation              AM-PAC PT "6 Clicks" Daily Activity  Outcome Measure  Difficulty  turning over in bed (including adjusting bedclothes, sheets and blankets)?: A Little Difficulty moving from lying on back to sitting on the side of the bed? : A Little Difficulty sitting down on and standing up from a chair with arms (e.g., wheelchair, bedside commode, etc,.)?: A Little Help needed moving to and from a bed to chair (including a wheelchair)?: A Little Help needed walking in hospital room?: A Little Help needed climbing 3-5 steps with a railing? : A Little 6 Click Score: 18    End of Session Equipment Utilized During Treatment: Gait belt Activity Tolerance: Patient tolerated treatment well Patient left: in chair;with call bell/phone within reach;with family/visitor present Nurse Communication: Mobility status PT Visit Diagnosis: Difficulty in walking, not elsewhere classified (R26.2) Pain - Right/Left: Left Pain - part of body: Knee     Time: 1249-1316 PT Time Calculation (min) (ACUTE ONLY): 27 min  Charges:  $Gait Training: 8-22 mins $Therapeutic Exercise: 8-22 mins                    G CodesRandalyn Floyd, Cody Floyd 04/02/2017, 4:01 PM

## 2017-04-02 NOTE — Progress Notes (Signed)
   Subjective:  Patient reports pain as mild.  C/o leg heaviness  Objective:   VITALS:   Vitals:   03/31/17 2302 04/01/17 0650 04/01/17 1402 04/01/17 2208  BP: (!) 135/59 (!) 138/56 (!) 123/53 (!) 140/54  Pulse: 77 65 76 74  Resp: 17 17 16 19   Temp: (!) 100.5 F (38.1 C) 98.9 F (37.2 C) 97.8 F (36.6 C) 100.2 F (37.9 C)  TempSrc: Oral Oral Oral Oral  SpO2: 97% 96% 97% 94%  Weight:      Height:        Neurologically intact Neurovascular intact Sensation intact distally Intact pulses distally Dorsiflexion/Plantar flexion intact Incision: dressing C/D/I and no drainage No cellulitis present Compartment soft   Lab Results  Component Value Date   WBC 7.0 03/20/2017   HGB 14.9 03/20/2017   HCT 43.8 03/20/2017   MCV 84.6 03/20/2017   PLT 148 (L) 03/20/2017     Assessment/Plan:  2 Days Post-Op   - Expected postop acute blood loss anemia - will monitor for symptoms - Up with PT/OT - DVT ppx - SCDs, ambulation, xarelto - WBAT operative extremity - Pain control - Discharge planning - may dc home today if he does ok with PT  Eduard Roux 04/02/2017, 7:29 AM [phone]

## 2017-04-03 ENCOUNTER — Telehealth (INDEPENDENT_AMBULATORY_CARE_PROVIDER_SITE_OTHER): Payer: Self-pay | Admitting: Orthopedic Surgery

## 2017-04-03 DIAGNOSIS — Z471 Aftercare following joint replacement surgery: Secondary | ICD-10-CM | POA: Diagnosis not present

## 2017-04-03 DIAGNOSIS — Z9181 History of falling: Secondary | ICD-10-CM | POA: Diagnosis not present

## 2017-04-03 DIAGNOSIS — Z96652 Presence of left artificial knee joint: Secondary | ICD-10-CM | POA: Diagnosis not present

## 2017-04-03 NOTE — Telephone Encounter (Signed)
Patient's  Friend (Diane) called asked if there is another medication other than Xarellto patient can take. She advised the [facility] co-pay was $100.00. The number to contact Diane is [phone]

## 2017-04-06 DIAGNOSIS — Z471 Aftercare following joint replacement surgery: Secondary | ICD-10-CM | POA: Diagnosis not present

## 2017-04-06 DIAGNOSIS — Z9181 History of falling: Secondary | ICD-10-CM | POA: Diagnosis not present

## 2017-04-06 DIAGNOSIS — Z96652 Presence of left artificial knee joint: Secondary | ICD-10-CM | POA: Diagnosis not present

## 2017-04-06 NOTE — Telephone Encounter (Signed)
Can you advise on this one?  Dean patient, thanks.

## 2017-04-06 NOTE — Telephone Encounter (Signed)
S/w Diane, she gave phone to pt and he gave me the ok to speak with her, I asked pt for last 4 digit of his ss once that was done, I spoke with her. She is aware to jsut take 325mg  of ASA BID. States that is a lot better then the xarleto and will begin to give to him.

## 2017-04-06 NOTE — Telephone Encounter (Signed)
IC and got Diane, pt's friend, and she is not with patient. She is not on patient's release of information form.  I cannot s/w her without his permission.    She will call back once she is with him and ask for triage.  She can be advised this info once he gives verbal ok for [place] to s/w Diane.

## 2017-04-06 NOTE — Telephone Encounter (Signed)
At this point he will be 2 weeks tomorrow out from surgery. He can be put on just 325 mg aspirin twice daily until his follow-up with Dr. Marlou Sa.

## 2017-04-08 DIAGNOSIS — Z96652 Presence of left artificial knee joint: Secondary | ICD-10-CM | POA: Diagnosis not present

## 2017-04-08 DIAGNOSIS — Z9181 History of falling: Secondary | ICD-10-CM | POA: Diagnosis not present

## 2017-04-08 DIAGNOSIS — Z471 Aftercare following joint replacement surgery: Secondary | ICD-10-CM | POA: Diagnosis not present

## 2017-04-10 DIAGNOSIS — Z96652 Presence of left artificial knee joint: Secondary | ICD-10-CM | POA: Diagnosis not present

## 2017-04-10 DIAGNOSIS — Z9181 History of falling: Secondary | ICD-10-CM | POA: Diagnosis not present

## 2017-04-10 DIAGNOSIS — Z471 Aftercare following joint replacement surgery: Secondary | ICD-10-CM | POA: Diagnosis not present

## 2017-04-13 DIAGNOSIS — Z9181 History of falling: Secondary | ICD-10-CM | POA: Diagnosis not present

## 2017-04-13 DIAGNOSIS — Z471 Aftercare following joint replacement surgery: Secondary | ICD-10-CM | POA: Diagnosis not present

## 2017-04-13 DIAGNOSIS — Z96652 Presence of left artificial knee joint: Secondary | ICD-10-CM | POA: Diagnosis not present

## 2017-04-15 ENCOUNTER — Ambulatory Visit (INDEPENDENT_AMBULATORY_CARE_PROVIDER_SITE_OTHER): Payer: Medicare Other

## 2017-04-15 ENCOUNTER — Ambulatory Visit (INDEPENDENT_AMBULATORY_CARE_PROVIDER_SITE_OTHER): Payer: Medicare Other | Admitting: Orthopedic Surgery

## 2017-04-15 ENCOUNTER — Encounter (INDEPENDENT_AMBULATORY_CARE_PROVIDER_SITE_OTHER): Payer: Self-pay | Admitting: Orthopedic Surgery

## 2017-04-15 DIAGNOSIS — Z96652 Presence of left artificial knee joint: Secondary | ICD-10-CM

## 2017-04-15 DIAGNOSIS — Z471 Aftercare following joint replacement surgery: Secondary | ICD-10-CM | POA: Diagnosis not present

## 2017-04-15 DIAGNOSIS — Z9181 History of falling: Secondary | ICD-10-CM | POA: Diagnosis not present

## 2017-04-15 MED ORDER — METHOCARBAMOL 500 MG PO TABS: 500.0000 mg | ORAL_TABLET | Freq: Four times a day (QID) | ORAL | 0 refills | Status: DC | PRN

## 2017-04-15 MED ORDER — OXYCODONE HCL 5 MG PO TABS: 5.0000 mg | ORAL_TABLET | Freq: Four times a day (QID) | ORAL | 0 refills | Status: DC | PRN

## 2017-04-15 NOTE — Progress Notes (Signed)
   Post-Op Visit Note   Patient: Cody Floyd           Date of Birth: 09-02-1940           MRN: [id number] Visit Date: 04/15/2017 PCP: Jani Gravel, MD   Assessment & Plan:  Chief Complaint:  Chief Complaint  Patient presents with  . Post-op Follow-up    Lt TKA on 03/31/17   Visit Diagnoses:  1. Presence of left artificial knee joint     Plan: Cody Floyd is a patient who is now 2 weeks out left total knee replacement.  He's doing well.  He's having some postsurgical pain but his preoperative pain has improved.  Using oxycodone and muscle relaxer.  He is at 110 on the CPM.  On exam he has well-healed surgical incision and flexion: To 90 easily.  Radiographs look good.  Plan is for him week return with continuing physical therapy as an inpatient and outpatient in the interim.  Follow-Up Instructions: Return in about 4 weeks (around 05/13/2017).   Orders:  Orders Placed This Encounter  Procedures  . XR Knee 1-2 Views Left   No orders of the defined types were placed in this encounter.   Imaging: Xr Knee 1-2 Views Left  Result Date: 04/15/2017 AP lateral left knee reviewed.  Press-fit total knee prosthesis in good position and alignment.  No complicating features.   PMFS History: Patient Active Problem List   Diagnosis Date Noted  . Arthritis of knee 03/31/2017  . Spinal stenosis, lumbar region, with neurogenic claudication 05/29/2014    Class: Chronic  . Left lumbar radiculopathy 05/29/2014    Class: Chronic   Past Medical History:  Diagnosis Date  . Arthritis    "a little all over my body" (04/02/2017)  . GERD (gastroesophageal reflux disease)   . History of BPH   . Hyperlipidemia   . Hypertension   . RLS (restless legs syndrome)   . Ventral hernia   . Vertigo     Family History  Problem Relation Age of Onset  . Osteoarthritis Mother     Past Surgical History:  Procedure Laterality Date  . BACK SURGERY    . HAMMER TOE SURGERY Left 2012  . JOINT REPLACEMENT      . LUMBAR DISC SURGERY     "I had 2 other lower back ORs before the one in 2015"  . LUMBAR LAMINECTOMY/DECOMPRESSION MICRODISCECTOMY N/A 05/29/2014   Procedure: CENTRAL LUMBAR LAMINECTOMY L3-4 and L4-5;  Surgeon: Jessy Oto, MD;  Location: [facility];  Service: Orthopedics;  Laterality: N/A;  . TOTAL KNEE ARTHROPLASTY Left 03/31/2017   Procedure: LEFT TOTAL KNEE ARTHROPLASTY;  Surgeon: Meredith Pel, MD;  Location: [place];  Service: Orthopedics;  Laterality: Left;   Social History   Occupational History  .      retired from [employer], Clinical biochemist   Social History Main Topics  . Smoking status: Never Smoker  . Smokeless tobacco: Never Used  . Alcohol use Yes     Comment: 04/02/2017 "probably have 3 beers/month"  . Drug use: No  . Sexual activity: No

## 2017-04-15 NOTE — Discharge Summary (Signed)
Physician Discharge Summary  Patient ID: Cody Floyd MRN: [id number] DOB/AGE: 1941-01-23 76 y.o.  Admit date: 03/31/2017 Discharge date: 04/02/2017 Admission Diagnoses:  Active Problems:   Arthritis of knee   Discharge Diagnoses:  Same  Surgeries: Procedure(s): LEFT TOTAL KNEE ARTHROPLASTY on 03/31/2017   Consultants:   Discharged Condition: Stable  Hospital Course: Cody Floyd is an 76 y.o. male who was admitted 03/31/2017 with a chief complaint of left knee pain, and found to have a diagnosis of left knee arthritis.  They were brought to the operating room on 03/31/2017 and underwent the above named procedures.  Patient tolerated the procedure well without immediate complication and transferred to the recovery room in stable condition.  He was mobilized with physical therapy and use the CPM machine postoperatively.  Incision intact at the time of discharge.  Patient will follow up with me in 10 days for clinical evaluation.  Discharge home on DVT prophylaxis and pain medicine and muscle relaxers.  Antibiotics given:  Anti-infectives    Start     Dose/Rate Route Frequency Ordered Stop   03/31/17 1400  ceFAZolin (ANCEF) IVPB 2g/100 mL premix     2 g 200 mL/hr over 30 Minutes Intravenous Every 6 hours 03/31/17 1431 04/01/17 0159   03/31/17 0553  ceFAZolin (ANCEF) 2-4 GM/100ML-% IVPB    Comments:  Starleen Arms   : cabinet override      03/31/17 0553 03/31/17 0750   03/31/17 0548  ceFAZolin (ANCEF) IVPB 2g/100 mL premix     2 g 200 mL/hr over 30 Minutes Intravenous On call to O.R. 03/31/17 8416 03/31/17 0755    .  Recent vital signs:  Vitals:   04/01/17 2208 04/02/17 1500  BP:  (!) 114/56  Pulse:  69  Resp: 19   Temp:  99.3 F (37.4 C)  SpO2:  98%    Recent laboratory studies:  Results for orders placed or performed during the hospital encounter of 03/20/17  Surgical pcr screen  Result Value Ref Range   MRSA, PCR NEGATIVE NEGATIVE   Staphylococcus aureus NEGATIVE  NEGATIVE  Urine culture  Result Value Ref Range   Specimen Description URINE, CLEAN CATCH    Special Requests NONE    Culture NO GROWTH    Report Status 03/21/2017 FINAL   Basic metabolic panel  Result Value Ref Range   Sodium 139 135 - 145 mmol/L   Potassium 4.4 3.5 - 5.1 mmol/L   Chloride 109 101 - 111 mmol/L   CO2 23 22 - 32 mmol/L   Glucose, Bld 103 (H) 65 - 99 mg/dL   BUN 26 (H) 6 - 20 mg/dL   Creatinine, Ser 1.39 (H) 0.61 - 1.24 mg/dL   Calcium 9.7 8.9 - 10.3 mg/dL   GFR calc non Af Amer 48 (L) >60 mL/min   GFR calc Af Amer 55 (L) >60 mL/min   Anion gap 7 5 - 15  CBC  Result Value Ref Range   WBC 7.0 4.0 - 10.5 K/uL   RBC 5.18 4.22 - 5.81 MIL/uL   Hemoglobin 14.9 13.0 - 17.0 g/dL   HCT 43.8 39.0 - 52.0 %   MCV 84.6 78.0 - 100.0 fL   MCH 28.8 26.0 - 34.0 pg   MCHC 34.0 30.0 - 36.0 g/dL   RDW 15.1 11.5 - 15.5 %   Platelets 148 (L) 150 - 400 K/uL  Urinalysis, Routine w reflex microscopic  Result Value Ref Range   Color, Urine YELLOW YELLOW  APPearance CLEAR CLEAR   Specific Gravity, Urine 1.025 1.005 - 1.030   pH 5.0 5.0 - 8.0   Glucose, UA NEGATIVE NEGATIVE mg/dL   Hgb urine dipstick NEGATIVE NEGATIVE   Bilirubin Urine NEGATIVE NEGATIVE   Ketones, ur NEGATIVE NEGATIVE mg/dL   Protein, ur NEGATIVE NEGATIVE mg/dL   Nitrite NEGATIVE NEGATIVE   Leukocytes, UA NEGATIVE NEGATIVE    Discharge Medications:   Allergies as of 04/02/2017      Reactions   No Known Allergies       Medication List    STOP taking these medications   aspirin 325 MG tablet     TAKE these medications   latanoprost 0.005 % ophthalmic solution Commonly known as:  XALATAN INSTILL 1 DROP INTO BOTH EYES NIGHTLY   lisinopril 10 MG tablet Commonly known as:  PRINIVIL,ZESTRIL Take 10 mg by mouth daily.   meclizine 25 MG tablet Commonly known as:  ANTIVERT Take 25 mg by mouth 3 (three) times daily as needed for dizziness.   methocarbamol 500 MG tablet Commonly known as:  ROBAXIN Take  1 tablet (500 mg total) by mouth every 6 (six) hours as needed for muscle spasms.   omeprazole 20 MG tablet Commonly known as:  PRILOSEC OTC Take 20 mg by mouth daily.   oxyCODONE 5 MG immediate release tablet Commonly known as:  Oxy IR/ROXICODONE Take 1-2 tablets (5-10 mg total) by mouth every 3 (three) hours as needed for moderate pain or severe pain.   propranolol 120 MG 24 hr capsule Commonly known as:  INNOPRAN XL Take 120 mg by mouth daily as needed (for BP and migraine).       Diagnostic Studies: Dg Chest 2 View  Result Date: 03/20/2017 CLINICAL DATA:  Preoperative respiratory evaluation prior to left total knee arthroplasty. Current history of hypertension. EXAM: CHEST  2 VIEW COMPARISON:  05/24/2014, 07/22/2010 and earlier. FINDINGS: Cardiac silhouette normal in size, unchanged. Thoracic aorta mildly tortuous, unchanged. Hilar and mediastinal contours otherwise unremarkable. Lungs clear. Bronchovascular markings normal. Pulmonary vascularity normal. No visible pleural effusions. No pneumothorax. Degenerative changes involving the thoracic spine. IMPRESSION: No acute cardiopulmonary disease.  Stable examination. Electronically Signed   By: Evangeline Dakin M.D.   On: 03/20/2017 09:56    Disposition: 01-Home or Self Care  Discharge Instructions    Call MD / Call 911    Complete by:  As directed    If you experience chest pain or shortness of breath, CALL 911 and be transported to the hospital emergency room.  If you develope a fever above 101 F, pus (white drainage) or increased drainage or redness at the wound, or calf pain, call your surgeon's office.   Call MD / Call 911    Complete by:  As directed    If you experience chest pain or shortness of breath, CALL 911 and be transported to the hospital emergency room.  If you develope a fever above 101.5 F, pus (white drainage) or increased drainage or redness at the wound, or calf pain, call your surgeon's office.   Constipation  Prevention    Complete by:  As directed    Drink plenty of fluids.  Prune juice may be helpful.  You may use a stool softener, such as Colace (over the counter) 100 mg twice a day.  Use MiraLax (over the counter) for constipation as needed.   Constipation Prevention    Complete by:  As directed    Drink plenty of fluids.  Prune juice  may be helpful.  You may use a stool softener, such as Colace (over the counter) 100 mg twice a day.  Use MiraLax (over the counter) for constipation as needed.   Diet - low sodium heart healthy    Complete by:  As directed    Discharge instructions    Complete by:  As directed    Weightbearing as tolerated left lower extremity CPM machine 1 hour 3 times a day minimum Okay to shower dressing is waterproof   Driving restrictions    Complete by:  As directed    No driving while taking narcotic pain meds.   Increase activity slowly as tolerated    Complete by:  As directed    Increase activity slowly as tolerated    Complete by:  As directed       Follow-up Information    [facility] Follow up.   Specialty:  [place] Why:  A representative from [facility] will contact you to arrange start date and time for start of your therapy. Contact information: [address] [place] [address] [phone]            Signed: Anderson Malta 04/15/2017, 8:09 AM

## 2017-04-17 DIAGNOSIS — Z471 Aftercare following joint replacement surgery: Secondary | ICD-10-CM | POA: Diagnosis not present

## 2017-04-17 DIAGNOSIS — Z96652 Presence of left artificial knee joint: Secondary | ICD-10-CM | POA: Diagnosis not present

## 2017-04-17 DIAGNOSIS — Z9181 History of falling: Secondary | ICD-10-CM | POA: Diagnosis not present

## 2017-04-21 DIAGNOSIS — H40053 Ocular hypertension, bilateral: Secondary | ICD-10-CM | POA: Diagnosis not present

## 2017-04-21 DIAGNOSIS — H402222 Chronic angle-closure glaucoma, left eye, moderate stage: Secondary | ICD-10-CM | POA: Diagnosis not present

## 2017-04-21 DIAGNOSIS — H402211 Chronic angle-closure glaucoma, right eye, mild stage: Secondary | ICD-10-CM | POA: Diagnosis not present

## 2017-04-22 DIAGNOSIS — M25562 Pain in left knee: Secondary | ICD-10-CM | POA: Diagnosis not present

## 2017-04-24 DIAGNOSIS — M25562 Pain in left knee: Secondary | ICD-10-CM | POA: Diagnosis not present

## 2017-04-28 DIAGNOSIS — M25562 Pain in left knee: Secondary | ICD-10-CM | POA: Diagnosis not present

## 2017-04-30 ENCOUNTER — Telehealth (INDEPENDENT_AMBULATORY_CARE_PROVIDER_SITE_OTHER): Payer: Self-pay | Admitting: *Deleted

## 2017-04-30 DIAGNOSIS — M25562 Pain in left knee: Secondary | ICD-10-CM | POA: Diagnosis not present

## 2017-04-30 MED ORDER — OXYCODONE HCL 5 MG PO TABS: 5.0000 mg | ORAL_TABLET | Freq: Four times a day (QID) | ORAL | 0 refills | Status: DC | PRN

## 2017-04-30 MED ORDER — METHOCARBAMOL 500 MG PO TABS: 500.0000 mg | ORAL_TABLET | Freq: Four times a day (QID) | ORAL | 0 refills | Status: DC | PRN

## 2017-04-30 NOTE — Telephone Encounter (Signed)
Rx request, Cody Floyd pt

## 2017-04-30 NOTE — Telephone Encounter (Signed)
Patient walked in requesting a medication to be refilled, patient states he is unsure the name of them, per pt it is a pain pill and a muscle relaxer. Please advise [phone]

## 2017-04-30 NOTE — Telephone Encounter (Signed)
Ok to rf pain meds and muscle relaxer?

## 2017-04-30 NOTE — Telephone Encounter (Signed)
IC patient advised could pick up at front desk and take to pharmacy

## 2017-04-30 NOTE — Telephone Encounter (Signed)
y

## 2017-05-11 DIAGNOSIS — G629 Polyneuropathy, unspecified: Secondary | ICD-10-CM | POA: Diagnosis not present

## 2017-05-11 DIAGNOSIS — K219 Gastro-esophageal reflux disease without esophagitis: Secondary | ICD-10-CM | POA: Diagnosis not present

## 2017-05-11 DIAGNOSIS — G2581 Restless legs syndrome: Secondary | ICD-10-CM | POA: Diagnosis not present

## 2017-05-11 DIAGNOSIS — N400 Enlarged prostate without lower urinary tract symptoms: Secondary | ICD-10-CM | POA: Diagnosis not present

## 2017-05-11 DIAGNOSIS — I10 Essential (primary) hypertension: Secondary | ICD-10-CM | POA: Diagnosis not present

## 2017-05-11 DIAGNOSIS — Z125 Encounter for screening for malignant neoplasm of prostate: Secondary | ICD-10-CM | POA: Diagnosis not present

## 2017-05-11 DIAGNOSIS — R739 Hyperglycemia, unspecified: Secondary | ICD-10-CM | POA: Diagnosis not present

## 2017-05-15 ENCOUNTER — Ambulatory Visit (INDEPENDENT_AMBULATORY_CARE_PROVIDER_SITE_OTHER): Payer: Medicare Other | Admitting: Orthopedic Surgery

## 2017-05-20 DIAGNOSIS — M5431 Sciatica, right side: Secondary | ICD-10-CM | POA: Diagnosis not present

## 2017-05-20 DIAGNOSIS — R42 Dizziness and giddiness: Secondary | ICD-10-CM | POA: Diagnosis not present

## 2017-05-20 DIAGNOSIS — Z0000 Encounter for general adult medical examination without abnormal findings: Secondary | ICD-10-CM | POA: Diagnosis not present

## 2017-05-20 DIAGNOSIS — M5432 Sciatica, left side: Secondary | ICD-10-CM | POA: Diagnosis not present

## 2017-05-20 DIAGNOSIS — Z23 Encounter for immunization: Secondary | ICD-10-CM | POA: Diagnosis not present

## 2017-06-09 DIAGNOSIS — L309 Dermatitis, unspecified: Secondary | ICD-10-CM | POA: Diagnosis not present

## 2017-06-26 DIAGNOSIS — M4310 Spondylolisthesis, site unspecified: Secondary | ICD-10-CM | POA: Diagnosis not present

## 2017-06-30 ENCOUNTER — Other Ambulatory Visit: Payer: Self-pay | Admitting: Neurosurgery

## 2017-06-30 DIAGNOSIS — M4310 Spondylolisthesis, site unspecified: Secondary | ICD-10-CM

## 2017-07-13 ENCOUNTER — Ambulatory Visit
Admission: RE | Admit: 2017-07-13 | Discharge: 2017-07-13 | Disposition: A | Discharge status: Home / Self Care | Payer: Medicare Other | Source: Ambulatory Visit | Attending: Neurosurgery | Admitting: Neurosurgery

## 2017-07-13 VITALS — BP 159/60 | HR 64

## 2017-07-13 DIAGNOSIS — M4310 Spondylolisthesis, site unspecified: Secondary | ICD-10-CM

## 2017-07-13 DIAGNOSIS — M5416 Radiculopathy, lumbar region: Secondary | ICD-10-CM

## 2017-07-13 DIAGNOSIS — M47816 Spondylosis without myelopathy or radiculopathy, lumbar region: Secondary | ICD-10-CM | POA: Diagnosis not present

## 2017-07-13 DIAGNOSIS — M48062 Spinal stenosis, lumbar region with neurogenic claudication: Secondary | ICD-10-CM

## 2017-07-13 DIAGNOSIS — M5126 Other intervertebral disc displacement, lumbar region: Secondary | ICD-10-CM | POA: Diagnosis not present

## 2017-07-13 MED ORDER — DIAZEPAM 5 MG PO TABS
5.0000 mg | ORAL_TABLET | Freq: Once | ORAL | Status: AC
  Administered 2017-07-13: 5 mg via ORAL

## 2017-07-13 MED ORDER — ONDANSETRON HCL 4 MG/2ML IJ SOLN: 4.0000 mg | Freq: Four times a day (QID) | INTRAMUSCULAR | Status: DC | PRN

## 2017-07-13 MED ORDER — IOPAMIDOL (ISOVUE-M 200) INJECTION 41%
20.0000 mL | Freq: Once | INTRAMUSCULAR | Status: AC
  Administered 2017-07-13: 20 mL via INTRATHECAL

## 2017-07-13 NOTE — Discharge Instructions (Signed)

## 2017-07-22 DIAGNOSIS — I10 Essential (primary) hypertension: Secondary | ICD-10-CM | POA: Diagnosis not present

## 2017-07-22 DIAGNOSIS — M4310 Spondylolisthesis, site unspecified: Secondary | ICD-10-CM | POA: Diagnosis not present

## 2017-08-10 DIAGNOSIS — M9983 Other biomechanical lesions of lumbar region: Secondary | ICD-10-CM | POA: Diagnosis not present

## 2017-08-10 DIAGNOSIS — M961 Postlaminectomy syndrome, not elsewhere classified: Secondary | ICD-10-CM | POA: Diagnosis not present

## 2017-08-10 DIAGNOSIS — M47816 Spondylosis without myelopathy or radiculopathy, lumbar region: Secondary | ICD-10-CM | POA: Diagnosis not present

## 2017-09-02 DIAGNOSIS — M9983 Other biomechanical lesions of lumbar region: Secondary | ICD-10-CM | POA: Diagnosis not present

## 2017-09-02 DIAGNOSIS — M48061 Spinal stenosis, lumbar region without neurogenic claudication: Secondary | ICD-10-CM | POA: Diagnosis not present

## 2017-09-02 DIAGNOSIS — I10 Essential (primary) hypertension: Secondary | ICD-10-CM | POA: Diagnosis not present

## 2017-10-28 ENCOUNTER — Encounter (INDEPENDENT_AMBULATORY_CARE_PROVIDER_SITE_OTHER): Payer: Self-pay | Admitting: Orthopedic Surgery

## 2017-10-28 ENCOUNTER — Ambulatory Visit (INDEPENDENT_AMBULATORY_CARE_PROVIDER_SITE_OTHER): Payer: Medicare Other | Admitting: Orthopedic Surgery

## 2017-10-28 ENCOUNTER — Ambulatory Visit (INDEPENDENT_AMBULATORY_CARE_PROVIDER_SITE_OTHER): Payer: Medicare Other

## 2017-10-28 DIAGNOSIS — G8929 Other chronic pain: Secondary | ICD-10-CM

## 2017-10-28 DIAGNOSIS — M25561 Pain in right knee: Secondary | ICD-10-CM

## 2017-10-28 DIAGNOSIS — M1711 Unilateral primary osteoarthritis, right knee: Secondary | ICD-10-CM

## 2017-10-28 MED ORDER — BUPIVACAINE HCL 0.25 % IJ SOLN
4.0000 mL | INTRAMUSCULAR | Status: AC | PRN
  Administered 2017-10-28: 4 mL via INTRA_ARTICULAR

## 2017-10-28 MED ORDER — LIDOCAINE HCL 1 % IJ SOLN
5.0000 mL | INTRAMUSCULAR | Status: AC | PRN
  Administered 2017-10-28: 5 mL

## 2017-10-28 MED ORDER — METHYLPREDNISOLONE ACETATE 40 MG/ML IJ SUSP
40.0000 mg | INTRAMUSCULAR | Status: AC | PRN
  Administered 2017-10-28: 40 mg via INTRA_ARTICULAR

## 2017-10-28 NOTE — Progress Notes (Addendum)
Office Visit Note   Patient: Cody Floyd           Date of Birth: 1941-06-11           MRN: [id number] Visit Date: 10/28/2017 Requested by: Jani Gravel, MD [address] [address] [place], [place] [address] PCP: Jani Gravel, MD  Subjective: Chief Complaint  Patient presents with  . Right Knee - Pain    HPI: Cody Floyd is a patient with right knee pain.  He is doing well from his left total knee replacement in August.  He reports pain primarily at night.  He does not have much pain when he walks around.  Taking some occasional over-the-counter medication for the problem.  He does home projects.  Denies any mechanical symptoms or history of injury to the right knee recently.              ROS: All systems reviewed are negative as they relate to the chief complaint within the history of present illness.  Patient denies  fevers or chills.   Assessment & Plan: Visit Diagnoses:  1. Chronic pain of right knee   2. Unilateral primary osteoarthritis, right knee     Plan: Impression is symptomatic right knee arthritis which is mostly symptomatic at night.  He is able to walk around without much restriction.  Plan at this time is to try a cortisone injection to see if that can give him a little relief.  Check back in 6 weeks to decide for or against further surgical intervention.  Continue with nonweightbearing quad strengthening exercises.  Follow-Up Instructions: Return in about 6 weeks (around 12/09/2017).   Orders:  Orders Placed This Encounter  Procedures  . XR KNEE 3 VIEW RIGHT   No orders of the defined types were placed in this encounter.     Procedures: Large Joint Inj: R knee on 10/28/2017 9:48 AM Indications: diagnostic evaluation, joint swelling and pain Details: 18 G 1.5 in needle, superolateral approach  Arthrogram: No  Medications: 5 mL lidocaine 1 %; 40 mg methylPREDNISolone acetate 40 MG/ML; 4 mL bupivacaine 0.25 % Outcome: tolerated well, no immediate  complications Procedure, treatment alternatives, risks and benefits explained, specific risks discussed. Consent was given by the patient. Immediately prior to procedure a time out was called to verify the correct patient, procedure, equipment, support staff and site/side marked as required. Patient was prepped and draped in the usual sterile fashion.       Clinical Data: No additional findings.  Objective: Vital Signs: There were no vitals taken for this visit.  Physical Exam:   Constitutional: Patient appears well-developed HEENT:  Head: Normocephalic Eyes:EOM are normal Neck: Normal range of motion Cardiovascular: Normal rate Pulmonary/chest: Effort normal Neurologic: Patient is alert Skin: Skin is warm Psychiatric: Patient has normal mood and affect    Ortho Exam: Orthopedic exam demonstrates good range of motion of that right knee from 0-110 degrees of flexion.  Collateral and cruciate ligaments are stable.  No extensor mechanism tenderness.  No effusion.  Pedal pulses palpable.  No masses lymphadenopathy or skin changes noted in the right knee region  Specialty Comments:  No specialty comments available.  Imaging: Xr Knee 3 View Right  Result Date: 10/28/2017 AP lateral merchant right knee reviewed.  Medial compartment arthritis is significant.  There is some also significant patellofemoral changes and less significant lateral joint space changes.  No fracture dislocation or effusion is present.  Alignment is slight varus.    PMFS History: Patient  Active Problem List   Diagnosis Date Noted  . Arthritis of knee 03/31/2017  . Spinal stenosis, lumbar region, with neurogenic claudication 05/29/2014    Class: Chronic  . Left lumbar radiculopathy 05/29/2014    Class: Chronic   Past Medical History:  Diagnosis Date  . Arthritis    "a little all over my body" (04/02/2017)  . GERD (gastroesophageal reflux disease)   . History of BPH   . Hyperlipidemia   .  Hypertension   . RLS (restless legs syndrome)   . Ventral hernia   . Vertigo     Family History  Problem Relation Age of Onset  . Osteoarthritis Mother     Past Surgical History:  Procedure Laterality Date  . BACK SURGERY    . HAMMER TOE SURGERY Left 2012  . JOINT REPLACEMENT    . LUMBAR DISC SURGERY     "I had 2 other lower back ORs before the one in 2015"  . LUMBAR LAMINECTOMY/DECOMPRESSION MICRODISCECTOMY N/A 05/29/2014   Procedure: CENTRAL LUMBAR LAMINECTOMY L3-4 and L4-5;  Surgeon: Jessy Oto, MD;  Location: [facility];  Service: Orthopedics;  Laterality: N/A;  . TOTAL KNEE ARTHROPLASTY Left 03/31/2017   Procedure: LEFT TOTAL KNEE ARTHROPLASTY;  Surgeon: Meredith Pel, MD;  Location: 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 Smoking status: Never Smoker  . Smokeless tobacco: Never Used  Substance and Sexual Activity  . Alcohol use: Yes    Comment: 04/02/2017 "probably have 3 beers/month"  . Drug use: No  . Sexual activity: No

## 2017-11-19 DIAGNOSIS — M961 Postlaminectomy syndrome, not elsewhere classified: Secondary | ICD-10-CM | POA: Diagnosis not present

## 2017-11-19 DIAGNOSIS — M9983 Other biomechanical lesions of lumbar region: Secondary | ICD-10-CM | POA: Diagnosis not present

## 2017-11-19 DIAGNOSIS — M47816 Spondylosis without myelopathy or radiculopathy, lumbar region: Secondary | ICD-10-CM | POA: Diagnosis not present

## 2017-12-01 DIAGNOSIS — M48061 Spinal stenosis, lumbar region without neurogenic claudication: Secondary | ICD-10-CM | POA: Diagnosis not present

## 2017-12-10 IMAGING — XA Imaging study
2 series · 2 of 2 positions shown · non-contrast
Comparison: none

CLINICAL DATA: Lumbosacral spondylosis without myelopathy.
BILATERAL lower extremity radicular symptoms, worse on the LEFT.
Excellent improvement for several months after the previous
injection. I repeated it.

[Series 3: ortho standard · 1 of 1 slices shown (1 of 2)]
[im 1/1]
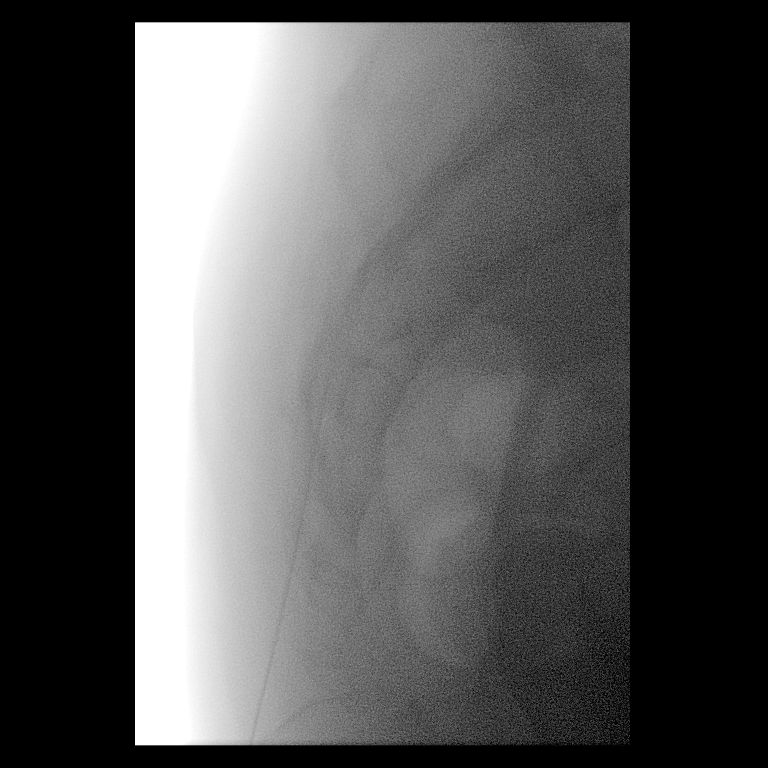

[Series 4: ortho standard · 1 of 1 slices shown (2 of 2)]
[im 1/1]
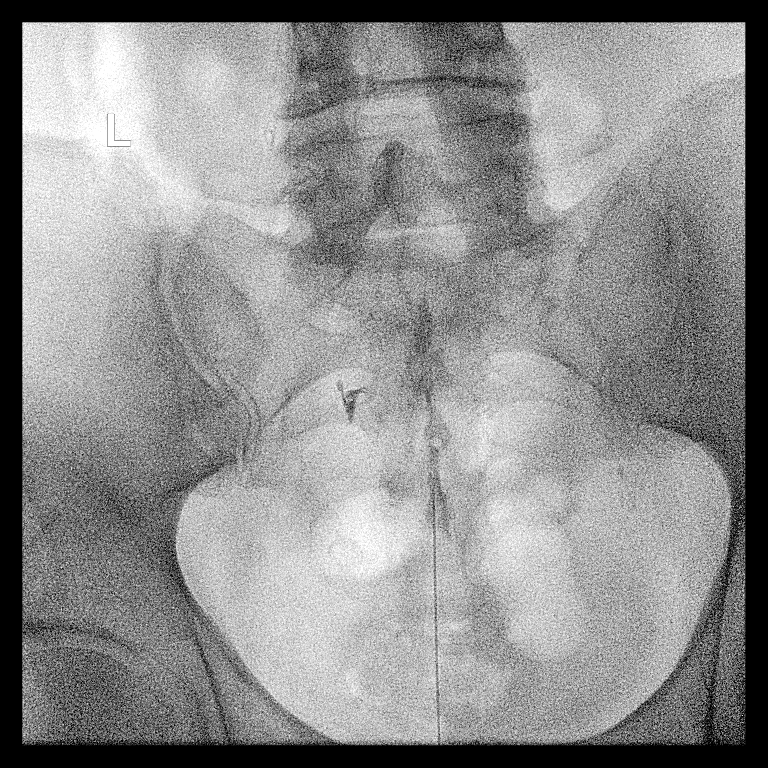

[2 of 2 positions shown; findings below may reference images not displayed]

FLUOROSCOPY TIME:  12 seconds corresponding to a Dose Area Product
of 58.16 ?Gy*m2

EXAM:
CAUDAL EPIDURAL INJECTION

Risks and benefits of procedure were discussed with the patient who
agreed proceed. Time-out was performed.

Utilizing a caudal approach, the skin overlying the sacral hiatus
was cleansed and anesthetized. A 22 gauge spinal needle was advanced
into the sacral epidural space. Injection of Isovue-M 200 shows a
good epidural pattern with spread up to L5-S1, greater on the LEFT.
No vascular opacification is seen.

120 mg of Depo-Medrol mixed with 3 ml of normal saline and 3 ml of
1% Lidocaine were instilled. The procedure was well-tolerated, and
the patient was discharged thirty minutes following the injection in
good condition.
IMPRESSION: Technically successful caudal epidural injection #2.

## 2018-03-30 DIAGNOSIS — M5440 Lumbago with sciatica, unspecified side: Secondary | ICD-10-CM | POA: Diagnosis not present

## 2018-03-30 DIAGNOSIS — R5383 Other fatigue: Secondary | ICD-10-CM | POA: Diagnosis not present

## 2018-03-30 DIAGNOSIS — R0602 Shortness of breath: Secondary | ICD-10-CM | POA: Diagnosis not present

## 2018-03-30 DIAGNOSIS — I10 Essential (primary) hypertension: Secondary | ICD-10-CM | POA: Diagnosis not present

## 2018-03-30 DIAGNOSIS — R42 Dizziness and giddiness: Secondary | ICD-10-CM | POA: Diagnosis not present

## 2018-03-30 DIAGNOSIS — R079 Chest pain, unspecified: Secondary | ICD-10-CM | POA: Diagnosis not present

## 2018-04-01 DIAGNOSIS — R079 Chest pain, unspecified: Secondary | ICD-10-CM | POA: Diagnosis not present

## 2018-04-01 DIAGNOSIS — I453 Trifascicular block: Secondary | ICD-10-CM | POA: Diagnosis not present

## 2018-04-01 DIAGNOSIS — R0609 Other forms of dyspnea: Secondary | ICD-10-CM | POA: Diagnosis not present

## 2018-04-01 DIAGNOSIS — I10 Essential (primary) hypertension: Secondary | ICD-10-CM | POA: Diagnosis not present

## 2018-04-02 DIAGNOSIS — R9431 Abnormal electrocardiogram [ECG] [EKG]: Secondary | ICD-10-CM | POA: Diagnosis not present

## 2018-04-02 DIAGNOSIS — I10 Essential (primary) hypertension: Secondary | ICD-10-CM | POA: Diagnosis not present

## 2018-04-02 DIAGNOSIS — R0789 Other chest pain: Secondary | ICD-10-CM | POA: Diagnosis not present

## 2018-04-02 DIAGNOSIS — R0602 Shortness of breath: Secondary | ICD-10-CM | POA: Diagnosis not present

## 2018-04-19 DIAGNOSIS — M549 Dorsalgia, unspecified: Secondary | ICD-10-CM | POA: Diagnosis not present

## 2018-04-19 DIAGNOSIS — M545 Low back pain: Secondary | ICD-10-CM | POA: Diagnosis not present

## 2018-05-13 DIAGNOSIS — R0989 Other specified symptoms and signs involving the circulatory and respiratory systems: Secondary | ICD-10-CM | POA: Diagnosis not present

## 2018-05-13 DIAGNOSIS — R0609 Other forms of dyspnea: Secondary | ICD-10-CM | POA: Diagnosis not present

## 2018-05-14 DIAGNOSIS — L814 Other melanin hyperpigmentation: Secondary | ICD-10-CM | POA: Diagnosis not present

## 2018-05-14 DIAGNOSIS — L819 Disorder of pigmentation, unspecified: Secondary | ICD-10-CM | POA: Diagnosis not present

## 2018-05-14 DIAGNOSIS — L821 Other seborrheic keratosis: Secondary | ICD-10-CM | POA: Diagnosis not present

## 2018-05-14 DIAGNOSIS — L570 Actinic keratosis: Secondary | ICD-10-CM | POA: Diagnosis not present

## 2018-05-14 DIAGNOSIS — D1801 Hemangioma of skin and subcutaneous tissue: Secondary | ICD-10-CM | POA: Diagnosis not present

## 2018-05-14 DIAGNOSIS — Z23 Encounter for immunization: Secondary | ICD-10-CM | POA: Diagnosis not present

## 2018-05-19 ENCOUNTER — Other Ambulatory Visit: Payer: Self-pay

## 2018-05-19 NOTE — Patient Outreach (Signed)
[facility]) Care Management  05/19/2018  Cody Floyd [birth year] [id number]   Medication Adherence call to Mr. Cody Floyd left a message for patient to call back patient is due on Lisinopril 10 mg. Cody Floyd is showing past due under [facility].  [facility] Management Direct Dial [phone]  Fax [phone] [email or web address]

## 2018-05-20 DIAGNOSIS — I10 Essential (primary) hypertension: Secondary | ICD-10-CM | POA: Diagnosis not present

## 2018-05-20 DIAGNOSIS — I209 Angina pectoris, unspecified: Secondary | ICD-10-CM | POA: Diagnosis not present

## 2018-05-20 DIAGNOSIS — I453 Trifascicular block: Secondary | ICD-10-CM | POA: Diagnosis not present

## 2018-05-20 DIAGNOSIS — E785 Hyperlipidemia, unspecified: Secondary | ICD-10-CM | POA: Diagnosis not present

## 2018-05-20 DIAGNOSIS — R0609 Other forms of dyspnea: Secondary | ICD-10-CM | POA: Diagnosis not present

## 2018-05-31 DIAGNOSIS — I209 Angina pectoris, unspecified: Secondary | ICD-10-CM | POA: Diagnosis present

## 2018-05-31 NOTE — H&P (Signed)
OFFICE VISIT NOTES COPIED TO EPIC FOR DOCUMENTATION  . History of Present Illness Cody Page MD; 05/30/2018 8:08 PM) Patient words: Last O/V 04/01/2018; F/U Echo, Carotid, Nuc results.  The patient is a 77 year old male who presents for a Follow-up for Chest pain.  Additional reasons for visit:  Follow-up for Shortness of breath is described as the following: Cody Floyd is a Caucasian male with history of lumbar radiculopathy, GERD, hypertension, migraines, and hyperlipidemia who presents with shortness of breath and chest pain. Symptoms have been worsening for the past 3-6 months. The shortness of breath occurs with exertion. He has to stop when walking to his mailbox (~50 ft) because he is out of breath. Resting for a few minutes will relieve symptoms. He denies shortness of breath at rest, edema, palpitations, claudication, or syncope. Symptoms have significantly reduced his activities of daily living. He is a non-smoker, has never smoked, and does not drink alcohol. He does not engage in regular exercise due to his shortness of breath.  He is here on a three-week visit following his exertional chest pain suggestive of angina pectoris and also worsening dyspnea. In spite of medical management, he states symptoms persist..  The chest pain occurs only after having intercourse. He describes it as a burning feeling in the center of his chest. It is non-radiating. It makes him feel like he needs to cough. It lasts only a few seconds and then spontaneously resolves. He denies chest pain with any other exertional activity.  Patient also complains of frequent loss of balance, especially when closing his eyes. He has frequent falls. He denies lightheadedness as the cause of being off balance. He has had a neurology referral made by his PCP but denies seeing a neurologist.   Problem List/Past Medical (April Garrison; 05/20/2018 10:23 AM) Lumbar radiculopathy (M54.16)  GERD (gastroesophageal  reflux disease) (K21.9)  Hyperlipemia (E78.5)  Laboratory examination (Z01.89)  03/30/2018: eGFR 74, Creatinine 1.1, Potassium 4.9, BMP normal. Platelets 138, CBC otherwise normal. TSH 2.5. Labs 03/31/19/19: HB 14.0/HCT 41.9, platelets 138,000. Vitamin B12 373. Labs 05/11/2017: CBC normal. Hepatic panel normal. Trifascicular block (I45.3)  EKG 04/01/2018: Sinus rhythm with first-degree AV block at the rate of 72 bpm, left axis deviation, left anterior fascicular block. Right under branch block. Right fascicular block. Dyspnea on exertion (R06.09)  Exertional chest pain (R07.9)  Lexiscan myoview stress test 04/02/2018: 1. Lexiscan stress test was performed. Exercise capacity was not assessed. Stress symptoms included nausea, dizziness. Resting blood pressure was 142/68 mmHg, peak effect blood pressure was 110/50 mmHg. The resting and stress electrocardiogram demonstrated normal sinus rhythm, RBBB + LAHB, no resting arrhythmias and normal rest repolarization. Stress EKG is non diagnostic for ischemia as it is a pharmacologic stress. 2. The overall quality of the study is good. Left ventricular cavity is noted to be normal on the rest and stress studies. Gated SPECT images reveal normal myocardial thickening and wall motion. The left ventricular ejection fraction was calculated or visually estimated to be 55%. SPECT images demonstrate medium sized perfusion abnormality of mild intensity in the basal inferior, mid inferoseptal, mid inferior, apical septal and apical inferior myocardial wall(s) on the stress images, with moderate reversibility on rest images. Findings suggest medium sized RCA territory infarct with moderate superimposed ischemia. 3. Intermediate risk study. Peripheral artery disease (I73.9)  Benign essential hypertension (I10)  Bilateral carotid bruits (R09.89)  Carotid artery duplex 05/13/2018: Stenosis in the right internal carotid artery (16-49%). Stenosis in the left internal carotid  artery (16-49%). Mild stenosis in the left external carotid artery (<50%). Antegrade right vertebral artery flow. Antegrade left vertebral artery flow. Follow up in one year is appropriate if clinically indicated.  Allergies (April Garrison; 2018/06/09 10:23 AM) No Known Drug Allergies [04/01/2018]:  Family History (April Louretta Shorten; 06/09/18 10:23 AM) Mother  Deceased. at age 83; No heart issues Father  Deceased. at age 50; No heart issues Brother 3  older; 1 passed in operating 2 living No heart issues  Social History (April Garrison; 06-09-18 10:23 AM) Current tobacco use  Never smoker. Non Drinker/No Alcohol Use  Marital status  In a relationship Living Situation  Lives with domestic partner. Number of Children  0.  Past Surgical History (April Garrison; 09-Jun-2018 10:23 AM) Back Surgery  Lumbar Laminectomy [2015]: Procedure: CENTRAL LUMBAR LAMINECTOMY L3-4 and L4-5; Surgeon: Jessy Oto, MD; Location: [place]; Service: Orthopedics; Laterality: N/A; Total knee Arthroplasty [03/31/2017]: Procedure: LEFT TOTAL KNEE ARTHROPLASTY; Surgeon: Meredith Pel, MD; Location: [place]; Service: Orthopedics; Laterality: Left;  Medication History Cody Page, MD; 05/30/2018 8:08 PM) amLODIPine Besylate (5MG Tablet, 1 (one) Tablet Oral daily, Taken starting 04/01/2018) Active. Propranolol HCl (120MG Capsule ER, 1 Oral daily as needed for migraine) Active. (Patient takes once a week to prevent migraine) Mirapex (0.5MG Tablet, 1 Oral at bedtime) Active: Restless leg. Lisinopril (10MG Tablet, 1 Oral daily) Active. Meclizine HCl (25MG Tablet, 1 Oral as needed) Active. Omeprazole (20MG Capsule DR, 1 Oral as needed) Active. Terazosin HCl (5MG Capsule, 1 Oral daily) Active. Medications Reconciled (Verbally; Fill Hx verified)  Diagnostic Studies History (April Garrison; June 09, 2018 10:25 AM) Nuclear stress test  04/02/2018: 1. Lexiscan stress test was performed. Exercise  capacity was not assessed. Stress symptoms included nausea, dizziness. Resting blood pressure was 142/68 mmHg, peak effect blood pressure was 110/50 mmHg. The resting and stress electrocardiogram demonstrated normal sinus rhythm, RBBB + LAHB, no resting arrhythmias and normal rest repolarization. Stress EKG is non diagnostic for ischemia as it is a pharmacologic stress. 2. The overall quality of the study is good. Left ventricular cavity is noted to be normal on the rest and stress studies. Gated SPECT images reveal normal myocardial thickening and wall motion. The left ventricular ejection fraction was calculated or visually estimated to be 55%. SPECT images demonstrate medium sized perfusion abnormality of mild intensity in the basal inferior, mid inferoseptal, mid inferior, apical septal and apical inferior myocardial wall(s) on the stress images, with moderate reversibility on rest images. Findings suggest medium sized RCA territory infarct with moderate superimposed ischemia. 3. Intermediate risk study. Carotid Doppler  05/13/2018: Stenosis in the right internal carotid artery (16-49%). Stenosis in the left internal carotid artery (16-49%). Mild stenosis in the left external carotid artery (<50%). Antegrade right vertebral artery flow. Antegrade left vertebral artery flow. Follow up in one year is appropriate if clinically indicated. Echocardiogram  05/13/2018 1. Left ventricle cavity is normal in size. Moderate concentric hypertrophy of the left ventricle. Borderline normal global wall motion. Visual EF is 50-55%. Doppler evidence of grade I (impaired) diastolic dysfunction. Calculated EF 55%. 2. Mild to moderate aortic regurgitation. Mild aortic valve leaflet calcification. 3. Trace mitral regurgitation. Mild calcification of the mitral valve annulus. 4. Trace tricuspid regurgitation.    Review of Systems Cody Page, MD; 05/30/2018 8:8 PM) General Present- Fatigue. Not  Present- Appetite Loss and Weight Gain. Respiratory Present- Difficulty Breathing on Exertion. Not Present- Chronic Cough, Wakes up from Sleep Wheezing or Short of Breath and Wheezing. Cardiovascular Present- Chest  Pain (describes as "burning") and Difficulty Breathing On Exertion. Not Present- Claudications, Edema, Fainting and Palpitations. Gastrointestinal Not Present- Black, Tarry Stool and Difficulty Swallowing. Musculoskeletal Present- Back Pain. Not Present- Decreased Range of Motion and Muscle Atrophy. Neurological Present- Dizziness, Trouble walking and Unsteadiness. Not Present- Attention Deficit. Psychiatric Not Present- Personality Changes and Suicidal Ideation. Endocrine Not Present- Cold Intolerance and Heat Intolerance. Hematology Not Present- Abnormal Bleeding. All other systems negative  Vitals (April Garrison; 05/20/2018 10:33 AM) 05/20/2018 10:28 AM Weight: 237.06 lb Height: 73in Body Surface Area: 2.31 m Body Mass Index: 31.28 kg/m  Pulse: 79 (Regular)  P.OX: 95% (Room air) BP: 154/84 (Sitting, Left Arm, Standard)       Physical Exam Cody Page, MD; 05/30/2018 8:8 PM) General Mental Status-Alert. General Appearance-Cooperative and Appears stated age. Build & Nutrition-Well built and Mildly obese.  Head and Neck Thyroid Gland Characteristics - normal size and consistency and no palpable nodules.  Chest and Lung Exam Chest and lung exam reveals -quiet, even and easy respiratory effort with no use of accessory muscles, non-tender and on auscultation, normal breath sounds, no adventitious sounds.  Cardiovascular Cardiovascular examination reveals -normal heart sounds, regular rate and rhythm with no murmurs.  Abdomen Palpation/Percussion Normal exam - Non Tender and No hepatosplenomegaly.  Peripheral Vascular Lower Extremity Inspection - Bilateral - Inspection Normal. Palpation - Temperature - Bilateral - Normal. Edema -  Bilateral - No edema. Femoral pulse - Bilateral - 2+(Bruit). Popliteal pulse - Left - 2+. Right - Absent. Dorsalis pedis pulse - Left - Absent. Right - 1+. Posterior tibial pulse - Bilateral - Absent. Carotid arteries - Bilateral-Soft Bruit.  Neurologic Neurologic evaluation reveals -alert and oriented x 3 with no impairment of recent or remote memory. Motor-Grossly intact without any focal deficits.  Musculoskeletal Global Assessment Left Lower Extremity - no deformities, masses or tenderness, no known fractures. Right Lower Extremity - no deformities, masses or tenderness, no known fractures.    Assessment & Plan Cody Page MD; 05/30/2018 8:08 PM) Angina pectoris (I20.9) Story: Lexiscan myoview stress test 04/02/2018: 1. Lexiscan stress test was performed. Exercise capacity was not assessed. Stress symptoms included nausea, dizziness. Resting blood pressure was 142/68 mmHg, peak effect blood pressure was 110/50 mmHg. The resting and stress electrocardiogram demonstrated normal sinus rhythm, RBBB + LAHB, no resting arrhythmias and normal rest repolarization. Stress EKG is non diagnostic for ischemia as it is a pharmacologic stress. 2. The overall quality of the study is good. Left ventricular cavity is noted to be normal on the rest and stress studies. Gated SPECT images reveal normal myocardial thickening and wall motion. The left ventricular ejection fraction was calculated or visually estimated to be 55%. SPECT images demonstrate medium sized perfusion abnormality of mild intensity in the basal inferior, mid inferoseptal, mid inferior, apical septal and apical inferior myocardial wall(s) on the stress images, with moderate reversibility on rest images. Findings suggest medium sized RCA territory infarct with moderate superimposed ischemia. 3. Intermediate risk study. Current Plans Started Nitrostat 0.4MG, 1 (one) Tablet every minutes as needed for chest pain, #25, 05/20/2018,  Ref. x1. Started Aspirin 81MG, 1 (one) Tablet daily, #30, 30 days starting 05/20/2018, Ref. x11, Mail Order #90, 90 days, Ref. x3. Started Atorvastatin Calcium 10MG, 1 (one) Tablet daily, #30, 30 days starting 05/20/2018, Ref. x2. METABOLIC PANEL, COMPREHENSIVE (80053) CBC & PLATELETS (AUTO) (85027) TSH (THYROID STIMULATING HORMONE) (96045) Dyspnea on exertion (R06.09) Story: Echo- 05/13/2018 1. Left ventricle cavity is normal in size. Moderate concentric hypertrophy  of the left ventricle. Borderline normal global wall motion. Visual EF is 50-55%. Doppler evidence of grade I (impaired) diastolic dysfunction. Calculated EF 55%. 2. Mild to moderate aortic regurgitation. Mild aortic valve leaflet calcification. 3. Trace mitral regurgitation. Mild calcification of the mitral valve annulus. Trifascicular block (I45.3) Story: EKG 04/01/2018: Sinus rhythm with first-degree AV block at the rate of 72 bpm, left axis deviation, left anterior fascicular block. Right under branch block. Right fascicular block. Benign essential hypertension (I10) Current Plans Changed amLODIPine Besylate 10MG, 1 (one) Tablet daily, #30, 30 days starting 05/20/2018, Ref. x2. Bilateral carotid bruits (R09.89) Story: Carotid artery duplex 05/13/2018: Stenosis in the right internal carotid artery (16-49%). Stenosis in the left internal carotid artery (16-49%). Mild stenosis in the left external carotid artery (<50%). Antegrade right vertebral artery flow. Antegrade left vertebral artery flow. Follow up in one year is appropriate if clinically indicated. Future Plans 05/19/2019: Complete duplex scan of bilateral carotid arteries (16109) - one time Laboratory examination (Z01.89) Story: 03/30/2018: eGFR 74, Creatinine 1.1, Potassium 4.9, BMP normal. Platelets 138, CBC otherwise normal. TSH 2.5. HB 14.0/HCT 41.9, platelets 138,000. Vitamin B12 373.  Labs 05/11/2017: CBC normal. Hepatic panel normal. Hyperlipemia  (E78.5) Current Plans LIPID PANEL (60454) Note:-  Recommendations:  Mr. Neidhardt is a Caucasian male with history of lumbar radiculopathy, GERD, hypertension, migraines, and hyperlipidemia who presents with shortness of breath and chest pain. Symptoms have been worsening for the past 3-6 months. The shortness of breath occurs with exertion. He has to stop when walking to his mailbox (~50 ft) because he is out of breath. He is also noticing chest pain during intercourse.  He is here on a three-week visit following his exertional chest pain suggestive of angina pectoris and also worsening dyspnea, he blood pressure is still elevated in spite of patient being on to and hypertensive medications, I'll increase amlodipine to 10 mg.  As his symptoms is fairly severe and class III, in spite of maximal medical therapy, I will set him up for left heart catheterization.  I do not have his lipids, I'll obtain lipid profile testing, but I will go ahead and start him on a statin therapy. He has got thrombocytopenia and anterolateral repeat his CBC, but for now will start him on aspirin 81 mg daily. He is going on a cruise next week, I do not see any contraindication for him to travel as angina pectoris, appears to be chronic stable angina. Schedule for cardiac catheterization, and possible angioplasty. We discussed regarding risks, benefits, alternatives to this including stress testing, CTA and continued medical therapy. Patient wants to proceed. Understands <1-2% risk of death, stroke, MI, urgent CABG, bleeding, infection, renal failure but not limited to these.   Suspect he has cerebellar ataxia, he is now willing to see a neurologist, referral was made to [facility].  CC Dr. Jani Gravel.   Signed by Cody Page, MD (05/30/2018 8:09 PM)

## 2018-06-01 ENCOUNTER — Ambulatory Visit (HOSPITAL_COMMUNITY)
Admission: RE | Admit: 2018-06-01 | Discharge: 2018-06-02 | Disposition: A | Discharge status: Home / Self Care | Payer: Medicare Other | Source: Ambulatory Visit | Attending: Cardiology | Admitting: Cardiology

## 2018-06-01 ENCOUNTER — Ambulatory Visit (HOSPITAL_COMMUNITY)
Admission: RE | Disposition: A | Discharge status: Home / Self Care | Payer: Self-pay | Source: Ambulatory Visit | Attending: Cardiology

## 2018-06-01 ENCOUNTER — Encounter (HOSPITAL_COMMUNITY): Payer: Self-pay | Admitting: General Practice

## 2018-06-01 ENCOUNTER — Other Ambulatory Visit: Payer: Self-pay

## 2018-06-01 DIAGNOSIS — Z981 Arthrodesis status: Secondary | ICD-10-CM | POA: Diagnosis not present

## 2018-06-01 DIAGNOSIS — I25119 Atherosclerotic heart disease of native coronary artery with unspecified angina pectoris: Secondary | ICD-10-CM | POA: Insufficient documentation

## 2018-06-01 DIAGNOSIS — I453 Trifascicular block: Secondary | ICD-10-CM | POA: Diagnosis not present

## 2018-06-01 DIAGNOSIS — G43909 Migraine, unspecified, not intractable, without status migrainosus: Secondary | ICD-10-CM | POA: Diagnosis not present

## 2018-06-01 DIAGNOSIS — I25118 Atherosclerotic heart disease of native coronary artery with other forms of angina pectoris: Secondary | ICD-10-CM | POA: Diagnosis not present

## 2018-06-01 DIAGNOSIS — Z79899 Other long term (current) drug therapy: Secondary | ICD-10-CM | POA: Insufficient documentation

## 2018-06-01 DIAGNOSIS — R0609 Other forms of dyspnea: Secondary | ICD-10-CM | POA: Insufficient documentation

## 2018-06-01 DIAGNOSIS — K219 Gastro-esophageal reflux disease without esophagitis: Secondary | ICD-10-CM | POA: Insufficient documentation

## 2018-06-01 DIAGNOSIS — Z96652 Presence of left artificial knee joint: Secondary | ICD-10-CM | POA: Insufficient documentation

## 2018-06-01 DIAGNOSIS — Z9889 Other specified postprocedural states: Secondary | ICD-10-CM | POA: Diagnosis not present

## 2018-06-01 DIAGNOSIS — I6523 Occlusion and stenosis of bilateral carotid arteries: Secondary | ICD-10-CM | POA: Diagnosis not present

## 2018-06-01 DIAGNOSIS — I10 Essential (primary) hypertension: Secondary | ICD-10-CM | POA: Diagnosis not present

## 2018-06-01 DIAGNOSIS — Z9861 Coronary angioplasty status: Secondary | ICD-10-CM

## 2018-06-01 DIAGNOSIS — E785 Hyperlipidemia, unspecified: Secondary | ICD-10-CM | POA: Diagnosis not present

## 2018-06-01 DIAGNOSIS — I209 Angina pectoris, unspecified: Secondary | ICD-10-CM | POA: Diagnosis present

## 2018-06-01 DIAGNOSIS — Z955 Presence of coronary angioplasty implant and graft: Secondary | ICD-10-CM

## 2018-06-01 HISTORY — DX: Low back pain: M54.5

## 2018-06-01 HISTORY — DX: Other chronic pain: G89.29

## 2018-06-01 HISTORY — PX: LEFT HEART CATH AND CORONARY ANGIOGRAPHY: CATH118249

## 2018-06-01 HISTORY — DX: Low back pain, unspecified: M54.50

## 2018-06-01 HISTORY — PX: CORONARY ANGIOPLASTY WITH STENT PLACEMENT: SHX49

## 2018-06-01 HISTORY — PX: CORONARY STENT INTERVENTION: CATH118234

## 2018-06-01 LAB — POCT ACTIVATED CLOTTING TIME
ACTIVATED CLOTTING TIME: 230 s
Activated Clotting Time: 257 seconds
Activated Clotting Time: 301 seconds
Activated Clotting Time: 241 seconds

## 2018-06-01 SURGERY — LEFT HEART CATH AND CORONARY ANGIOGRAPHY
Anesthesia: LOCAL

## 2018-06-01 MED ORDER — HEPARIN SODIUM (PORCINE) 1000 UNIT/ML IJ SOLN
INTRAMUSCULAR | Status: DC | PRN
  Administered 2018-06-01 (×2): 5000 [IU] via INTRAVENOUS
  Administered 2018-06-01: 3000 [IU] via INTRAVENOUS
  Administered 2018-06-01: 2000 [IU] via INTRAVENOUS
  Administered 2018-06-01: 3000 [IU] via INTRAVENOUS

## 2018-06-01 MED ORDER — MIDAZOLAM HCL 2 MG/2ML IJ SOLN
INTRAMUSCULAR | Status: AC
  Filled 2018-06-01: qty 2

## 2018-06-01 MED ORDER — CLOPIDOGREL BISULFATE 300 MG PO TABS
ORAL_TABLET | ORAL | Status: DC | PRN
  Administered 2018-06-01: 600 mg via ORAL

## 2018-06-01 MED ORDER — SODIUM CHLORIDE 0.9 % IV SOLN: 250.0000 mL | INTRAVENOUS | Status: DC | PRN

## 2018-06-01 MED ORDER — SODIUM CHLORIDE 0.9% FLUSH: 3.0000 mL | Freq: Two times a day (BID) | INTRAVENOUS | Status: DC

## 2018-06-01 MED ORDER — ASPIRIN 81 MG PO CHEW: 81.0000 mg | CHEWABLE_TABLET | ORAL | Status: DC

## 2018-06-01 MED ORDER — SODIUM CHLORIDE 0.9 % WEIGHT BASED INFUSION: 1.0000 mL/kg/h | INTRAVENOUS | Status: DC

## 2018-06-01 MED ORDER — HYDRALAZINE HCL 20 MG/ML IJ SOLN: 5.0000 mg | INTRAMUSCULAR | Status: AC | PRN

## 2018-06-01 MED ORDER — CLOPIDOGREL BISULFATE 300 MG PO TABS
ORAL_TABLET | ORAL | Status: AC
  Filled 2018-06-01: qty 2

## 2018-06-01 MED ORDER — LISINOPRIL 10 MG PO TABS
10.0000 mg | ORAL_TABLET | Freq: Every day | ORAL | Status: DC
  Administered 2018-06-02: 10 mg via ORAL
  Filled 2018-06-01: qty 1

## 2018-06-01 MED ORDER — FAMOTIDINE IN NACL 20-0.9 MG/50ML-% IV SOLN
INTRAVENOUS | Status: AC
  Filled 2018-06-01: qty 50

## 2018-06-01 MED ORDER — PANTOPRAZOLE SODIUM 20 MG PO TBEC: 40.0000 mg | DELAYED_RELEASE_TABLET | Freq: Every day | ORAL | Status: DC | PRN

## 2018-06-01 MED ORDER — SODIUM CHLORIDE 0.9% FLUSH: 3.0000 mL | INTRAVENOUS | Status: DC | PRN

## 2018-06-01 MED ORDER — NITROGLYCERIN 1 MG/10 ML FOR IR/CATH LAB
INTRA_ARTERIAL | Status: AC
  Filled 2018-06-01: qty 10

## 2018-06-01 MED ORDER — TERAZOSIN HCL 5 MG PO CAPS
5.0000 mg | ORAL_CAPSULE | Freq: Every day | ORAL | Status: DC
  Administered 2018-06-01: 22:00:00 5 mg via ORAL
  Filled 2018-06-01: qty 1

## 2018-06-01 MED ORDER — HEPARIN (PORCINE) IN NACL 1000-0.9 UT/500ML-% IV SOLN
INTRAVENOUS | Status: AC
  Filled 2018-06-01: qty 1000

## 2018-06-01 MED ORDER — ANGIOPLASTY BOOK
Freq: Once | Status: DC
  Filled 2018-06-01: qty 1

## 2018-06-01 MED ORDER — NITROGLYCERIN 1 MG/10 ML FOR IR/CATH LAB
INTRA_ARTERIAL | Status: DC | PRN
  Administered 2018-06-01 (×4): 200 ug via INTRACORONARY

## 2018-06-01 MED ORDER — SODIUM CHLORIDE 0.9% FLUSH
3.0000 mL | Freq: Two times a day (BID) | INTRAVENOUS | Status: DC
  Administered 2018-06-01: 3 mL via INTRAVENOUS

## 2018-06-01 MED ORDER — PROPRANOLOL HCL ER BEADS 120 MG PO CP24: 120.0000 mg | ORAL_CAPSULE | Freq: Every day | ORAL | Status: DC | PRN

## 2018-06-01 MED ORDER — FENTANYL CITRATE (PF) 100 MCG/2ML IJ SOLN
INTRAMUSCULAR | Status: DC | PRN
  Administered 2018-06-01: 50 ug via INTRAVENOUS
  Administered 2018-06-01 (×2): 25 ug via INTRAVENOUS

## 2018-06-01 MED ORDER — MECLIZINE HCL 25 MG PO TABS
25.0000 mg | ORAL_TABLET | Freq: Three times a day (TID) | ORAL | Status: DC | PRN
  Filled 2018-06-01: qty 1

## 2018-06-01 MED ORDER — ACETAMINOPHEN 325 MG PO TABS: 650.0000 mg | ORAL_TABLET | ORAL | Status: DC | PRN

## 2018-06-01 MED ORDER — AMLODIPINE BESYLATE 5 MG PO TABS
10.0000 mg | ORAL_TABLET | Freq: Every day | ORAL | Status: DC
  Administered 2018-06-02: 10:00:00 10 mg via ORAL
  Filled 2018-06-01: qty 2

## 2018-06-01 MED ORDER — SODIUM CHLORIDE 0.9 % WEIGHT BASED INFUSION
1.0000 mL/kg/h | INTRAVENOUS | Status: AC
  Administered 2018-06-01: 1 mL/kg/h via INTRAVENOUS

## 2018-06-01 MED ORDER — FAMOTIDINE IN NACL 20-0.9 MG/50ML-% IV SOLN
INTRAVENOUS | Status: AC | PRN
  Administered 2018-06-01: 20 mg via INTRAVENOUS

## 2018-06-01 MED ORDER — ONDANSETRON HCL 4 MG/2ML IJ SOLN: 4.0000 mg | Freq: Four times a day (QID) | INTRAMUSCULAR | Status: DC | PRN

## 2018-06-01 MED ORDER — VERAPAMIL HCL 2.5 MG/ML IV SOLN
INTRAVENOUS | Status: AC
  Filled 2018-06-01: qty 2

## 2018-06-01 MED ORDER — HEPARIN (PORCINE) IN NACL 1000-0.9 UT/500ML-% IV SOLN
INTRAVENOUS | Status: DC | PRN
  Administered 2018-06-01 (×2): 500 mL

## 2018-06-01 MED ORDER — LIDOCAINE HCL (PF) 1 % IJ SOLN
INTRAMUSCULAR | Status: AC
  Filled 2018-06-01: qty 30

## 2018-06-01 MED ORDER — ASPIRIN EC 81 MG PO TBEC
81.0000 mg | DELAYED_RELEASE_TABLET | Freq: Every day | ORAL | Status: DC
  Administered 2018-06-02: 81 mg via ORAL
  Filled 2018-06-01: qty 1

## 2018-06-01 MED ORDER — ATORVASTATIN CALCIUM 10 MG PO TABS
10.0000 mg | ORAL_TABLET | Freq: Every day | ORAL | Status: DC
  Administered 2018-06-01 – 2018-06-02 (×2): 10 mg via ORAL
  Filled 2018-06-01 (×2): qty 1

## 2018-06-01 MED ORDER — MIDAZOLAM HCL 2 MG/2ML IJ SOLN
INTRAMUSCULAR | Status: DC | PRN
  Administered 2018-06-01 (×2): 1 mg via INTRAVENOUS

## 2018-06-01 MED ORDER — SODIUM CHLORIDE 0.9 % WEIGHT BASED INFUSION
3.0000 mL/kg/h | INTRAVENOUS | Status: DC
  Administered 2018-06-01: 3 mL/kg/h via INTRAVENOUS

## 2018-06-01 MED ORDER — IOHEXOL 350 MG/ML SOLN
INTRAVENOUS | Status: DC | PRN
  Administered 2018-06-01: 280 mL via INTRA_ARTERIAL

## 2018-06-01 MED ORDER — FENTANYL CITRATE (PF) 100 MCG/2ML IJ SOLN
INTRAMUSCULAR | Status: AC
  Filled 2018-06-01: qty 2

## 2018-06-01 MED ORDER — PRAMIPEXOLE DIHYDROCHLORIDE 0.25 MG PO TABS
0.5000 mg | ORAL_TABLET | Freq: Every day | ORAL | Status: DC
  Administered 2018-06-01: 22:00:00 0.5 mg via ORAL
  Filled 2018-06-01: qty 2

## 2018-06-01 MED ORDER — VERAPAMIL HCL 2.5 MG/ML IV SOLN
INTRAVENOUS | Status: DC | PRN
  Administered 2018-06-01: 5 mL via INTRA_ARTERIAL

## 2018-06-01 MED ORDER — NITROGLYCERIN 0.4 MG SL SUBL: 0.4000 mg | SUBLINGUAL_TABLET | SUBLINGUAL | Status: DC | PRN

## 2018-06-01 MED ORDER — CLOPIDOGREL BISULFATE 75 MG PO TABS
75.0000 mg | ORAL_TABLET | Freq: Every day | ORAL | Status: DC
  Administered 2018-06-02: 09:00:00 75 mg via ORAL
  Filled 2018-06-01: qty 1

## 2018-06-01 SURGICAL SUPPLY — 23 items
BALLN EMERGE MR 1.5X12 (BALLOONS) ×2
BALLN SAPPHIRE 2.5X15 (BALLOONS) ×2
BALLN ~~LOC~~ EMERGE MR 2.75X30 (BALLOONS) ×2
BALLOON EMERGE MR 1.5X12 (BALLOONS) ×1 IMPLANT
BALLOON SAPPHIRE 2.5X15 (BALLOONS) ×1 IMPLANT
BALLOON ~~LOC~~ EMERGE MR 2.75X30 (BALLOONS) ×1 IMPLANT
CATH OPTITORQUE TIG 4.0 5F (CATHETERS) ×2 IMPLANT
CATH VISTA GUIDE 6FR XB3.5 (CATHETERS) ×2 IMPLANT
DEVICE RAD COMP TR BAND LRG (VASCULAR PRODUCTS) ×2 IMPLANT
GLIDESHEATH SLEND A-KIT 6F 20G (SHEATH) ×2 IMPLANT
GUIDEWIRE INQWIRE 1.5J.035X260 (WIRE) ×1 IMPLANT
INQWIRE 1.5J .035X260CM (WIRE) ×2
KIT ENCORE 26 ADVANTAGE (KITS) ×2 IMPLANT
KIT HEART LEFT (KITS) ×2 IMPLANT
PACK CARDIAC CATHETERIZATION (CUSTOM PROCEDURE TRAY) ×2 IMPLANT
STENT ORSIRO 2.5X13 (Permanent Stent) ×2 IMPLANT
STENT ORSIRO 2.5X40 (Permanent Stent) ×2 IMPLANT
STENT ORSIRO 2.75X13 (Permanent Stent) ×2 IMPLANT
STENT ORSIRO 2.75X15 (Permanent Stent) ×2 IMPLANT
TRANSDUCER W/STOPCOCK (MISCELLANEOUS) ×2 IMPLANT
TUBING CIL FLEX 10 FLL-RA (TUBING) ×2 IMPLANT
WIRE ASAHI PROWATER 180CM (WIRE) ×2 IMPLANT
WIRE COUGAR XT STRL 190CM (WIRE) ×2 IMPLANT

## 2018-06-01 NOTE — Progress Notes (Signed)
Rennis Harding, Rn informed of lab work done 05-20-18 states no need to redraw those labs ordered.

## 2018-06-01 NOTE — Interval H&P Note (Signed)
History and Physical Interval Note:  06/01/2018 10:23 AM  Cody Floyd  has presented today for surgery, with the diagnosis of CP  The various methods of treatment have been discussed with the patient and family. After consideration of risks, benefits and other options for treatment, the patient has consented to  Procedure(s): LEFT HEART CATH AND CORONARY ANGIOGRAPHY (N/A) and possible intervention as a surgical intervention .  The patient's history has been reviewed, patient examined, no change in status, stable for surgery.  I have reviewed the patient's chart and labs.  Questions were answered to the patient's satisfaction.   Symptom Status: Ischemic Symptoms Non-invasive Testing: Indeterminate If no or indeterminate stress test, FFR/iFR results in all diseased vessels: Not done Diabetes Mellitus: No S/P CABG: No Antianginal therapy (number of long-acting drugs): >=2 Patient undergoing renal transplant: No Patient undergoing percutaneous valve procedure: No   1 Vessel Disease No proximal LAD involvement, No proximal left dominant LCX involvement  PCI: Not rated  CABG: Not rated Proximal left dominant LCX involvement  PCI: Not rated  CABG: Not rated Proximal LAD involvement  PCI: Not rated  CABG: Not rated  2 Vessel Disease No proximal LAD involvement  PCI: Not rated  CABG: Not rated Proximal LAD involvement  PCI: Not rated  CABG: Not rated  3 Vessel Disease Low disease complexity (e.g., focal stenoses, SYNTAX <=22)  PCI: Not rated  CABG: Not rated Intermediate or high disease complexity (e.g., SYNTAX >=23)  PCI: Not rated  CABG: Not rated  Left Main Disease Isolated LMCA disease: ostial or midshaft  PCI: A (7);  Indication 24  CABG: A (9);  Indication 24 Isolated LMCA disease: bifurcation involvement  PCI: M (6);  Indication 25  CABG: A (9);  Indication 25 LMCA ostial or midshaft, concurrent low disease burden multivessel disease (e.g., 1-2 additional focal  stenoses, SYNTAX <=22)  PCI: A (7);  Indication 26  CABG: A (9);  Indication 26 LMCA ostial or midshaft, concurrent intermediate or high disease burden multivessel disease (e.g., 1-2 additional bifurcation stenoses, long stenoses, SYNTAX >=23)  PCI: M (4);  Indication 27  CABG: A (9);  Indication 27 LMCA bifurcation involvement, concurrent low disease burden multivessel disease (e.g., 1-2 additional focal stenoses, SYNTAX <=22)  PCI: M (6);  Indication 28  CABG: A (9);  Indication 28 LMCA bifurcation involvement, concurrent intermediate or high disease burden multivessel disease (e.g., 1-2 additional bifurcation stenoses, long stenoses, SYNTAX >=23)  PCI: R (3);  Indication 29  CABG: A (9);  Indication 29  Notes:  A indicates appropriate. M indicates may be appropriate. R indicates rarely appropriate. Number in parentheses is median score for that indication. Reclassify indicates number of functionally diseased vessels should be decreased given negative FFR/iFR. Re-evaluate the scenario interpreting any FFR/iFR negative vessel as being not significantly stenosed.  Disease means involved vessel provides flow to a sufficient amount of myocardium to be clinically important.  If FFR testing indicates a vessel is not significant, that vessel should not be considered diseased (and the patient should be reclassified with respect to extent of functionally significant disease).  Proximal LAD + proximal left dominant LCX is considered 3 vessel CAD  2 Vessel CAD with FFR/iFR abnormal in only 1 but not both is considered 1 vessel CAD  Disease complexity includes occlusion, bifurcation, trifurcation, ostial, >20 mm, tortuosity, calcification, thrombus  LMCA disease is >=50% by angiography, MLD <2.8 mm, MLA <6 mm2; MLA 6-7.5 mm2 requires further physiologic  See Table B for  risk stratification based on noninvasive testing  Journal of the [employer] of Cardiology Mar 2017, 23391; DOI:  10.1016/j.jacc.2017.02.001 [email or web address].[email or web address].full-text.pdf This App  2018 by the Society for Cardiovascular Angiography and Interventions    Adrian Prows

## 2018-06-01 NOTE — Progress Notes (Signed)
[facility] BAND REMOVAL  LOCATION:    right radial  DEFLATED PER PROTOCOL:    Yes.    TIME BAND OFF / DRESSING APPLIED:    1640   SITE UPON ARRIVAL:    Level 0  SITE AFTER BAND REMOVAL:    Level 1  CIRCULATION SENSATION AND MOVEMENT:    Within Normal Limits   Yes.    COMMENTS:   TR Band removed per protocol, patient tolerated well. No bleeding noted, no hematoma, soft but puffy to touch, bruise noted. Upon patient's arrival to unit from cath lab, small hematoma noted that was expressed by holding pressure, bruised noted. No S/S of distress noted or complaints voiced at this time. Call bell is in reach and bed is in lowest position with bed alarm in place and activated. Post removal instructions provided and teach back effective.

## 2018-06-02 ENCOUNTER — Encounter (HOSPITAL_COMMUNITY): Payer: Self-pay | Admitting: Cardiology

## 2018-06-02 DIAGNOSIS — I6523 Occlusion and stenosis of bilateral carotid arteries: Secondary | ICD-10-CM | POA: Diagnosis not present

## 2018-06-02 DIAGNOSIS — R0609 Other forms of dyspnea: Secondary | ICD-10-CM | POA: Diagnosis not present

## 2018-06-02 DIAGNOSIS — Z9889 Other specified postprocedural states: Secondary | ICD-10-CM | POA: Diagnosis not present

## 2018-06-02 DIAGNOSIS — G43909 Migraine, unspecified, not intractable, without status migrainosus: Secondary | ICD-10-CM | POA: Diagnosis not present

## 2018-06-02 DIAGNOSIS — E785 Hyperlipidemia, unspecified: Secondary | ICD-10-CM | POA: Diagnosis not present

## 2018-06-02 DIAGNOSIS — Z79899 Other long term (current) drug therapy: Secondary | ICD-10-CM | POA: Diagnosis not present

## 2018-06-02 DIAGNOSIS — I25119 Atherosclerotic heart disease of native coronary artery with unspecified angina pectoris: Secondary | ICD-10-CM | POA: Diagnosis not present

## 2018-06-02 DIAGNOSIS — Z96652 Presence of left artificial knee joint: Secondary | ICD-10-CM | POA: Diagnosis not present

## 2018-06-02 DIAGNOSIS — I10 Essential (primary) hypertension: Secondary | ICD-10-CM | POA: Diagnosis not present

## 2018-06-02 DIAGNOSIS — Z981 Arthrodesis status: Secondary | ICD-10-CM | POA: Diagnosis not present

## 2018-06-02 DIAGNOSIS — K219 Gastro-esophageal reflux disease without esophagitis: Secondary | ICD-10-CM | POA: Diagnosis not present

## 2018-06-02 DIAGNOSIS — I453 Trifascicular block: Secondary | ICD-10-CM | POA: Diagnosis not present

## 2018-06-02 LAB — CBC
HCT: 39.5 % (ref 39.0–52.0)
Hemoglobin: 12.6 g/dL — ABNORMAL LOW (ref 13.0–17.0)
MCH: 27.5 pg (ref 26.0–34.0)
MCHC: 31.9 g/dL (ref 30.0–36.0)
MCV: 86.1 fL (ref 80.0–100.0)
NRBC: 0 % (ref 0.0–0.2)
PLATELETS: 144 10*3/uL — AB (ref 150–400)
RBC: 4.59 MIL/uL (ref 4.22–5.81)
RDW: 13.8 % (ref 11.5–15.5)
WBC: 7.1 10*3/uL (ref 4.0–10.5)

## 2018-06-02 LAB — BASIC METABOLIC PANEL
ANION GAP: 9 (ref 5–15)
BUN: 17 mg/dL (ref 8–23)
CO2: 23 mmol/L (ref 22–32)
CREATININE: 1.05 mg/dL (ref 0.61–1.24)
Calcium: 8.8 mg/dL — ABNORMAL LOW (ref 8.9–10.3)
Chloride: 107 mmol/L (ref 98–111)
GFR calc non Af Amer: >60 mL/min (ref >60)
Glucose, Bld: 116 mg/dL — ABNORMAL HIGH (ref 70–99)
Potassium: 3.7 mmol/L (ref 3.5–5.1)
SODIUM: 139 mmol/L (ref 135–145)

## 2018-06-02 MED ORDER — CLOPIDOGREL BISULFATE 75 MG PO TABS: 75.0000 mg | ORAL_TABLET | Freq: Every day | ORAL | 1 refills | Status: DC

## 2018-06-02 MED ORDER — ANGIOPLASTY BOOK: 1.0000 | Freq: Once | Status: AC

## 2018-06-02 MED ORDER — PANTOPRAZOLE SODIUM 40 MG PO TBEC: 40.0000 mg | DELAYED_RELEASE_TABLET | Freq: Every day | ORAL | 1 refills | Status: DC

## 2018-06-02 MED FILL — Lidocaine HCl Local Preservative Free (PF) Inj 1%: INTRAMUSCULAR | Qty: 30 | Status: AC

## 2018-06-02 NOTE — Progress Notes (Signed)
CARDIAC REHAB PHASE I   PRE:  Rate/Rhythm: 80 SR    BP: sitting 143/60    SaO2:   MODE:  Ambulation: 500 ft   POST:  Rate/Rhythm: 94 SR    BP: sitting 145/65     SaO2:   Pt used RW. Able to stand and walk with assist x1. Has narrow stance, slightly imbalanced. Gave reminders how to use RW at home. He also uses his cane. Ed completed, pt voices understanding. Understands importance of Plavix/ASA. We also discussed the importance of safety and the risk of bleeding if he falls. Will refer to [place]. Encouraged decreasing sugar due to A1C 6.1. [id number]   Darrick Meigs CES, ACSM 06/02/2018 8:57 AM

## 2018-06-02 NOTE — Discharge Summary (Signed)
Physician Discharge Summary  Patient ID: RAHEEN CAPILI MRN: [id number] DOB/AGE: [birth year] 77 y.o.  Admit date: 06/01/2018 Discharge date: 06/02/2018  Primary Discharge Diagnosis Coronary artery disease of the native vessel with angina pectoris S/P PTCA status: 06/02/2018:  S/P 2.5 x 40, 2.75 x 15 and 2.75 x 13 mm Orsiro DES 99% to0% with TIMI 3 to 3 flow. Large circumflex with large OM1, OM 1 proximal 80% stenosis to 0% S/P 2.5 x 13 mm Orsiro DES with TIMI-3 to TIMI-3 flow.  Otherwise circumflex normal.  Residual 50% mid RCA stenosis, medical therapy.  Secondary Discharge Diagnosis Asymptomatic trifascicular block, patient on moderate doses of beta-blocker. Hypertension, controlled Hyperlipidemia, controlled Mild asymptomatic bilateral carotid artery stenosis Peripheral arterial disease with symptoms of claudication, class II.  Significant Diagnostic Studies:  EKG 06/02/2018: Sinus rhythm with first-degree AV block at rate of 66 bpm, left axis deviation, left anterior fascicular block.  Right bundle branch block.  Trifascicular block.  Unchanged from prior EKGs.  Coronary angiogram 06/01/2018: Short left main, diffusely diseased proximal and mid LAD with 80% proximal 99% mid LAD stenosis at D1 origin, D1 is very large.  Mild ectasia noted in the LAD.  S/P 2.5 x 40, 2.75 x 15 and 2.75 x 13 mm Orsiro DES 99% to0% with TIMI 3 to 3 flow. Large circumflex with large OM1, OM 1 proximal 80% stenosis to 0% S/P 2.5 x 13 mm Orsiro DES with TIMI-3 to TIMI-3 flow.  Otherwise circumflex normal. RCA very large with large PL and PDA branches mid segment has 50% stenosis. LV: Normal LVEDP and LV systolic function.  Nuclear stress test  04/02/2018: 1. Lexiscan stress test was performed. Exercise capacity was not assessed. Stress symptoms included nausea, dizziness. Resting blood pressure was 142/68 mmHg, peak effect blood pressure was 110/50 mmHg. The resting and stress electrocardiogram demonstrated  normal sinus rhythm, RBBB + LAHB, no resting arrhythmias and normal rest repolarization. Stress EKG is non diagnostic for ischemia as it is a pharmacologic stress. 2. The overall quality of the study is good. Left ventricular cavity is noted to be normal on the rest and stress studies. Gated SPECT images reveal normal myocardial thickening and wall motion. The left ventricular ejection fraction was calculated or visually estimated to be 55%. SPECT images demonstrate medium sized perfusion abnormality of mild intensity in the basal inferior, mid inferoseptal, mid inferior, apical septal and apical inferior myocardial wall(s) on the stress images, with moderate reversibility on rest images. Findings suggest medium sized RCA territory infarct with moderate superimposed ischemia. 3. Intermediate risk study. Carotid Doppler  05/13/2018: Stenosis in the right internal carotid artery (16-49%). Stenosis in the left internal carotid artery (16-49%). Mild stenosis in the left external carotid artery (<50%). Antegrade right vertebral artery flow. Antegrade left vertebral artery flow. Follow up in one year is appropriate if clinically indicated. Echocardiogram  05/13/2018 1. Left ventricle cavity is normal in size. Moderate concentric hypertrophy of the left ventricle. Borderline normal global wall motion. Visual EF is 50-55%. Doppler evidence of grade I (impaired) diastolic dysfunction. Calculated EF 55%. 2. Mild to moderate aortic regurgitation. Mild aortic valve leaflet calcification. 3. Trace mitral regurgitation. Mild calcification of the mitral valve annulus. 4. Trace tricuspid regurgitation.  Hospital Course: Mr. Iwanicki is a Caucasian male with history of lumbar radiculopathy, GERD, hypertension, migraines, and hyperlipidemia who presents with shortness of breath and chest pain.  He has had outpatient stress test which revealed inferolateral ischemia, intermediate risk study.  He was started on aggressive  medical therapy for angina pectoris, but due to intractable angina pectoris in spite of aggressive medical therapy, he was brought to the cardiac catheterization lab.  Coronary angiography revealed severe two-vessel disease involving circumflex and LAD for which he underwent successful angioplasty with implantation of drug-eluting stents.  He was deemed fit for discharge the following morning, remained angina free.  Importance of taking dual antiplatelet therapy was discussed in detail.  He will be followed up in the outpatient basis.  Recommendations on discharge: Dual antiplatelet therapy for 6 months and ASA indefinitely thereafter.  Discharge Exam: Blood pressure (!) 141/70, pulse 75, temperature 98.2 F (36.8 C), resp. rate (!) 27, height 6\' 1"  (1.854 m), weight 107.4 kg, SpO2 96 %.  Physical Exam  Constitutional: He is oriented to person, place, and time. He appears well-developed and well-nourished. No distress.  HENT:  Head: Atraumatic.  Eyes: Conjunctivae are normal.  Neck: Neck supple.  Cardiovascular: Regular rhythm.  Murmur (SEM II/VI in right sternal border. ) heard. Pulses:      Carotid pulses are on the right side with bruit, and on the left side with bruit.      Radial pulses are 3+ on the right side, and 3+ on the left side.       Femoral pulses are 3+ on the right side, and 3+ on the left side.      Popliteal pulses are 1+ on the right side, and 1+ on the left side.       Dorsalis pedis pulses are 0 on the right side, and 0 on the left side.       Posterior tibial pulses are 1+ on the right side, and 0 on the left side.  Pulmonary/Chest: Effort normal and breath sounds normal.  Abdominal: Soft. Bowel sounds are normal.  Musculoskeletal: Normal range of motion. He exhibits no edema.  Neurological: He is alert and oriented to person, place, and time.  Skin: Skin is warm and dry.   Labs:   Lab Results  Component Value Date   WBC 7.1 06/02/2018   HGB 12.6 (L) 06/02/2018    HCT 39.5 06/02/2018   MCV 86.1 06/02/2018   PLT 144 (L) 06/02/2018    Recent Labs  Lab 06/02/18 0213  NA 139  K 3.7  CL 107  CO2 23  BUN 17  CREATININE 1.05  CALCIUM 8.8*  GLUCOSE 116*   FOLLOW UP PLANS AND APPOINTMENTS  Allergies as of 06/02/2018   No Known Allergies     Medication List    STOP taking these medications   omeprazole 20 MG tablet Commonly known as:  PRILOSEC OTC Replaced by:  pantoprazole 40 MG tablet     TAKE these medications   amLODipine 10 MG tablet Commonly known as:  NORVASC Take 10 mg by mouth daily.   angioplasty book Misc 1 each by Does not apply route once for 1 dose.   aspirin EC 81 MG tablet Take 81 mg by mouth daily.   atorvastatin 10 MG tablet Commonly known as:  LIPITOR Take 10 mg by mouth daily.   clopidogrel 75 MG tablet Commonly known as:  PLAVIX Take 1 tablet (75 mg total) by mouth daily with breakfast.   lisinopril 10 MG tablet Commonly known as:  PRINIVIL,ZESTRIL Take 10 mg by mouth daily.   meclizine 25 MG tablet Commonly known as:  ANTIVERT Take 25 mg by mouth 3 (three) times daily as needed for dizziness.   nitroGLYCERIN 0.4 MG SL tablet Commonly  known as:  NITROSTAT Place 0.4 mg under the tongue every 5 (five) minutes as needed for chest pain.   pantoprazole 40 MG tablet Commonly known as:  PROTONIX Take 1 tablet (40 mg total) by mouth daily before breakfast. Replaces:  omeprazole 20 MG tablet   pramipexole 0.5 MG tablet Commonly known as:  MIRAPEX Take 0.5 mg by mouth at bedtime.   propranolol 120 MG 24 hr capsule Commonly known as:  INNOPRAN XL Take 120 mg by mouth daily as needed (for BP and migraine).   terazosin 5 MG capsule Commonly known as:  HYTRIN Take 5 mg by mouth at bedtime.       Adrian Prows, MD 06/02/2018, 8:34 AM  Pager: [phone] Office: [phone] If no answer: [phone]

## 2018-06-02 NOTE — Progress Notes (Signed)
Patient discharged to home with wife via personal automobile.  Patient chose to ambulate to exit.  Discharge instructions reviewed with patient and spouse in full.  Comprehension of instructions ascertained via "teach-back" method.

## 2018-06-08 ENCOUNTER — Telehealth (HOSPITAL_COMMUNITY): Payer: Self-pay

## 2018-06-08 NOTE — Telephone Encounter (Signed)
Pt insurance is active and benefits verified through [facility] Co-pay $20.00, DED $0.00/$0.00 met, out of pocket $6,700.00/500.27 met, co-insurance 0%. No pre-authorization. Passport, 06/08/18 @ 12:06PM, REF# [id number]

## 2018-06-08 NOTE — Telephone Encounter (Signed)
Patient returned CR phone call, he stated he is not able to join at this time. He is due to have back surgery in Dec. And is not sure how he will feel afterwards.    Closed referral.

## 2018-06-08 NOTE — Telephone Encounter (Signed)
Called patient to see if he is interested in the Cardiac Rehab Program. LMTCB °

## 2018-06-09 DIAGNOSIS — I10 Essential (primary) hypertension: Secondary | ICD-10-CM | POA: Diagnosis not present

## 2018-06-09 DIAGNOSIS — R0609 Other forms of dyspnea: Secondary | ICD-10-CM | POA: Diagnosis not present

## 2018-06-09 DIAGNOSIS — I453 Trifascicular block: Secondary | ICD-10-CM | POA: Diagnosis not present

## 2018-06-09 DIAGNOSIS — I25118 Atherosclerotic heart disease of native coronary artery with other forms of angina pectoris: Secondary | ICD-10-CM | POA: Diagnosis not present

## 2018-06-22 DIAGNOSIS — M4310 Spondylolisthesis, site unspecified: Secondary | ICD-10-CM | POA: Diagnosis not present

## 2018-07-06 DIAGNOSIS — E785 Hyperlipidemia, unspecified: Secondary | ICD-10-CM | POA: Diagnosis not present

## 2018-07-15 DIAGNOSIS — M47816 Spondylosis without myelopathy or radiculopathy, lumbar region: Secondary | ICD-10-CM | POA: Diagnosis not present

## 2018-07-15 DIAGNOSIS — I10 Essential (primary) hypertension: Secondary | ICD-10-CM | POA: Diagnosis not present

## 2018-07-15 DIAGNOSIS — M9983 Other biomechanical lesions of lumbar region: Secondary | ICD-10-CM | POA: Diagnosis not present

## 2018-07-15 DIAGNOSIS — M961 Postlaminectomy syndrome, not elsewhere classified: Secondary | ICD-10-CM | POA: Diagnosis not present

## 2018-07-16 DIAGNOSIS — I453 Trifascicular block: Secondary | ICD-10-CM | POA: Diagnosis not present

## 2018-07-16 DIAGNOSIS — I2510 Atherosclerotic heart disease of native coronary artery without angina pectoris: Secondary | ICD-10-CM | POA: Diagnosis not present

## 2018-07-16 DIAGNOSIS — I10 Essential (primary) hypertension: Secondary | ICD-10-CM | POA: Diagnosis not present

## 2018-07-16 DIAGNOSIS — R0609 Other forms of dyspnea: Secondary | ICD-10-CM | POA: Diagnosis not present

## 2018-07-18 IMAGING — XA DG MYELOGRAPHY LUMBAR INJ LUMBOSACRAL
7 of 17 series · 7 of 17 positions shown · non-contrast
Comparison: None

CLINICAL DATA: Lower extremity pain
TECHNIQUE: Contiguous axial images were obtained through the Lumbar spine after
the intrathecal infusion of infusion. Coronal and sagittal
reconstructions were obtained of the axial image sets.

[Series 1: w lumbar spine lat · 0.15mm/px · 1 of 1 slices shown]
[im 1/1]
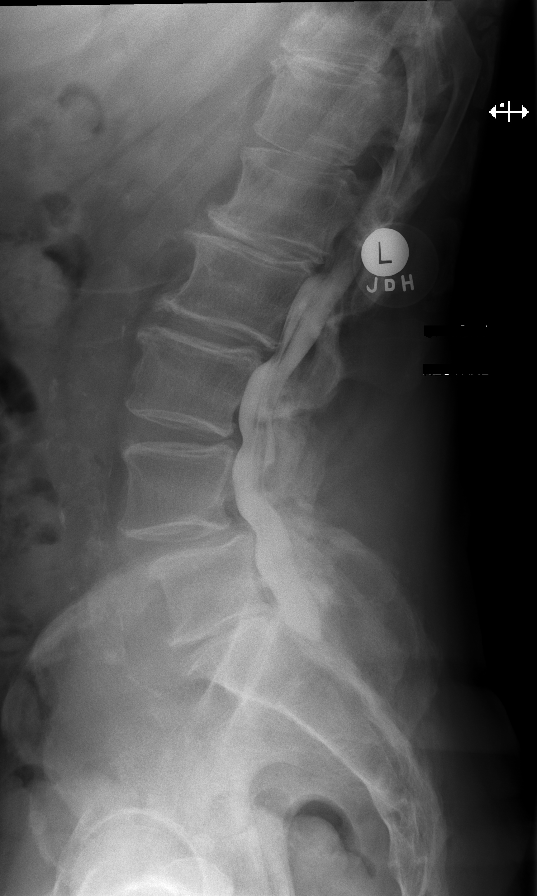

[Series 1: vasc adipose · 1 of 1 slices shown (1 of 4)]
[im 1/1]
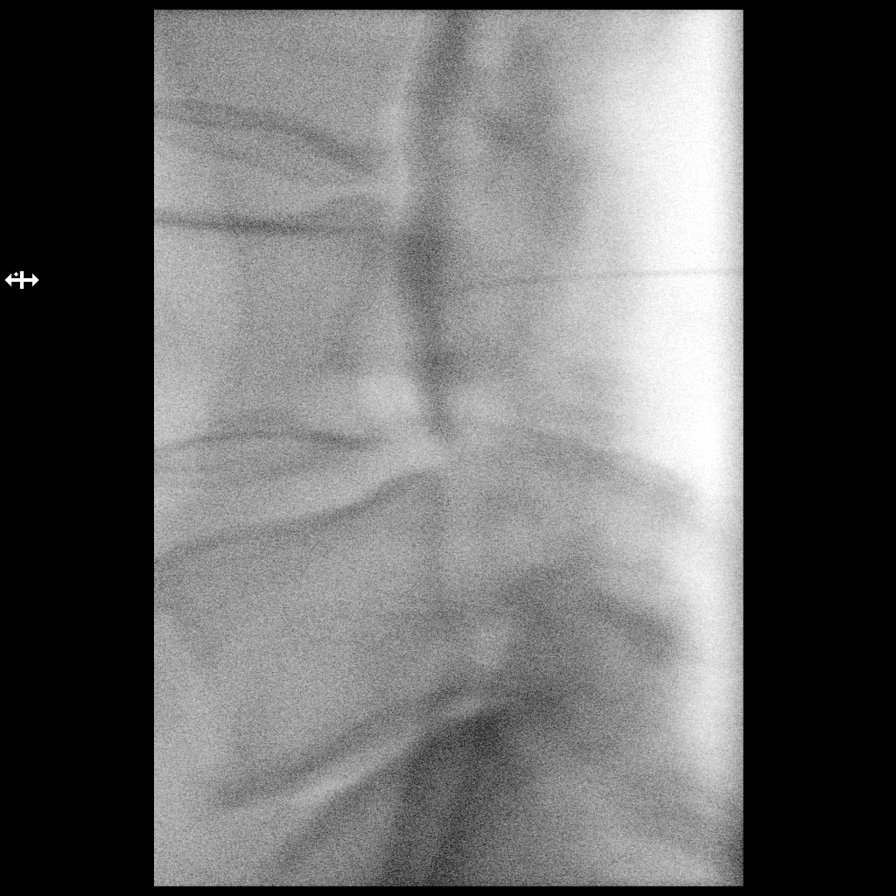

[Series 2: vasc adipose · 1 of 1 slices shown (2 of 4)]
[im 1/1]
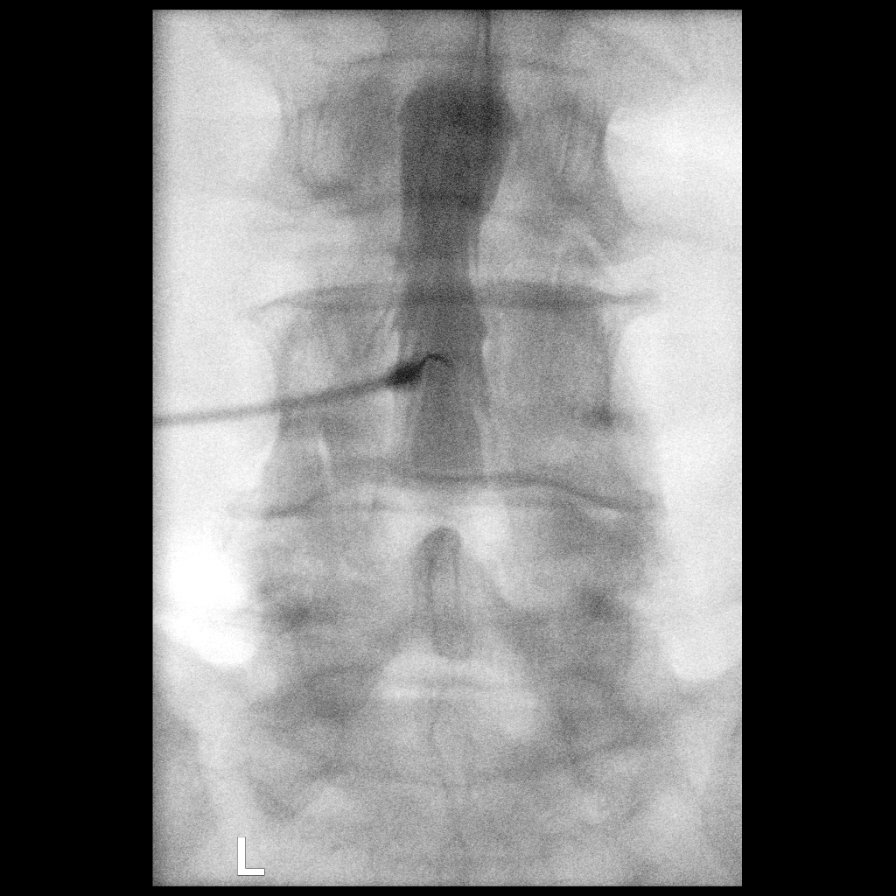

[Series 2: w lumbar spine flexion · 0.15mm/px · 1 of 1 slices shown]
[im 1/1]
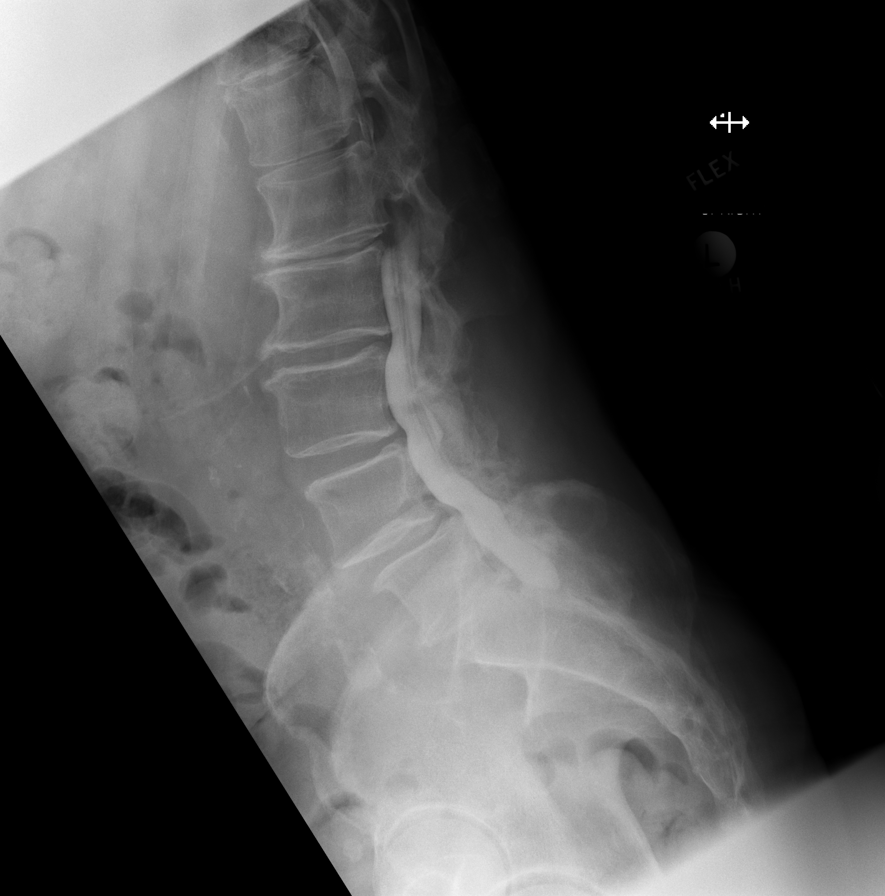

[Series 3: w lumbar spine extension · 0.15mm/px · 1 of 1 slices shown]
[im 1/1]
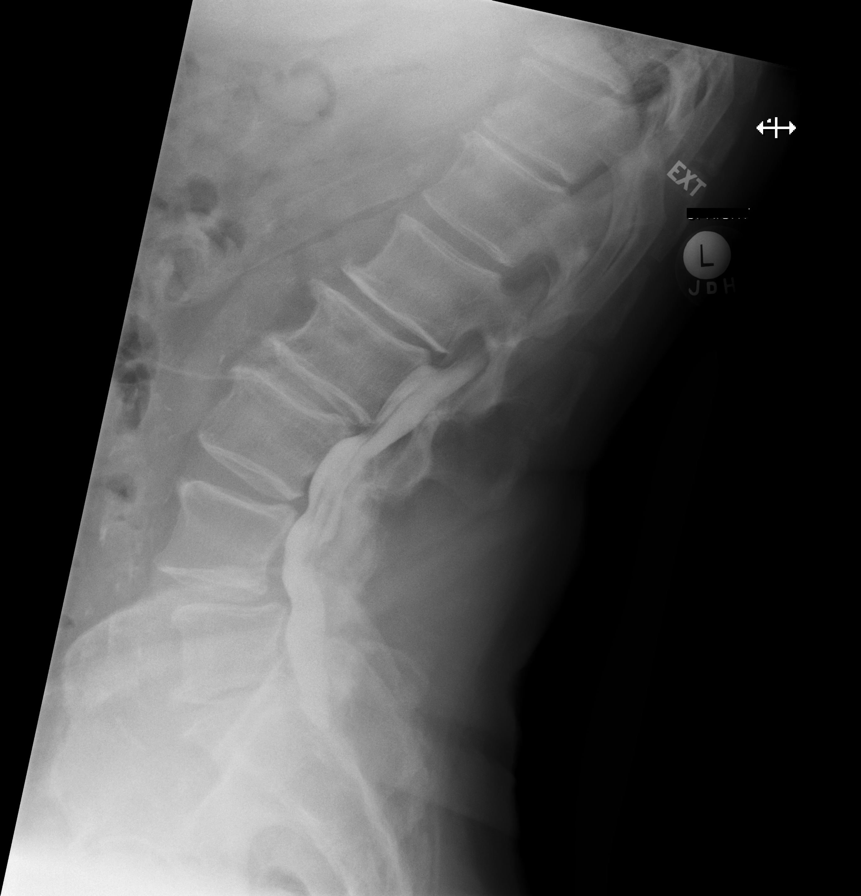

[Series 3: vasc adipose · 1 of 1 slices shown (3 of 4)]
[im 1/1]
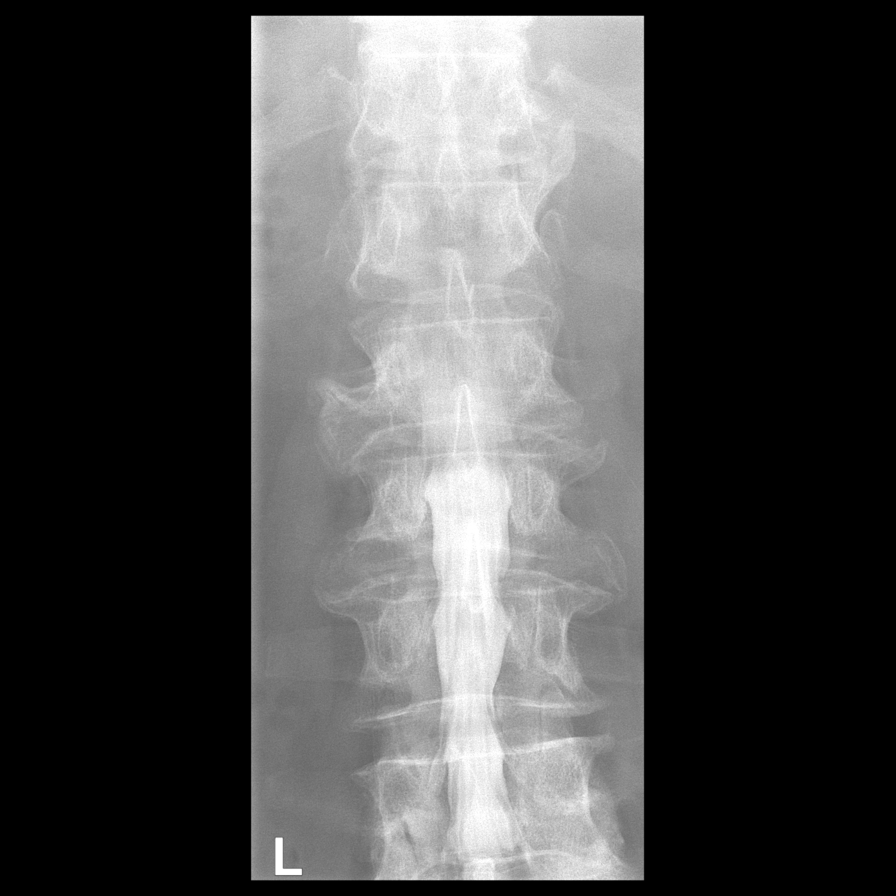

[Series 4: vasc adipose · 1 of 1 slices shown (4 of 4)]
[im 1/1]
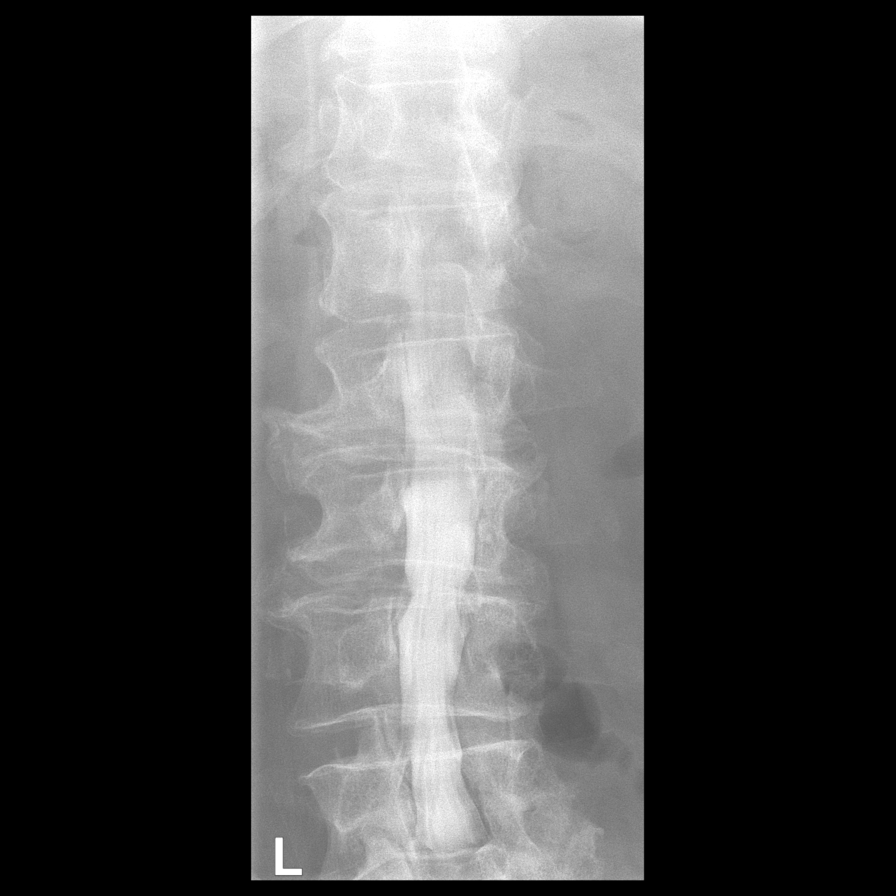

[7 of 17 positions shown; findings below may reference images not displayed]

EXAM:
LUMBAR MYELOGRAM

FLUOROSCOPY TIME:  48 seconds.

PROCEDURE:
After thorough discussion of risks and benefits of the procedure
including bleeding, infection, injury to nerves, blood vessels,
adjacent structures as well as headache and CSF leak, written and
oral informed consent was obtained. Consent was obtained by Dr.
Anab Pinky. Time out form was completed.

Patient was positioned prone on the fluoroscopy table. Local
anesthesia was provided with 1% lidocaine without epinephrine after
prepped and draped in the usual sterile fashion. Puncture was
performed at L3-4 using a 3 1/2 inch 22-gauge spinal needle via left
paramedian approach. Using a single pass through the dura, the
needle was placed within the thecal sac, with return of clear CSF.
15 mL of Isovue R-HII was injected into the thecal sac, with normal
opacification of the nerve roots and cauda equina consistent with
free flow within the subarachnoid space.

I personally performed the lumbar puncture and administered the
intrathecal contrast. I also personally supervised acquisition of
the myelogram images.
FINDINGS: There is 10 mm anterolisthesis L4 upon L5. Alignment is relatively
anatomic otherwise other than slight retrolisthesis L2 upon L3 and
L1 upon L2. After application of flexion and extension, there is no
evidence of abnormal motion to suggest instability. Alignment
remains stable throughout motion.

There is advanced narrowing of the L1-2 disc, moderate narrowing of
the L2-3 disc, moderate narrowing of the L3-4 disc, moderate
narrowing of the L4-5 disc, and severe narrowing of the L5-S1 disc.
No vertebral compression deformity. No evidence of significant
central stenosis within the lumbar spine.
IMPRESSION: Multilevel degenerative change as described. No evidence of
instability. CT myelogram to follow.

## 2018-07-20 DIAGNOSIS — M9983 Other biomechanical lesions of lumbar region: Secondary | ICD-10-CM | POA: Diagnosis not present

## 2018-07-21 DIAGNOSIS — M48061 Spinal stenosis, lumbar region without neurogenic claudication: Secondary | ICD-10-CM | POA: Diagnosis not present

## 2018-07-21 DIAGNOSIS — M5127 Other intervertebral disc displacement, lumbosacral region: Secondary | ICD-10-CM | POA: Diagnosis not present

## 2018-07-21 DIAGNOSIS — M961 Postlaminectomy syndrome, not elsewhere classified: Secondary | ICD-10-CM | POA: Diagnosis not present

## 2018-07-29 DIAGNOSIS — M9983 Other biomechanical lesions of lumbar region: Secondary | ICD-10-CM | POA: Diagnosis not present

## 2018-07-29 DIAGNOSIS — M47816 Spondylosis without myelopathy or radiculopathy, lumbar region: Secondary | ICD-10-CM | POA: Diagnosis not present

## 2018-07-29 DIAGNOSIS — I10 Essential (primary) hypertension: Secondary | ICD-10-CM | POA: Diagnosis not present

## 2018-07-29 DIAGNOSIS — M961 Postlaminectomy syndrome, not elsewhere classified: Secondary | ICD-10-CM | POA: Diagnosis not present

## 2018-07-30 ENCOUNTER — Encounter: Payer: Self-pay | Admitting: Diagnostic Neuroimaging

## 2018-07-30 ENCOUNTER — Ambulatory Visit (INDEPENDENT_AMBULATORY_CARE_PROVIDER_SITE_OTHER): Payer: Medicare Other | Admitting: Diagnostic Neuroimaging

## 2018-07-30 VITALS — BP 144/78 | HR 70 | Ht 73.0 in | Wt 248.0 lb

## 2018-07-30 DIAGNOSIS — M5416 Radiculopathy, lumbar region: Secondary | ICD-10-CM

## 2018-07-30 DIAGNOSIS — R269 Unspecified abnormalities of gait and mobility: Secondary | ICD-10-CM | POA: Diagnosis not present

## 2018-07-30 DIAGNOSIS — G629 Polyneuropathy, unspecified: Secondary | ICD-10-CM

## 2018-07-30 NOTE — Patient Instructions (Signed)
GAIT DIFFICULTY (due to lumbar degenerative spine disease + neuropathy) - check EMG/NCS (neuropathy eval) - check neuropathy labs - refer to PT evaluation; use cane at all times

## 2018-07-30 NOTE — Progress Notes (Signed)
[facility]  PATIENT: Cody Floyd DOB: 1941-08-07  REFERRING CLINICIAN: Einar Gip HISTORY FROM: patient  REASON FOR VISIT: new consult / existing patient   HISTORICAL  CHIEF COMPLAINT:  Chief Complaint  Patient presents with  . Gait Problem    rm 7, New Pt, "walking difficulty x 1 year, have a cane at home, haven't done any therapy, have fallen several times- no injuries"    HISTORY OF PRESENT ILLNESS:   UPDATE (07/30/18, VRP): Since last visit, doing 07/30/18. Symptoms are progressive. More gait and balance diff. More falls. Referred here for cerebellar ataxia eval per Dr. Einar Gip. Back issues being managed by Dr. Brien Few and Dr. Annette Stable now. Does not remember seeing Dr. Louanne Skye in the past (? Memory loss). Uses cane occ (but did not bring today).   PRIOR HPI (01/23/16): 77 year old male here for evaluation of balance and gait difficulty. October 2015 patient had lumbar spine surgery due to low back pain radiating to left leg. Symptoms slightly improved but then returned. In December 2015 patient felt increasing balance and gait difficulty. Symptoms seem to fluctuate and were intermittent. These fluctuation and balance difficulty continued until March 2017. At this time he had significant increased back pain especially with twisting movements. Patient having more balance and walking problems especially if he walks longer distances. Patient was also found to have elevated CK levels, saw a rheumatologist Dr. Dossie Der, who then referred patient to me for further evaluation of ataxia.  Patient denies any problems with his fingers, hands or neck. No headache or vision changes.   REVIEW OF SYSTEMS: Full 14 system review of systems performed and negative with exception of: snoring numbness weakness chills anxiety pain incontinence.   ALLERGIES: No Known Allergies  HOME MEDICATIONS: Outpatient Medications Prior to Visit  Medication Sig Dispense Refill  . amLODipine (NORVASC) 10 MG  tablet Take 10 mg by mouth daily.    [name] aspirin EC 81 MG tablet Take 81 mg by mouth daily.    [name] atorvastatin (LIPITOR) 10 MG tablet Take 10 mg by mouth daily.    . clopidogrel (PLAVIX) 75 MG tablet Take 1 tablet (75 mg total) by mouth daily with breakfast. 90 tablet 1  . gabapentin (NEURONTIN) 300 MG capsule 600 mg 3 (three) times daily.    . meclizine (ANTIVERT) 25 MG tablet Take 25 mg by mouth 3 (three) times daily as needed for dizziness.    [name] omeprazole (PRILOSEC) 20 MG capsule 20 mg daily.    . pramipexole (MIRAPEX) 0.5 MG tablet Take 0.5 mg by mouth at bedtime.     [name] lisinopril (PRINIVIL,ZESTRIL) 10 MG tablet Take 10 mg by mouth daily.    . nitroGLYCERIN (NITROSTAT) 0.4 MG SL tablet Place 0.4 mg under the tongue every 5 (five) minutes as needed for chest pain.    [name] propranolol (INNOPRAN XL) 120 MG 24 hr capsule Take 120 mg by mouth daily as needed (for BP and migraine).     . terazosin (HYTRIN) 5 MG capsule Take 5 mg by mouth at bedtime.     . pantoprazole (PROTONIX) 40 MG tablet Take 1 tablet (40 mg total) by mouth daily before breakfast. 30 tablet 1   No facility-administered medications prior to visit.     PAST MEDICAL HISTORY: Past Medical History:  Diagnosis Date  . Arthritis    "a little all over my body" (06/01/2018)  . Chronic lower back pain   . GERD (gastroesophageal reflux disease)   . History of BPH   .  Hyperlipidemia   . Hypertension   . RLS (restless legs syndrome)   . Ventral hernia   . Vertigo     PAST SURGICAL HISTORY: Past Surgical History:  Procedure Laterality Date  . BACK SURGERY    . CORONARY ANGIOPLASTY WITH STENT PLACEMENT  06/01/2018  . CORONARY STENT INTERVENTION N/A 06/01/2018   Procedure: CORONARY STENT INTERVENTION;  Surgeon: Adrian Prows, MD;  Location: [facility];  Service: Cardiovascular;  Laterality: N/A;  . HAMMER TOE SURGERY Left 2012  . JOINT REPLACEMENT    . LEFT HEART CATH AND CORONARY ANGIOGRAPHY N/A 06/01/2018   Procedure: LEFT  HEART CATH AND CORONARY ANGIOGRAPHY;  Surgeon: Adrian Prows, MD;  Location: [facility];  Service: Cardiovascular;  Laterality: N/A;  . LUMBAR [place]     "I had 2 other lower back ORs before the one in 2015"  . LUMBAR LAMINECTOMY/DECOMPRESSION MICRODISCECTOMY N/A 05/29/2014   Procedure: CENTRAL LUMBAR LAMINECTOMY L3-4 and L4-5;  Surgeon: Jessy Oto, MD;  Location: [facility];  Service: Orthopedics;  Laterality: N/A;  . TOTAL KNEE ARTHROPLASTY Left 03/31/2017   Procedure: LEFT TOTAL KNEE ARTHROPLASTY;  Surgeon: Meredith Pel, MD;  Location: [facility];  Service: Orthopedics;  Laterality: Left;    FAMILY HISTORY: Family History  Problem Relation Age of Onset  . Osteoarthritis Mother     SOCIAL HISTORY:  Social History   Socioeconomic History  . Marital status: Divorced    Spouse name: Not on file  . Number of children: 0  . Years of education: 8  . 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 Financial resource strain: Not on file  . Food insecurity:    Worry: Not on file    Inability: Not on file  . Transportation needs:    Medical: Not on file    Non-medical: Not on file  Tobacco Use  . Smoking status: Never Smoker  . Smokeless tobacco: Never Used  Substance and Sexual Activity  . Alcohol use: Not Currently    Comment: 06/01/2018  "probably have 3 beers/month"  . Drug use: Never  . Sexual activity: Not Currently  Lifestyle  . Physical activity:    Days per week: Not on file    Minutes per session: Not on file  . Stress: Not on file  Relationships  . Social connections:    Talks on phone: Not on file    Gets together: Not on file    Attends religious service: Not on file    Active member of club or organization: Not on file    Attends meetings of clubs or organizations: Not on file    Relationship status: Not on file  . Intimate partner violence:    Fear of current or ex  partner: Not on file    Emotionally abused: Not on file    Physically abused: Not on file    Forced sexual activity: Not on file  Other Topics Concern  . Not on file  Social History Narrative   Lives with sig other   Caffeine use- coffee all day     PHYSICAL EXAM  GENERAL EXAM/CONSTITUTIONAL: Vitals:  Vitals:   07/30/18 0814  BP: (!) 144/78  Pulse: 70  Weight: 248 lb (112.5 kg)  Height: _0  (1.854 m)   Body mass index is 32.72 kg/m. No exam data present  Patient is in no distress; well developed,  nourished; SLIGHTLY DISHEVELED; neck is supple  CARDIOVASCULAR:  Examination of carotid arteries is normal; no carotid bruits  Regular rate and rhythm, no murmurs  Examination of peripheral vascular system by observation and palpation is normal  EYES:  Ophthalmoscopic exam of optic discs and posterior segments is normal; no papilledema or hemorrhages  MUSCULOSKELETAL:  Gait, strength, tone, movements noted in Neurologic exam below  NEUROLOGIC: MENTAL STATUS:  No flowsheet data found.  awake, alert, oriented to person, place and time  recent and remote memory intact  normal attention and concentration  language fluent, comprehension intact, naming intact,   fund of knowledge appropriate  CRANIAL NERVE:   2nd - no papilledema on fundoscopic exam  2nd, 3rd, 4th, 6th - pupils equal and reactive to light, visual fields full to confrontation, extraocular muscles intact, no nystagmus  5th - facial sensation symmetric  7th - facial strength symmetric  8th - hearing intact  9th - palate elevates symmetrically, uvula midline  11th - shoulder shrug symmetric  12th - tongue protrusion midline  MILD HOARSE VOICE  MOTOR:   NO RIGIDITY; MILD BRADYKINESIA (? RELATED TO ARTHRITIS)  normal bulk and tone, full strength in the BUE, BLE; EXCEPT RIGHT DF 4, LEFT DF 4+  SENSORY:   normal and symmetric to light touch, temperature, vibration; ABSENT PP IN FEET  AND LEFT HAND; ABSENT VIB IN FEET  COORDINATION:   finger-nose-finger, fine finger movements --> slow  REFLEXES:   deep tendon reflexes TRACE and symmetric; ABSENT AT ANKLES  GAIT/STATION:   UNSTEADY GAIT; VERY UNSTEADY WITH STANDING WITH FEET TOGETHER AND EYES OPEN; STEPPAGE GAIT WITH RIGHT FOOT DROP > LEFT; SLOW TO RISE; ANTALGIC GAIT    DIAGNOSTIC DATA (LABS, IMAGING, TESTING) - I reviewed patient records, labs, notes, testing and imaging myself where available.  Lab Results  Component Value Date   WBC 7.1 06/02/2018   HGB 12.6 (L) 06/02/2018   HCT 39.5 06/02/2018   MCV 86.1 06/02/2018   PLT 144 (L) 06/02/2018      Component Value Date/Time   NA 139 06/02/2018 0213   K 3.7 06/02/2018 0213   CL 107 06/02/2018 0213   CO2 23 06/02/2018 0213   GLUCOSE 116 (H) 06/02/2018 0213   BUN 17 06/02/2018 0213   CREATININE 1.05 06/02/2018 0213   CALCIUM 8.8 (L) 06/02/2018 0213   PROT 7.3 05/24/2014 1236   ALBUMIN 4.2 05/24/2014 1236   AST 29 05/24/2014 1236   ALT 32 05/24/2014 1236   ALKPHOS 73 05/24/2014 1236   BILITOT 0.8 05/24/2014 1236   GFRNONAA >60 06/02/2018 0213   GFRAA >60 06/02/2018 0213   No results found for: CHOL, HDL, LDLCALC, LDLDIRECT, TRIG, CHOLHDL Lab Results  Component Value Date   HGBA1C 6.1 (H) 01/23/2016   Lab Results  Component Value Date   OMVEHMCN47 096 01/23/2016   Lab Results  Component Value Date   TSH 1.760 01/23/2016    05/24/14 EKG [I reviewed images myself and agree with interpretation. -VRP]  - sinus bradycardia with 1st degree AV block  04/21/14 MRI lumbar spine [I reviewed images myself and agree with interpretation. -VRP]  1. Moderate to severe multifactorial spinal stenosis at L3-4 with mild lateral recess stenosis bilaterally.  2. Severe multifactorial spinal stenosis at L4-5. There is significant lateral recess stenosis with mild right greater than left foraminal stenosis. 3. Postsurgical changes on the right at L5-S1 with  chronic disc degeneration and osteophytes contributing to mild biforaminal stenosis, but no definite nerve root  encroachment.  01/14/16 CT lumbar spine [I reviewed images myself and agree with interpretation. -VRP]  - L1-2 multifactorial mild spinal stenosis. - L2-3 multifactorial mild spinal stenosis and mild bilateral lateral recess stenosis (greater on left). - L3-4 prior posterior decompression. Multifactorial right greater than left foraminal narrowing and slight encroachment upon the exiting right L3 nerve root. Multifactorial moderate canal narrowing most notable just above the disc space. - L4-5 prior posterior decompression. Prominent facet degenerative changes and bony overgrowth. 7 mm anterior slip L4. Multifactorial moderate to marked bilateral foraminal narrowing and encroachment upon the exiting L4 nerve roots. No significant canal narrowing although evaluation limited by postop changes. - L5-S1 prior right hemilaminectomy. Multifactorial mild to moderate bilateral foraminal narrowing. Central spur with mild bilateral lateral recess stenosis and minimal indentation ventral thecal sac. - Calcified ectatic/dilated aorta and iliac arteries incompletely assessed on present exam.  01/31/16 MRI lumbar spine [I reviewed images myself and agree with interpretation. -VRP]  1. At L3-4: posterior decompression, disc bulging, facet hypertrophy, ligamentum flavum hypertrophy, with severe right and moderate left foraminal stenosis 2. At L4-5: posterior decompression, disc bulging, facet hypertrophy, ligamentum flavum hypertrophy, with severe biforaminal stenosis 3. At L5-S1: right laminectomy, bone spurring, facet hypertrophy with moderate-severe biforaminal stenosis and right greater than left lateral recess stenoses 4. Enhancing cystic lesions in the right L3 pedicle and right L4-5 facet joint, may represent synovial cysts. These are stable from CT lumbar spine on 01/14/16, but not seen on MRI from  04/21/14. 5. Compared to MRI on 04/21/14, there has been interval lumbar decompression surgery.      ASSESSMENT AND PLAN  77 y.o. year old male here with history of lumbar spinal stenosis, status post decompression surgery (2015, Dr. Louanne Skye), left knee replacement (Aug 2018, Dr. Marlou Sa) now with increasing balance difficulty. Most likely these are related to progression of lumbar spinal degenerative disease and radiculopathies (currently being managed by Dr. Annette Stable and Dr. Brien Few). Also with evidence of neuropathy or other posterior column dysfunction (decr proprioception). No evidence of cerebellar ataxia at this time.   Ddx of balance difficulty: lumbar radiculopathy +/- neuropathy  1. Gait difficulty   2. Neuropathy   3. Lumbar radiculopathy      PLAN:  GAIT DIFFICULTY (due to lumbar degenerative spine disease + neuropathy) - check EMG/NCS (neuropathy eval) - check neuropathy labs - refer to PT evaluation; use cane at all times  Orders Placed This Encounter  Procedures  . Vitamin B12  . Hemoglobin A1c  . Multiple Myeloma Panel (SPEP&IFE w/QIG)  . TSH  . Ambulatory referral to Physical Therapy  . NCV with EMG(electromyography)   Return for for NCV/EMG.    Penni Bombard, MD 86/02/5448, 2:01 AM Certified in Neurology, Neurophysiology and Neuroimaging  [facility] [address] [place], [place] [address] [phone]

## 2018-08-04 ENCOUNTER — Encounter: Payer: Self-pay | Admitting: *Deleted

## 2018-08-04 ENCOUNTER — Telehealth: Payer: Self-pay | Admitting: *Deleted

## 2018-08-04 NOTE — Telephone Encounter (Signed)
Sent my chart message:  Your TSH (thyroid), Vitamin B12, and Hgb A1C labs came back normal. One lab is still pending.  We will let you know when those other results are back.

## 2018-08-05 LAB — HEMOGLOBIN A1C
ESTIMATED AVERAGE GLUCOSE: 114 mg/dL
Hgb A1c MFr Bld: 5.6 % (ref 4.8–5.6)

## 2018-08-05 LAB — MULTIPLE MYELOMA PANEL, SERUM
ALBUMIN SERPL ELPH-MCNC: 4.1 g/dL (ref 2.9–4.4)
ALPHA 1: 0.2 g/dL (ref 0.0–0.4)
ALPHA2 GLOB SERPL ELPH-MCNC: 0.7 g/dL (ref 0.4–1.0)
Albumin/Glob SerPl: 1.7 (ref 0.7–1.7)
B-Globulin SerPl Elph-Mcnc: 1 g/dL (ref 0.7–1.3)
Gamma Glob SerPl Elph-Mcnc: 0.6 g/dL (ref 0.4–1.8)
Globulin, Total: 2.5 g/dL (ref 2.2–3.9)
IGA/IMMUNOGLOBULIN A, SERUM: 244 mg/dL (ref 61–437)
IGG (IMMUNOGLOBIN G), SERUM: 825 mg/dL (ref 700–1600)
IgM (Immunoglobulin M), Srm: 19 mg/dL (ref 15–143)
TOTAL PROTEIN: 6.6 g/dL (ref 6.0–8.5)

## 2018-08-05 LAB — TSH: TSH: 2.09 u[IU]/mL (ref 0.450–4.500)

## 2018-08-05 LAB — VITAMIN B12: Vitamin B-12: 310 pg/mL (ref 232–1245)

## 2018-08-11 ENCOUNTER — Telehealth: Payer: Self-pay | Admitting: *Deleted

## 2018-08-11 NOTE — Telephone Encounter (Signed)
-----   Message from Penni Bombard, MD sent at 08/06/2018  4:08 PM EST ----- Normal labs. Please call patient. -VRP

## 2018-08-11 NOTE — Telephone Encounter (Signed)
Spoke to pt and relayed that his labs were normal.  He verbalized understanding.

## 2018-09-02 ENCOUNTER — Encounter: Payer: Medicare Other | Admitting: Diagnostic Neuroimaging

## 2018-09-02 ENCOUNTER — Ambulatory Visit (INDEPENDENT_AMBULATORY_CARE_PROVIDER_SITE_OTHER): Payer: Medicare Other | Admitting: Diagnostic Neuroimaging

## 2018-09-02 DIAGNOSIS — G629 Polyneuropathy, unspecified: Secondary | ICD-10-CM | POA: Diagnosis not present

## 2018-09-02 DIAGNOSIS — M5416 Radiculopathy, lumbar region: Secondary | ICD-10-CM

## 2018-09-02 DIAGNOSIS — Z0289 Encounter for other administrative examinations: Secondary | ICD-10-CM

## 2018-09-02 DIAGNOSIS — R269 Unspecified abnormalities of gait and mobility: Secondary | ICD-10-CM

## 2018-09-03 NOTE — Procedures (Signed)
[facility]  NCS (NERVE CONDUCTION STUDY) WITH EMG (ELECTROMYOGRAPHY) REPORT   STUDY DATE: 09/02/18 PATIENT NAME: Cody Floyd DOB: 11-15-1940 MRN: [id number]  ORDERING CLINICIAN: Andrey Spearman, MD   TECHNOLOGIST: Sherre Scarlet ELECTROMYOGRAPHER: Earlean Polka. , MD  CLINICAL INFORMATION: 78 year old male with lower extremity numbness, weakness, gait and balance difficulty.   FINDINGS: NERVE CONDUCTION STUDY:  Right median motor response is prolonged this latency, decreased amplitude, slow conduction velocity (45 m/s).  Right ulnar motor responses normal.  Left peroneal motor sponsors normal distal latency, decreased amplitude, normal conduction velocity.  Bilateral tibial and right peroneal motor responses could not be obtained.  Right median sensory sponsors prolonged peak latency and decreased amplitude.  Right ulnar sensory spots has normal peak latency and decreased amplitude.  Bilateral sural and superficial peroneal sensory sponsors could not be obtained.  Right ulnar F-wave latency is prolonged.   NEEDLE ELECTROMYOGRAPHY:  Right tibialis anterior has rare positive sharp waves at rest and decreased recruitment of large motor units on exertion.   IMPRESSION:   Abnormal study demonstrating: - Severe length dependent axonal sensorimotor polyneuropathy. - Superimposed right median neuropathy at the wrist consistent with right carpal tunnel syndrome.    INTERPRETING PHYSICIAN:  Penni Bombard, MD Certified in Neurology, Neurophysiology and Neuroimaging  [facility] [address] [place], [place] [address] [phone]   [facility]    Nerve / Sites Muscle Latency Ref. Amplitude Ref. Rel Amp Segments Distance Velocity Ref. Area    ms ms mV mV %  cm m/s m/s mVms  R Median - APB     Wrist APB 6.4 ?4.4 2.9 ?4.0 100 Wrist - APB 7   9.9     Upper arm APB 11.5  2.7  92.1 Upper arm - Wrist 23 45 ?49 9.6  R Ulnar  - ADM     Wrist ADM 3.2 ?3.3 10.2 ?6.0 100 Wrist - ADM 7   26.1     B.Elbow ADM 7.5  9.1  88.5 B.Elbow - Wrist 21 49 ?49 25.2     A.Elbow ADM 9.5  8.4  93.2 A.Elbow - B.Elbow 10 49 ?49 25.8         A.Elbow - Wrist      R Peroneal - EDB     Ankle EDB NR ?6.5 NR ?2.0 NR Ankle - EDB 9   NR     Fib head EDB NR  NR  NR Fib head - Ankle 33 NR ?44 NR  L Peroneal - EDB     Ankle EDB 6.1 ?6.5 0.7 ?2.0 100 Ankle - EDB 9   1.6     Fib head EDB 14.6  0.6  87.2 Fib head - Ankle 38 44 ?44 1.4     Pop fossa EDB 16.5  0.6  99 Pop fossa - Fib head 8 44 ?44 1.4         Pop fossa - Ankle      R Tibial - AH     Ankle AH NR ?5.8 NR ?4.0 NR Ankle - AH 9   NR     Pop fossa AH NR  NR  NR Pop fossa - Ankle 41 NR ?41 NR  L Tibial - AH     Ankle AH NR ?5.8 NR ?4.0 NR Ankle - AH 9   NR     Pop fossa AH NR  NR  NR Pop fossa - Ankle 39 NR ?41 NR  [place]    Nerve / Sites Rec. Site Peak Lat Ref.  Amp Ref. Segments Distance    ms ms V V  cm  R Sural - Ankle (Calf)     Calf Ankle NR ?4.4 NR ?6 Calf - Ankle 14  L Sural - Ankle (Calf)     Calf Ankle NR ?4.4 NR ?6 Calf - Ankle 14  R Superficial peroneal - Ankle     Lat leg Ankle NR ?4.4 NR ?6 Lat leg - Ankle 14  L Superficial peroneal - Ankle     Lat leg Ankle NR ?4.4 NR ?6 Lat leg - Ankle 14  R Median - Orthodromic (Dig II, Mid palm)     Dig II Wrist 5.2 ?3.4 2 ?10 Dig II - Wrist 13  R Ulnar - Orthodromic, (Dig V, Mid palm)     Dig V Wrist 2.8 ?3.1 2 ?5 Dig V - Wrist 56                 F  Wave    Nerve F Lat Ref.   ms ms  R Ulnar - ADM 33.7 ?32.0        EMG full       EMG Summary Table    Spontaneous MUAP Recruitment  Muscle IA Fib PSW Fasc Other Amp Dur. Poly Pattern  R. Tibialis anterior Normal None rare Rare _______ Increased Normal Normal Reduced  R. Gastrocnemius (Medial head) Normal None None None _______ Normal Normal Normal Normal  R. vastus medialis Normal None None None _______ Normal Normal Normal Normal

## 2018-10-12 ENCOUNTER — Other Ambulatory Visit: Payer: Self-pay | Admitting: Cardiology

## 2018-10-13 ENCOUNTER — Encounter (INDEPENDENT_AMBULATORY_CARE_PROVIDER_SITE_OTHER): Payer: Self-pay | Admitting: Orthopedic Surgery

## 2018-10-13 ENCOUNTER — Ambulatory Visit (INDEPENDENT_AMBULATORY_CARE_PROVIDER_SITE_OTHER): Payer: Medicare Other | Admitting: Orthopedic Surgery

## 2018-10-13 DIAGNOSIS — M1711 Unilateral primary osteoarthritis, right knee: Secondary | ICD-10-CM

## 2018-10-16 ENCOUNTER — Encounter (INDEPENDENT_AMBULATORY_CARE_PROVIDER_SITE_OTHER): Payer: Self-pay | Admitting: Orthopedic Surgery

## 2018-10-16 DIAGNOSIS — M1711 Unilateral primary osteoarthritis, right knee: Secondary | ICD-10-CM

## 2018-10-16 MED ORDER — LIDOCAINE HCL 1 % IJ SOLN
5.0000 mL | INTRAMUSCULAR | Status: AC | PRN
  Administered 2018-10-16: 5 mL

## 2018-10-16 MED ORDER — BUPIVACAINE HCL 0.25 % IJ SOLN
4.0000 mL | INTRAMUSCULAR | Status: AC | PRN
  Administered 2018-10-16: 4 mL via INTRA_ARTICULAR

## 2018-10-16 MED ORDER — METHYLPREDNISOLONE ACETATE 40 MG/ML IJ SUSP
40.0000 mg | INTRAMUSCULAR | Status: AC | PRN
  Administered 2018-10-16: 40 mg via INTRA_ARTICULAR

## 2018-10-16 NOTE — Progress Notes (Signed)
Office Visit Note   Patient: Cody Floyd           Date of Birth: Jul 28, 1941           MRN: [id number] Visit Date: 10/13/2018 Requested by: Jani Gravel, MD [place] [address] [place], [place] [address] PCP: Jani Gravel, MD  Subjective: Chief Complaint  Patient presents with  . Right Knee - Pain    HPI: Zackari is a patient with right knee pain and known right knee arthritis.  He is done well with his left total knee replacement.  He has medial compartment arthritis in the right knee.  States he has pain which is worse at night.  He does have a stent in his heart.  He reports weakness and giving way in that right leg and knee along with swelling in his legs.  He is 78 years old and does not use any ambulatory assistance devices              ROS: All systems reviewed are negative as they relate to the chief complaint within the history of present illness.  Patient denies  fevers or chills.   Assessment & Plan: Visit Diagnoses:  1. Unilateral primary osteoarthritis, right knee     Plan: Impression is right knee pain with medial compartment arthritis.  Plan is injection into the right knee with consideration of surgical intervention in about 2 months.  We will see how he does with the injection and if he wants to get that knee replaced we could do it.  He does have some risk factors for surgery.  We will see him back in 2 months and decide at that time.  Currently he is doing well with his left total knee replacement.  Follow-Up Instructions: Return in about 8 weeks (around 12/08/2018).   Orders:  No orders of the defined types were placed in this encounter.  No orders of the defined types were placed in this encounter.     Procedures: Large Joint Inj: R knee on 10/16/2018 5:43 PM Indications: diagnostic evaluation, joint swelling and pain Details: 18 G 1.5 in needle, superolateral approach  Arthrogram: No  Medications: 5 mL lidocaine 1 %; 40 mg methylPREDNISolone acetate  40 MG/ML; 4 mL bupivacaine 0.25 % Outcome: tolerated well, no immediate complications Procedure, treatment alternatives, risks and benefits explained, specific risks discussed. Consent was given by the patient. Immediately prior to procedure a time out was called to verify the correct patient, procedure, equipment, support staff and site/side marked as required. Patient was prepped and draped in the usual sterile fashion.       Clinical Data: No additional findings.  Objective: Vital Signs: There were no vitals taken for this visit.  Physical Exam:   Constitutional: Patient appears well-developed HEENT:  Head: Normocephalic Eyes:EOM are normal Neck: Normal range of motion Cardiovascular: Normal rate Pulmonary/chest: Effort normal Neurologic: Patient is alert Skin: Skin is warm Psychiatric: Patient has normal mood and affect    Ortho Exam: Ortho exam demonstrates full active and passive range of motion of the hips and ankles.  The left knee has range of motion to about 110 of flexion and full extension.  Right knee has about a 5 degree flexion contracture and flexion past 90.  Has medial joint line tenderness but no effusion.  Pedal pulse palpable bilaterally and trace edema present in both legs.  No other masses lymphadenopathy or skin changes noted in the right knee region  Specialty Comments:  No  specialty comments available.  Imaging: No results found.   PMFS History: Patient Active Problem List   Diagnosis Date Noted  . Post PTCA 06/01/2018  . Angina pectoris ([place]) 05/31/2018  . Arthritis of knee 03/31/2017  . Spinal stenosis, lumbar region, with neurogenic claudication 05/29/2014    Class: Chronic  . Left lumbar radiculopathy 05/29/2014    Class: Chronic   Past Medical History:  Diagnosis Date  . Arthritis    "a little all over my body" (06/01/2018)  . Chronic lower back pain   . GERD (gastroesophageal reflux disease)   . History of BPH   . Hyperlipidemia     . Hypertension   . RLS (restless legs syndrome)   . Ventral hernia   . Vertigo     Family History  Problem Relation Age of Onset  . Osteoarthritis Mother     Past Surgical History:  Procedure Laterality Date  . BACK SURGERY    . CORONARY ANGIOPLASTY WITH STENT PLACEMENT  06/01/2018  . CORONARY STENT INTERVENTION N/A 06/01/2018   Procedure: CORONARY STENT INTERVENTION;  Surgeon: Adrian Prows, MD;  Location: [facility];  Service: Cardiovascular;  Laterality: N/A;  . HAMMER TOE SURGERY Left 2012  . JOINT REPLACEMENT    . LEFT HEART CATH AND CORONARY ANGIOGRAPHY N/A 06/01/2018   Procedure: LEFT HEART CATH AND CORONARY ANGIOGRAPHY;  Surgeon: Adrian Prows, MD;  Location: [facility];  Service: Cardiovascular;  Laterality: N/A;  . LUMBAR [place]     "I had 2 other lower back ORs before the one in 2015"  . LUMBAR LAMINECTOMY/DECOMPRESSION MICRODISCECTOMY N/A 05/29/2014   Procedure: CENTRAL LUMBAR LAMINECTOMY L3-4 and L4-5;  Surgeon: Jessy Oto, MD;  Location: [facility];  Service: Orthopedics;  Laterality: N/A;  . 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 Smoking status: Never Smoker  . Smokeless tobacco: Never Used  Substance and Sexual Activity  . Alcohol use: Not Currently    Comment: 06/01/2018  "probably have 3 beers/month"  . Drug use: Never  . Sexual activity: Not Currently

## 2018-10-28 ENCOUNTER — Telehealth: Payer: Self-pay | Admitting: Cardiology

## 2018-10-28 NOTE — Telephone Encounter (Signed)
Patient has severe back pain and also what appears to be compressive neuropathy, has been scheduled for trans-foraminal bilateral steroid injection, hence Dr. Tama Headings would like to stop Plavix.  I discussed with the patient, advised him that as he has been on aspirin and Plavix for 6 months, since his elective angioplasty, he can stop his Plavix permanently, but need to continue aspirin indefinitely.  Patient's lifestyle has been severely limited, he is unable to walk, hence advised him to hold off on aspirin and have the steroid injection following which I would advise him to restart aspirin the same day of the procedure.  He is aware of a small risk of stent thrombosis.  This letter serves as a clearance from cardiac standpoint and I will forward this note to Dr. Tama Headings.

## 2018-11-13 ENCOUNTER — Other Ambulatory Visit: Payer: Self-pay | Admitting: Cardiology

## 2018-11-30 ENCOUNTER — Other Ambulatory Visit: Payer: Self-pay

## 2018-11-30 MED ORDER — CLOPIDOGREL BISULFATE 75 MG PO TABS: 75.0000 mg | ORAL_TABLET | Freq: Every day | ORAL | 1 refills | Status: DC

## 2018-12-15 ENCOUNTER — Other Ambulatory Visit: Payer: Self-pay | Admitting: Cardiology

## 2018-12-15 DIAGNOSIS — R0989 Other specified symptoms and signs involving the circulatory and respiratory systems: Secondary | ICD-10-CM

## 2018-12-27 ENCOUNTER — Ambulatory Visit: Payer: Medicare Other | Admitting: Podiatry

## 2018-12-27 ENCOUNTER — Ambulatory Visit (INDEPENDENT_AMBULATORY_CARE_PROVIDER_SITE_OTHER): Payer: Medicare Other

## 2018-12-27 ENCOUNTER — Encounter: Payer: Self-pay | Admitting: Podiatry

## 2018-12-27 ENCOUNTER — Other Ambulatory Visit: Payer: Self-pay

## 2018-12-27 VITALS — Temp 97.5°F

## 2018-12-27 DIAGNOSIS — M7989 Other specified soft tissue disorders: Secondary | ICD-10-CM

## 2018-12-27 DIAGNOSIS — M2042 Other hammer toe(s) (acquired), left foot: Secondary | ICD-10-CM

## 2018-12-27 DIAGNOSIS — L97521 Non-pressure chronic ulcer of other part of left foot limited to breakdown of skin: Secondary | ICD-10-CM | POA: Diagnosis not present

## 2018-12-27 MED ORDER — MUPIROCIN 2 % EX OINT: 1.0000 "application " | TOPICAL_OINTMENT | Freq: Two times a day (BID) | CUTANEOUS | 2 refills | Status: DC

## 2018-12-27 MED ORDER — CEPHALEXIN 500 MG PO CAPS: 500.0000 mg | ORAL_CAPSULE | Freq: Three times a day (TID) | ORAL | 0 refills | Status: DC

## 2018-12-27 NOTE — Progress Notes (Signed)
Subjective:   Patient ID: Cody Floyd, male   DOB: 78 y.o.   MRN: [id number]   HPI 78 year old male presents the office today for concerns of left second toe pain.  States this is been ongoing for last 2 months.  He states that he initially had a lot of swelling to the second toe.  He does try to trim off some dead skin himself he notices some odor coming from the toe.  He states there may been some drainage one-point but does not recall if there was any pus.  No red streaks.  No recent injury.  No recent treatment otherwise.   Review of Systems  All other systems reviewed and are negative.  Past Medical History:  Diagnosis Date  . Arthritis    "a little all over my body" (06/01/2018)  . Chronic lower back pain   . GERD (gastroesophageal reflux disease)   . History of BPH   . Hyperlipidemia   . Hypertension   . RLS (restless legs syndrome)   . Ventral hernia   . Vertigo     Past Surgical History:  Procedure Laterality Date  . BACK SURGERY    . CORONARY ANGIOPLASTY WITH STENT PLACEMENT  06/01/2018  . CORONARY STENT INTERVENTION N/A 06/01/2018   Procedure: CORONARY STENT INTERVENTION;  Surgeon: Adrian Prows, MD;  Location: [facility];  Service: Cardiovascular;  Laterality: N/A;  . HAMMER TOE SURGERY Left 2012  . JOINT REPLACEMENT    . LEFT HEART CATH AND CORONARY ANGIOGRAPHY N/A 06/01/2018   Procedure: LEFT HEART CATH AND CORONARY ANGIOGRAPHY;  Surgeon: Adrian Prows, MD;  Location: [facility];  Service: Cardiovascular;  Laterality: N/A;  . LUMBAR [place]     "I had 2 other lower back ORs before the one in 2015"  . LUMBAR LAMINECTOMY/DECOMPRESSION MICRODISCECTOMY N/A 05/29/2014   Procedure: CENTRAL LUMBAR LAMINECTOMY L3-4 and L4-5;  Surgeon: Jessy Oto, MD;  Location: [facility];  Service: Orthopedics;  Laterality: N/A;  . TOTAL KNEE ARTHROPLASTY Left 03/31/2017   Procedure: LEFT TOTAL KNEE ARTHROPLASTY;  Surgeon: Meredith Pel, MD;  Location: [facility];  Service:  Orthopedics;  Laterality: Left;     Current Outpatient Medications:  .  amLODipine (NORVASC) 10 MG tablet, Take 10 mg by mouth daily., Disp: , Rfl:  .  aspirin EC 81 MG tablet, Take 81 mg by mouth daily., Disp: , Rfl:  .  atorvastatin (LIPITOR) 10 MG tablet, Take 10 mg by mouth daily., Disp: , Rfl:  .  atorvastatin (LIPITOR) 20 MG tablet, TAKE 1 TABLET BY MOUTH DAILY, Disp: 30 tablet, Rfl: 3 .  cephALEXin (KEFLEX) 500 MG capsule, Take 1 capsule (500 mg total) by mouth 3 (three) times daily., Disp: 30 capsule, Rfl: 0 .  clopidogrel (PLAVIX) 75 MG tablet, Take 1 tablet (75 mg total) by mouth daily with breakfast., Disp: 90 tablet, Rfl: 1 .  gabapentin (NEURONTIN) 300 MG capsule, 600 mg 3 (three) times daily., Disp: , Rfl:  .  lisinopril (PRINIVIL,ZESTRIL) 10 MG tablet, Take 10 mg by mouth daily., Disp: , Rfl:  .  meclizine (ANTIVERT) 25 MG tablet, Take 25 mg by mouth 3 (three) times daily as needed for dizziness., Disp: , Rfl:  .  mupirocin ointment (BACTROBAN) 2 %, Apply 1 application topically 2 (two) times daily., Disp: 30 g, Rfl: 2 .  nitroGLYCERIN (NITROSTAT) 0.4 MG SL tablet, Place 0.4 mg under the tongue every 5 (five) minutes as needed for chest pain., Disp: ,  Rfl:  .  omeprazole (PRILOSEC) 20 MG capsule, 20 mg daily., Disp: , Rfl:  .  pantoprazole (PROTONIX) 40 MG tablet, TAKE 1 TABLET BY MOUTH DAILY, Disp: 90 tablet, Rfl: 3 .  pramipexole (MIRAPEX) 0.5 MG tablet, Take 0.5 mg by mouth at bedtime. , Disp: , Rfl:  .  propranolol (INNOPRAN XL) 120 MG 24 hr capsule, Take 120 mg by mouth daily as needed (for BP and migraine). , Disp: , Rfl:  .  terazosin (HYTRIN) 5 MG capsule, Take 5 mg by mouth at bedtime. , Disp: , Rfl:   No Known Allergies        Objective:  Physical Exam  General: AAO x3, NAD  Dermatological: Hyperkeratotic lesion present to the distal aspect the left second toe.  Upon debridement there is a superficial wound present there is no drainage or pus identified.   There is minimal edema to the toe and there is no erythema or warmth.  There is no ascending cellulitis.  There is no fluctuation crepitation any malodor.  Vascular: Dorsalis Pedis artery and Posterior Tibial artery pedal pulses are 2/4 bilateral with immedate capillary fill time.  There is no pain with calf compression, swelling, warmth, erythema.   Neruologic: Grossly intact via light touch bilateral.   Musculoskeletal: Hammertoe deformities present.  Muscular strength 5/5 in all groups tested bilateral.  Gait: Unassisted, Nonantalgic.      Assessment:   Ulceration left second toe, hammertoes    Plan:  -Treatment options discussed including all alternatives, risks, and complications -Etiology of symptoms were discussed -X-rays were obtained and reviewed with the patient. On the lateral view in the second toe distal phalanx there is 2 radiolucencies concerning for possible infection.  This also could correspond to the wound.  There is no soft tissue emphysema. -I debrided the hyperkeratotic lesion to reveal underlying ulceration.  Recommend antibiotic ointment dressing changes daily. -Prescribed Keflex -Offloading at all times.  Dispensed offloading pads. -Monitor for any clinical signs or symptoms of infection and directed to call the office immediately should any occur or go to the ER.  Return in about 10 days (around 01/06/2019).  Trula Slade DPM

## 2019-01-06 ENCOUNTER — Ambulatory Visit (INDEPENDENT_AMBULATORY_CARE_PROVIDER_SITE_OTHER): Payer: Medicare Other

## 2019-01-06 ENCOUNTER — Encounter: Payer: Self-pay | Admitting: Podiatry

## 2019-01-06 ENCOUNTER — Ambulatory Visit: Payer: Medicare Other | Admitting: Podiatry

## 2019-01-06 ENCOUNTER — Other Ambulatory Visit: Payer: Self-pay

## 2019-01-06 VITALS — Temp 97.3°F

## 2019-01-06 DIAGNOSIS — L97521 Non-pressure chronic ulcer of other part of left foot limited to breakdown of skin: Secondary | ICD-10-CM

## 2019-01-06 DIAGNOSIS — M86172 Other acute osteomyelitis, left ankle and foot: Secondary | ICD-10-CM

## 2019-01-06 DIAGNOSIS — M2042 Other hammer toe(s) (acquired), left foot: Secondary | ICD-10-CM

## 2019-01-06 DIAGNOSIS — M7989 Other specified soft tissue disorders: Secondary | ICD-10-CM

## 2019-01-06 MED ORDER — CEPHALEXIN 500 MG PO CAPS: 500.0000 mg | ORAL_CAPSULE | Freq: Three times a day (TID) | ORAL | 0 refills | Status: DC

## 2019-01-06 NOTE — Progress Notes (Signed)
Subjective: 78 year old male presents the office today for follow-up evaluation of a wound to the left second toe.  He states that the toe is doing better and is not as painful.  His main concern is the second toenail is causing the issue.  He states that ever since he had a hammertoe surgery done he gets an infection every once in a while to the toe.  Currently denies any drainage or pus or any increase in swelling or redness or red streaks. Denies any systemic complaints such as fevers, chills, nausea, vomiting. No acute changes since last appointment, and no other complaints at this time.   Objective: AAO x3, NAD DP/PT pulses palpable bilaterally, CRT less than 3 seconds Mild mallet toe was present on the left second toe which is rigid.  Hyperkeratotic lesion the distal portion of the toe.  It appears that the wound had healed but upon debridement of the callus there is still a superficial wound to the distal toe measuring 0.2 x 0.1 x 0.1 cm. There is no probing, undermining or tunneling.  The toenail is hypertrophic, dystrophic and discolored.  No pain to the toe.  The edema has improved. No erythema or ascending cellulitis.  No fluctuation potation.  No open lesions or pre-ulcerative lesions.  No pain with calf compression, swelling, warmth, erythema  Assessment: Left second toe ulceration, concern for osteomyelitis but improvement   Plan: -All treatment options discussed with the patient including all alternatives, risks, complications.  -X-rays were obtained reviewed.  There is a questionable lucency in the lateral view the distal aspect of the second toe.  No definitive cortical changes on the oblique view.  On the AP view there is one small area the distal phalanx distally concerning for possible bony destruction. -Debrided hyperkeratotic tissue to reveal the underlying ulceration with a #312 with scalpel blade and complications of bleeding.  Areas cleaned.  Iodoform is applied followed by  dressing.  Continue antibiotic ointment dressing changes daily. Offloading pad dispensed -Continue course of antibiotics -Monitor for any clinical signs or symptoms of infection and directed to call the office immediately should any occur or go to the ER. -RTC 2 weeks or sooner if needed -Patient encouraged to call the office with any questions, concerns, change in symptoms.   Trula Slade DPM

## 2019-01-21 ENCOUNTER — Other Ambulatory Visit: Payer: Self-pay

## 2019-01-21 ENCOUNTER — Ambulatory Visit: Payer: Medicare Other | Admitting: Podiatry

## 2019-01-21 ENCOUNTER — Ambulatory Visit (INDEPENDENT_AMBULATORY_CARE_PROVIDER_SITE_OTHER): Payer: Medicare Other

## 2019-01-21 DIAGNOSIS — M205X2 Other deformities of toe(s) (acquired), left foot: Secondary | ICD-10-CM | POA: Diagnosis not present

## 2019-01-21 DIAGNOSIS — L97521 Non-pressure chronic ulcer of other part of left foot limited to breakdown of skin: Secondary | ICD-10-CM

## 2019-01-22 NOTE — Progress Notes (Signed)
Subjective: 78 year old male presents the office today for follow-up evaluation of a wound to the left second toe.  Overall he states he is doing better he still has discomfort of the toe.  He states he has not noticed any redness or swelling the toe looks good but still discomfort at the tip of the toe.  He states that he had a hammertoe surgery several years ago and since then has had recurrent infections of the tip of the toe.  He currently denies any drainage or pus or any red streaks.  He has no other concerns today.  He denies any fevers, chills, nausea, vomiting.  Denies any calf pain, chest pain, shortness of breath.   Objective: AAO x3, NAD DP/PT pulses palpable bilaterally, CRT less than 3 seconds Mild mallet toe was present on the left second toe which is rigid.  At the distal aspect the toe there is a hyperkeratotic lesion upon debridement there is a small superficial wound still present but there is no drainage or pus identified today.  There is no fluctuation crepitation any malodor.  No ascending cellulitis.  No pain with calf compression, swelling, warmth, erythema  Assessment: Left second toe ulceration, mallet toe deformity   Plan: -All treatment options discussed with the patient including all alternatives, risks, complications.  -X-rays obtained reviewed.  No definitive cortical changes suggestive of osteomyelitis at this time.  No soft tissue present. -I debrided the wound today utilizing a 312 with scalpel down to healthy, granular tissue.  Continue with antibiotic ointment dressing changes daily.  Continue offloading at all times. -Monitor for any clinical signs or symptoms of infection and directed to call the office immediately should any occur or go to the ER. -Ultimately discussed surgical intervention for arthroplasty at the DIPJ.  We discussed the procedure versus postoperative course.  This is been an ongoing issue for him.  We will check arterial studies before  proceeding with surgical intervention.  Trula Slade DPM

## 2019-01-24 ENCOUNTER — Telehealth: Payer: Self-pay | Admitting: *Deleted

## 2019-01-24 DIAGNOSIS — L97521 Non-pressure chronic ulcer of other part of left foot limited to breakdown of skin: Secondary | ICD-10-CM

## 2019-01-24 DIAGNOSIS — R0989 Other specified symptoms and signs involving the circulatory and respiratory systems: Secondary | ICD-10-CM

## 2019-01-24 DIAGNOSIS — M7989 Other specified soft tissue disorders: Secondary | ICD-10-CM

## 2019-01-24 NOTE — Telephone Encounter (Signed)
-----   Message from Trula Slade, DPM sent at 01/22/2019  2:37 PM EDT ----- Can you please order arterial studies? Thanks.

## 2019-01-24 NOTE — Telephone Encounter (Signed)
Faxed orders to [facility]. 

## 2019-01-27 ENCOUNTER — Other Ambulatory Visit: Payer: Self-pay

## 2019-01-27 ENCOUNTER — Ambulatory Visit (HOSPITAL_COMMUNITY)
Admission: RE | Admit: 2019-01-27 | Discharge: 2019-01-27 | Disposition: A | Discharge status: Home / Self Care | Payer: Medicare Other | Source: Ambulatory Visit | Attending: Cardiology | Admitting: Cardiology

## 2019-01-27 DIAGNOSIS — R0989 Other specified symptoms and signs involving the circulatory and respiratory systems: Secondary | ICD-10-CM | POA: Diagnosis not present

## 2019-01-27 DIAGNOSIS — M7989 Other specified soft tissue disorders: Secondary | ICD-10-CM | POA: Diagnosis not present

## 2019-01-27 DIAGNOSIS — L97521 Non-pressure chronic ulcer of other part of left foot limited to breakdown of skin: Secondary | ICD-10-CM | POA: Diagnosis not present

## 2019-01-28 ENCOUNTER — Telehealth: Payer: Self-pay | Admitting: *Deleted

## 2019-01-28 DIAGNOSIS — R0989 Other specified symptoms and signs involving the circulatory and respiratory systems: Secondary | ICD-10-CM

## 2019-01-28 DIAGNOSIS — L97521 Non-pressure chronic ulcer of other part of left foot limited to breakdown of skin: Secondary | ICD-10-CM

## 2019-01-28 DIAGNOSIS — M7989 Other specified soft tissue disorders: Secondary | ICD-10-CM

## 2019-01-28 NOTE — Telephone Encounter (Signed)
Left message for pt to call to discuss labs.

## 2019-01-28 NOTE — Telephone Encounter (Signed)
-----   Message from Trula Slade, DPM sent at 01/28/2019  7:05 AM EDT ----- Tivis Ringer- can you please put in for a consult with Dr. Gwenlyn Found or Fletcher Anon and please let the patient know that the circulation test was abnormal. I would like him to be seen before any surgical intervention. Thanks.

## 2019-01-28 NOTE — Telephone Encounter (Signed)
-----   Message from Trula Slade, DPM sent at 01/28/2019  7:04 AM EDT ----- Tivis Ringer- can you please schedule an appointment with Dr. Gwenlyn Found or Fletcher Anon for circuclation concerns and wound on toe? Thanks.

## 2019-01-31 ENCOUNTER — Telehealth: Payer: Self-pay | Admitting: Cardiovascular Disease

## 2019-01-31 NOTE — Telephone Encounter (Signed)
LVM for patient to call and schedule referral with Dr. Gwenlyn Found or Dr. Fletcher Anon (urgent referral).

## 2019-01-31 NOTE — Telephone Encounter (Signed)
I informed pt of Dr. Leigh Aurora review of results and referral. I informed pt of Dr. Gwenlyn Found and Dr. Tyrell Antonio [phone] and to call if their office had not called before Wednesday afternoon. Faxed referral to [facility].

## 2019-02-03 ENCOUNTER — Ambulatory Visit: Payer: Medicare Other | Admitting: Podiatry

## 2019-02-24 DIAGNOSIS — I10 Essential (primary) hypertension: Secondary | ICD-10-CM | POA: Diagnosis not present

## 2019-02-24 DIAGNOSIS — R739 Hyperglycemia, unspecified: Secondary | ICD-10-CM | POA: Diagnosis not present

## 2019-02-24 DIAGNOSIS — G2581 Restless legs syndrome: Secondary | ICD-10-CM | POA: Diagnosis not present

## 2019-03-07 ENCOUNTER — Telehealth: Payer: Self-pay | Admitting: Cardiovascular Disease

## 2019-03-07 NOTE — Telephone Encounter (Signed)
LVM, reminding pt of his appt  With Dr Gwenlyn Found on [phone].

## 2019-03-08 ENCOUNTER — Encounter: Payer: Self-pay | Admitting: Cardiovascular Disease

## 2019-03-08 ENCOUNTER — Ambulatory Visit (INDEPENDENT_AMBULATORY_CARE_PROVIDER_SITE_OTHER): Payer: Medicare Other | Admitting: Cardiovascular Disease

## 2019-03-08 ENCOUNTER — Other Ambulatory Visit: Payer: Self-pay

## 2019-03-08 VITALS — BP 154/72 | HR 76 | Temp 98.2°F | Ht 73.0 in | Wt 245.0 lb

## 2019-03-08 DIAGNOSIS — I739 Peripheral vascular disease, unspecified: Secondary | ICD-10-CM

## 2019-03-08 DIAGNOSIS — Z008 Encounter for other general examination: Secondary | ICD-10-CM

## 2019-03-08 NOTE — Assessment & Plan Note (Addendum)
Cody Floyd was referred to me by Dr. Jacqualyn Posey for peripheral artery arterial evaluation.  He has a history of a left second toe hammertoe which has been instrumented in the past.  He does have some skin breakdown of this based on local trauma.  He had Doppler studies performed in our office 01/27/2019 revealing normal ABIs bilaterally, a moderately severe lesion in the mid left SFA but denies claudication with normal tibial vessels.  He has 2+ pedal pulses bilaterally.  I do not think he has significant PAD.  He can have surgery on his foot and low vascular risk.[name] His right toe break brachial index is normal,  Left TBI was 0. 64 which is mildly abnormal but should have enough perfusion to heal the wound.

## 2019-03-08 NOTE — Patient Instructions (Signed)
Medication Instructions:  Your physician recommends that you continue on your current medications as directed. Please refer to the Current Medication list given to you today.  If you need a refill on your cardiac medications before your next appointment, please call your pharmacy.   Lab work: NONE If you have labs (blood work) drawn today and your tests are completely normal, you will receive your results only by: [name] MyChart Message (if you have MyChart) OR . A paper copy in the mail If you have any lab test that is abnormal or we need to change your treatment, we will call you to review the results.  Testing/Procedures: NONE  Follow-Up: At [facility], you and your health needs are our priority.  As part of our continuing mission to provide you with exceptional heart care, we have created designated Provider Care Teams.  These Care Teams include your primary Cardiologist (physician) and Advanced Practice Providers (APPs -  Physician Assistants and Nurse Practitioners) who all work together to provide you with the care you need, when you need it. . You may schedule a follow up appointment AS NEEDED. You may see Dr. Gwenlyn Found or one of the following Advanced Practice Providers on your designated Care Team:   . Kerin Ransom, [place] . Almyra Deforest, PA-C . Fabian Sharp, PA-C . Jory Sims, DNP . Rosaria Ferries, PA-C . Roby Lofts, PA-C . Sande Rives, PA-C  Any Other Special Instructions Will Be Listed Below (If Applicable). You have been cleared at low risk, from a cardiac standpoint, for your upcoming procedure.

## 2019-03-08 NOTE — Progress Notes (Signed)
03/08/2019 Cody Floyd   1941-07-23  [id number]  Primary Physician Cody Gravel, MD Primary Cardiologist: Lorretta Harp MD Cody Floyd, [place]  HPI:  Cody Floyd is a 78 y.o. moderately overweight divorced Caucasian male with no children who is retired Clinical biochemist from [employer] referred by Dr. Carman Floyd for peripheral vascular evaluation.  His cardiologist is Dr. Adrian Floyd.  He has a history of treated hypertension and hyperlipidemia.  He had stenting by Dr. Einar Floyd 06/01/2018 of the mid LAD and circumflex coronary artery.  He is never had a heart attack or stroke.  He denies chest pain or shortness of breath.  Apparently has a left second toe hammertoe followed by Dr. Jacqualyn Floyd which has had been previously instrumented.  He had Doppler studies performed in our office 01/27/2019 revealing normal ABIs bilaterally, normal tibial vessels with a moderate lesion in the mid left SFA although he denies claudication and has 2+ pedal pulses bilaterally.   Current Meds  Medication Sig  . amLODipine (NORVASC) 10 MG tablet Take 10 mg by mouth daily.  [name] aspirin EC 81 MG tablet Take 81 mg by mouth daily.  [name] atorvastatin (LIPITOR) 10 MG tablet Take 10 mg by mouth daily.  [name] atorvastatin (LIPITOR) 20 MG tablet TAKE 1 TABLET BY MOUTH DAILY  . cephALEXin (KEFLEX) 500 MG capsule Take 1 capsule (500 mg total) by mouth 3 (three) times daily.  . clopidogrel (PLAVIX) 75 MG tablet Take 1 tablet (75 mg total) by mouth daily with breakfast.  . meclizine (ANTIVERT) 25 MG tablet Take 25 mg by mouth 3 (three) times daily as needed for dizziness.  . mupirocin ointment (BACTROBAN) 2 % Apply 1 application topically 2 (two) times daily.  . nitroGLYCERIN (NITROSTAT) 0.4 MG SL tablet Place 0.4 mg under the tongue every 5 (five) minutes as needed for chest pain.  [name] omeprazole (PRILOSEC) 20 MG capsule 20 mg daily.  . pantoprazole (PROTONIX) 40 MG tablet TAKE 1 TABLET BY MOUTH DAILY  . pramipexole (MIRAPEX) 0.5 MG tablet  Take 0.5 mg by mouth at bedtime.   . propranolol (INNOPRAN XL) 120 MG 24 hr capsule Take 120 mg by mouth daily as needed (for BP and migraine).   . terazosin (HYTRIN) 5 MG capsule Take 5 mg by mouth at bedtime.      No Known Allergies  Social History   Socioeconomic History  . Marital status: Divorced    Spouse name: Not on file  . Number of children: 0  . Years of education: 8  . 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 Financial resource strain: Not on file  . Food insecurity    Worry: Not on file    Inability: Not on file  . Transportation needs    Medical: Not on file    Non-medical: Not on file  Tobacco Use  . Smoking status: Never Smoker  . Smokeless tobacco: Never Used  Substance and Sexual Activity  . Alcohol use: Not Currently    Comment: 06/01/2018  "probably have 3 beers/month"  . Drug use: Never  . Sexual activity: Not Currently  Lifestyle  . Physical activity    Days per week: Not on file    Minutes per session: Not on file  . Stress: Not on file  Relationships  . Social Herbalist on phone: Not on file    Gets  together: Not on file    Attends religious service: Not on file    Active member of club or organization: Not on file    Attends meetings of clubs or organizations: Not on file    Relationship status: Not on file  . Intimate partner violence    Fear of current or ex partner: Not on file    Emotionally abused: Not on file    Physically abused: Not on file    Forced sexual activity: Not on file  Other Topics Concern  . Not on file  Social History Narrative   Lives with sig other   Caffeine use- coffee all day     Review of Systems: General: negative for chills, fever, night sweats or weight changes.  Cardiovascular: negative for chest pain, dyspnea on exertion, edema, orthopnea, palpitations, paroxysmal nocturnal dyspnea or shortness of breath  Dermatological: negative for rash Respiratory: negative for cough or wheezing Urologic: negative for hematuria Abdominal: negative for nausea, vomiting, diarrhea, bright red blood per rectum, melena, or hematemesis Neurologic: negative for visual changes, syncope, or dizziness All other systems reviewed and are otherwise negative except as noted above.    Blood pressure (!) 154/72, pulse 76, temperature 98.2 F (36.8 C), height 6\' 1"  (1.854 m), weight 245 lb (111.1 kg).  General appearance: alert and no distress Neck: no adenopathy, no carotid bruit, no JVD, supple, symmetrical, trachea midline and thyroid not enlarged, symmetric, no tenderness/mass/nodules Lungs: clear to auscultation bilaterally Heart: regular rate and rhythm, S1, S2 normal, no murmur, click, rub or gallop Extremities: extremities normal, atraumatic, no cyanosis or edema Pulses: 2+ and symmetric Skin: Skin color, texture, turgor normal. No rashes or lesions Neurologic: Alert and oriented X 3, normal strength and tone. Normal symmetric reflexes. Normal coordination and gait  EKG not performed today  ASSESSMENT AND PLAN:   Peripheral arterial disease [facility]) Mr. Cody Floyd was referred to me by Dr. Jacqualyn Floyd for peripheral artery arterial evaluation.  He has a history of a left second toe hammertoe which has been instrumented in the past.  He does have some skin breakdown of this based on local trauma.  He had Doppler studies performed in our office 01/27/2019 revealing normal ABIs bilaterally, a moderately severe lesion in the mid left SFA but denies claudication with normal tibial vessels.  He has 2+ pedal pulses bilaterally.  I do not think he has significant PAD.  He can have surgery on his foot and low vascular risk.[name] His right toe break brachial index is normal,  Left TBI was 0. 64 which is mildly abnormal but should have enough perfusion to heal the wound.      Lorretta Harp MD FACP,FACC,FAHA, [facility] 03/08/2019 3:25  PM

## 2019-03-10 ENCOUNTER — Encounter: Payer: Self-pay | Admitting: Podiatry

## 2019-03-10 ENCOUNTER — Ambulatory Visit: Payer: Medicare Other | Admitting: Podiatry

## 2019-03-10 ENCOUNTER — Other Ambulatory Visit: Payer: Self-pay

## 2019-03-10 VITALS — Temp 98.0°F

## 2019-03-10 DIAGNOSIS — L03032 Cellulitis of left toe: Secondary | ICD-10-CM | POA: Diagnosis not present

## 2019-03-10 DIAGNOSIS — L02612 Cutaneous abscess of left foot: Secondary | ICD-10-CM | POA: Diagnosis not present

## 2019-03-10 DIAGNOSIS — M205X2 Other deformities of toe(s) (acquired), left foot: Secondary | ICD-10-CM | POA: Diagnosis not present

## 2019-03-10 DIAGNOSIS — L97521 Non-pressure chronic ulcer of other part of left foot limited to breakdown of skin: Secondary | ICD-10-CM | POA: Diagnosis not present

## 2019-03-10 MED ORDER — DOXYCYCLINE HYCLATE 100 MG PO TABS: 100.0000 mg | ORAL_TABLET | Freq: Two times a day (BID) | ORAL | 0 refills | Status: DC

## 2019-03-11 ENCOUNTER — Telehealth: Payer: Self-pay | Admitting: *Deleted

## 2019-03-11 NOTE — Progress Notes (Signed)
Subjective: 78 year old male presents the office today for follow-up evaluation of a wound to the left second toe.  He said the toe is been hurting over the last week.  States the calluses reformed.  He denies any drainage or pus or increase in swelling or redness.  He wants to proceed with surgery to help take pressure off the toe.  Does have history of a hammertoe, likely PIPJ arthrodesis.  Unfortunately developed a mallet toe.  He states that he has had several infections in the toe prior to the initial surgery and after. He denies any fevers, chills, nausea, vomiting.  Denies any calf pain, chest pain, shortness of breath.   Objective: AAO x3, NAD DP/PT pulses palpable bilaterally, CRT less than 3 seconds Mild mallet toe was present on the left second toe which is rigid.  At the distal aspect the toe there is a hyperkeratotic lesion, upon debridement there is an area of superficial purulence underneath the callus.  Upon debridement there was no further purulence identified.  Superficial granular wound.  There is no probing to bone, undermining or tunneling.  There is very minimal edema of the distal aspect of the toe.  There is no erythema or warmth No pain with calf compression, swelling, warmth, erythema  Assessment: Left second toe ulceration, mallet toe deformity ; localized abscess  Plan: -All treatment options discussed with the patient including all alternatives, risks, complications.  -I debrided the wound today with a #312 with scalpel.  After debridement I cleaned the area there was no further purulence in the wound.  Be healthy.  Will start doxycycline.  Continue offloading at all times.  We did discussion again regards to both conservative as well as surgical options.  He is a high risk of amputation.  Hopefully in order to help prevent this we discussed DIPJ arthroplasty in order to help avoid pressure to the distal aspect the toe.  He wants to proceed. -The incision placement as well  as the postoperative course was discussed with the patient. I discussed risks of the surgery which include, but not limited to, infection, bleeding, pain, swelling, need for further surgery, delayed or nonhealing, painful or ugly scar, numbness or sensation changes, over/under correction, recurrence, transfer lesions, further deformity, hardware failure, DVT/PE, loss of toe/foot. Patient understands these risks and wishes to proceed with surgery. The surgical consent was reviewed with the patient all 3 pages were signed. No promises or guarantees were given to the outcome of the procedure. All questions were answered to the best of my ability. Before the surgery the patient was encouraged to call the office if there is any further questions. The surgery will be performed in the office under local anesthesia.   Trula Slade DPM

## 2019-03-11 NOTE — Telephone Encounter (Signed)
I can do it anyway next week if he cannot do it that day. I could do at the end of the day on Friday after clinic.

## 2019-03-11 NOTE — Telephone Encounter (Signed)
I am calling you in regards to your surgery that is scheduled for Monday, July 20.  We need to reschedule it to Wednesday at 2:30pm.  "You don't have anything early morning?  The reason I ask is because I'm supposed to babysit that day."  He does not have anything early.  He has surgeries scheduled at the surgical center that morning.  "Okay well, I'll work something out."

## 2019-03-14 ENCOUNTER — Ambulatory Visit: Payer: Medicare Other | Admitting: Podiatry

## 2019-03-14 ENCOUNTER — Telehealth: Payer: Self-pay | Admitting: *Deleted

## 2019-03-14 NOTE — Telephone Encounter (Signed)
I left the patient a message to call me back.  I informed him that we need to reschedule his appointment that's scheduled for July 22 to Friday July 24 instead.  I asked him to give me a call back.  I explained that Dr. Leigh Aurora surgery schedule was changed at the surgical center so his times for his surgery was changed.

## 2019-03-15 ENCOUNTER — Other Ambulatory Visit: Payer: Self-pay

## 2019-03-15 MED ORDER — PANTOPRAZOLE SODIUM 40 MG PO TBEC: 40.0000 mg | DELAYED_RELEASE_TABLET | Freq: Every day | ORAL | 1 refills | Status: DC

## 2019-03-15 NOTE — Telephone Encounter (Signed)
I am calling you in regards to your surgery that's scheduled for tomorrow.  I am calling to let you know that we need to reschedule it to Friday.  Wednesdays are Dr. Leigh Aurora surgery days.  The surgical center has changed his schedule so he's not going to be able to do it on Wednesday.  "Okay, what time on Friday?"  Arrive here at 12:15 pm.  "I'll be there."

## 2019-03-16 ENCOUNTER — Ambulatory Visit: Payer: Medicare Other | Admitting: Podiatry

## 2019-03-17 ENCOUNTER — Telehealth: Payer: Self-pay | Admitting: *Deleted

## 2019-03-17 NOTE — Telephone Encounter (Signed)
DOS 03/18/2019; HAMMER TOE REPAIR 2ND LT FOOT - [id number]  [employer]HC MEDICARE: 08/25/2018 - -  Individual In-Network (Service Year) Deductible Member's plan does not have a deductible  Out-of-Pocket $353.25 MET YTD  $4,146.75 remaining  $4,500.00 Plan Amt.  $35.00 Copayment for each Medicare-covered visit in an office or home setting.     This [employer] Medicare Advantage members plan does not currently require a prior authorization for these services. If you have general questions about the prior authorization requirements, please call us at [phone] or visit [email or web address] > Clinician Resources > Advance and Admission Notification Requirements. The number above acknowledges your notification. Please write this number down for future reference. Notification is not a guarantee of coverage or payment.  Decision ID #:[id number]

## 2019-03-18 ENCOUNTER — Ambulatory Visit (INDEPENDENT_AMBULATORY_CARE_PROVIDER_SITE_OTHER): Payer: Medicare Other

## 2019-03-18 ENCOUNTER — Other Ambulatory Visit: Payer: Self-pay

## 2019-03-18 ENCOUNTER — Encounter: Payer: Self-pay | Admitting: Podiatry

## 2019-03-18 ENCOUNTER — Ambulatory Visit: Payer: Medicare Other | Admitting: Podiatry

## 2019-03-18 VITALS — Temp 98.0°F

## 2019-03-18 DIAGNOSIS — L97521 Non-pressure chronic ulcer of other part of left foot limited to breakdown of skin: Secondary | ICD-10-CM

## 2019-03-18 DIAGNOSIS — M205X2 Other deformities of toe(s) (acquired), left foot: Secondary | ICD-10-CM

## 2019-03-18 MED ORDER — HYDROCODONE-ACETAMINOPHEN 5-325 MG PO TABS: 1.0000 | ORAL_TABLET | Freq: Four times a day (QID) | ORAL | 0 refills | Status: AC | PRN

## 2019-03-18 MED ORDER — DOXYCYCLINE HYCLATE 100 MG PO TABS: 100.0000 mg | ORAL_TABLET | Freq: Two times a day (BID) | ORAL | 0 refills | Status: DC

## 2019-03-22 ENCOUNTER — Ambulatory Visit (INDEPENDENT_AMBULATORY_CARE_PROVIDER_SITE_OTHER): Payer: Medicare Other

## 2019-03-22 ENCOUNTER — Other Ambulatory Visit: Payer: Self-pay

## 2019-03-22 ENCOUNTER — Ambulatory Visit (INDEPENDENT_AMBULATORY_CARE_PROVIDER_SITE_OTHER): Payer: Self-pay | Admitting: Podiatry

## 2019-03-22 DIAGNOSIS — Z09 Encounter for follow-up examination after completed treatment for conditions other than malignant neoplasm: Secondary | ICD-10-CM

## 2019-03-22 DIAGNOSIS — L97521 Non-pressure chronic ulcer of other part of left foot limited to breakdown of skin: Secondary | ICD-10-CM

## 2019-03-22 DIAGNOSIS — M205X2 Other deformities of toe(s) (acquired), left foot: Secondary | ICD-10-CM

## 2019-03-23 NOTE — Progress Notes (Signed)
Subjective: 78 year old male presents the office today for surgical correction of left second digit mallet toe deformity resulting in chronic ulcerations and infections.  He has not had any drainage or pus or swelling to the toe.  He states the toe has not been hurting as much of the callus is not growing back yet.  This is been an ongoing issue. Denies any systemic complaints such as fevers, chills, nausea, vomiting. No acute changes since last appointment, and no other complaints at this time.   Objective: AAO x3, NAD DP/PT pulses palpable bilaterally, CRT less than 3 seconds Noted deformities present at the left second toe resulting hyperkeratotic lesion there is no ongoing ulceration drainage or signs of infection today. No open lesions or pre-ulcerative lesions.  No pain with calf compression, swelling, warmth, erythema  Assessment: Left foot digital deformity resulting in chronic pain, ulceration  Plan: -All treatment options discussed with the patient including all alternatives, risks, complications.  -Again reviewed the x-rays.  Arthritic changes present at the DIPJ.  We discussed both conservative and surgical options and he wishes to proceed with surgery.  The skin was prepped with alcohol and then 3 cc of lidocaine, Marcaine plain was infiltrated in a digital block fashion.  He was brought back to the operating room suite.  The left lower extremity was then scrubbed prepped and draped in normal sterile fashion.  A tourniquet was applied prior to this.  Care was taken to pad all bony prominences.  The left lower extremity was exsanguinated with an Esmarch bandage and pneumatic ankle tourniquet was inflated to 250 mmHg to symmetrical incisions were made on the DIPJ transversely.  15 blade scalpel was utilized to excise a wedge of tissue.  Scissors and it was then transected.  Significant arthritic changes present DIPJ.  Sagittal bone saw was utilized to resect the middle phalanx.  At this  time toe strain more rectus position.  No signs of bone infection or abscess.  Areas irrigated.  Skin was then closed with 5-0 nylon.  Radiology 5 hours Xeroform and dry throat dressing.  Tourniquet was released and there is found to be in immediate cap refill time to the digit.  He tolerated without any complications. -Surgical shoe dispensed -Vicodin as well as reordered antibiotics for the next week -Monitor for any clinical signs or symptoms of infection and directed to call the office immediately should any occur or go to the ER.  No follow-ups on file.  Trula Slade DPM  -Patient encouraged to call the office with any questions, concerns, change in symptoms.

## 2019-03-23 NOTE — Progress Notes (Signed)
Subjective: Cody Floyd is a 78 y.o. is seen today in office s/p left 2nd toe DIPJ arthroplasty preformed on 03/18/2019. He has not had any pain and he is doing well. He is still on antibiotics. Wearing surgical shoe.  Denies any systemic complaints such as fevers, chills, nausea, vomiting. No calf pain, chest pain, shortness of breath.   Objective: General: No acute distress, AAOx3  DP/PT pulses palpable 2/4, CRT < 3 sec to all digits.  LEFT foot: Incision is well coapted without any evidence of dehiscence and sutures intact. There is no surrounding erythema, ascending cellulitis, fluctuance, crepitus, malodor, drainage/purulence. There is minimal edema around the surgical site. There is no pain along the surgical site. No signs of infection or dehiscence. Mild bloody drainage on the bandage, no active bleeding. No other areas of tenderness to bilateral lower extremities.  No other open lesions or pre-ulcerative lesions.  No pain with calf compression, swelling, warmth, erythema.   Assessment and Plan:  Status post left foot surgery, doing well with no complications   -Treatment options discussed including all alternatives, risks, and complications -X-rays were obtained and reviewed with the patient. Status post arthroplasty of the 2nd DIPJ. No acute fracture -Antibiotic ointment and bandage applied. Keep intact for now. This weekend he can start to change it every other day with antibiotic ointment and bandage. -Continue surgical shoe.  -Elevation -Pain medication as needed. -Monitor for any clinical signs or symptoms of infection and DVT/PE and directed to call the office immediately should any occur or go to the ER. -Follow-up as scheduled for likely suture removal or sooner if any problems arise. In the meantime, encouraged to call the office with any questions, concerns, change in symptoms.   Celesta Gentile, DPM

## 2019-03-25 ENCOUNTER — Other Ambulatory Visit: Payer: Medicare Other

## 2019-04-01 ENCOUNTER — Other Ambulatory Visit: Payer: Medicare Other

## 2019-04-05 ENCOUNTER — Ambulatory Visit (INDEPENDENT_AMBULATORY_CARE_PROVIDER_SITE_OTHER): Payer: Self-pay | Admitting: Podiatry

## 2019-04-05 ENCOUNTER — Other Ambulatory Visit: Payer: Self-pay

## 2019-04-05 DIAGNOSIS — L97521 Non-pressure chronic ulcer of other part of left foot limited to breakdown of skin: Secondary | ICD-10-CM

## 2019-04-05 DIAGNOSIS — M205X2 Other deformities of toe(s) (acquired), left foot: Secondary | ICD-10-CM

## 2019-04-06 DIAGNOSIS — M545 Low back pain: Secondary | ICD-10-CM | POA: Diagnosis not present

## 2019-04-06 DIAGNOSIS — I10 Essential (primary) hypertension: Secondary | ICD-10-CM | POA: Diagnosis not present

## 2019-04-06 DIAGNOSIS — M9983 Other biomechanical lesions of lumbar region: Secondary | ICD-10-CM | POA: Diagnosis not present

## 2019-04-06 NOTE — Progress Notes (Signed)
Subjective: Cody Floyd is a 78 y.o. is seen today in office s/p left 2nd toe DIPJ arthroplasty preformed on 03/18/2019.  He states that overall he is doing better and the pain is improving.  Some occasional discomfort but not taking any pain medication.  He presents today wearing a regular schedule.  Presents the procedure removal.   Denies any systemic complaints such as fevers, chills, nausea, vomiting. No calf pain, chest pain, shortness of breath.   Objective: General: No acute distress, AAOx3  DP/PT pulses palpable 2/4, CRT < 3 sec to all digits.  LEFT foot: Incision is well coapted without any evidence of dehiscence and sutures intact.  Incision appears to be healed.  There is no surrounding erythema, ascending cellulitis, fluctuance, crepitus, malodor, drainage/purulence. There is decreased edema around the surgical site. There is no pain along the surgical site. No signs of infection or dehiscence.  No other areas of tenderness to bilateral lower extremities.  No other open lesions or pre-ulcerative lesions.  No pain with calf compression, swelling, warmth, erythema.   Assessment and Plan:  Status post left foot surgery, doing well with no complications   -Treatment options discussed including all alternatives, risks, and complications -Sutures removed today without complications.  Incision remains well coapted.  No appointment dressing changes applied today.  Continue with this as well.  Remain a stiffer soled shoe.  Gradual increase activity.  Continue ice elevation. -Monitor for any clinical signs or symptoms of infection and directed to call the office immediately should any occur or go to the ER.   Trula Slade DPM

## 2019-04-18 ENCOUNTER — Telehealth: Payer: Self-pay | Admitting: Orthopedic Surgery

## 2019-04-18 NOTE — Telephone Encounter (Signed)
Please complete blue sheet. Thanks.  

## 2019-04-18 NOTE — Telephone Encounter (Signed)
Patient called to let you know that he is ready to move on with the surgery for his other knee.  CB#[phone].  Thank you.

## 2019-04-19 ENCOUNTER — Other Ambulatory Visit: Payer: Medicare Other

## 2019-04-20 NOTE — Telephone Encounter (Signed)
Done pls calal thx debbie

## 2019-05-10 ENCOUNTER — Other Ambulatory Visit: Payer: Self-pay

## 2019-05-17 NOTE — Progress Notes (Signed)
[facility] [place] [address] Phone: [phone] Fax: [place], [place] [address] [place] [address] Phone: [phone] Fax: [phone]      Your procedure is scheduled on Tuesday 05/24/2019.  Report to  [facility] Main Entrance "A" at 09:45 A.M., and check in at the Admitting office.  Call this number if you have problems the morning of surgery:  [phone]  Call [phone] if you have any questions prior to your surgery date Monday-Friday 8am-4pm    Remember:  Do not eat after midnight the night before your surgery  You may drink clear liquids until 08:45am the morning of your surgery.   Clear liquids allowed are: Water, Non-Citrus Juices (without pulp), Carbonated Beverages, Clear Tea, Black Coffee Only, and Gatorade  Enhanced Recovery after Surgery for Orthopedics Enhanced Recovery after Surgery is a protocol used to improve the stress on your body and your recovery after surgery.  Patient Instructions  . The night before surgery:  o No food after midnight. ONLY clear liquids after midnight  .  [name] The day of surgery (if you do NOT have diabetes):  o Drink ONE (1) Pre-Surgery Clear Ensure as directed.   o This drink was given to you during your hospital  pre-op appointment visit. o The pre-op nurse will instruct you on the time to drink the  Pre-Surgery Ensure depending on your surgery time. o Finish the drink at the designated time by the pre-op nurse.  o Nothing else to drink after completing the  Pre-Surgery Clear Ensure.          If you have questions, please contact your surgeon's office.     Take these medicines the morning of surgery with A SIP OF WATER: Atorvastatin (Lipitor) Meclizine (Antivert) - if needed for dizziness Nitroglycerin (Nitrostat) - if needed  for chest pain Pantoprazole (Protonix)   7 days prior to surgery STOP taking Aleve, Naproxen, Ibuprofen, Motrin, Advil, Goody's, BC's, all herbal medications, fish oil, and all vitamins.  Follow your surgeon's instructions on when to stop Aspirin and Plavix.  If no instructions were given by your Physician then you will need to call the office to get those instructions.       The Morning of Surgery  Do not wear jewelry.  Do not wear lotions, powders, or colognes, or deodorant  Men may shave face and neck.  Do not bring valuables to the hospital.  [facility] is not responsible for any belongings or valuables.  If you are a smoker, DO NOT Smoke 24 hours prior to surgery  If you wear a CPAP at night please bring your mask, tubing, and machine the morning of surgery   Remember that you must have someone to transport you home after your surgery, and remain with you for 24 hours if you are discharged the same day.   Contacts, eyeglasses, hearing aids, dentures or bridgework may not be worn into surgery.    Leave your suitcase in the car.  After surgery it may be brought to your room.  For patients admitted to the hospital, discharge time will be determined by your treatment team.  Patients discharged the day of surgery will not be allowed to drive home.    Special instructions:   [place]- Preparing For Surgery  Before surgery,  you can play an important role. Because skin is not sterile, your skin needs to be as free of germs as possible. You can reduce the number of germs on your skin by washing with CHG (chlorahexidine gluconate) Soap before surgery.  CHG is an antiseptic cleaner which kills germs and bonds with the skin to continue killing germs even after washing.    Oral Hygiene is also important to reduce your risk of infection.  Remember - BRUSH YOUR TEETH THE MORNING OF SURGERY WITH YOUR REGULAR TOOTHPASTE  Please do not use if you have an allergy to CHG or  antibacterial soaps. If your skin becomes reddened/irritated stop using the CHG.  Do not shave (including legs and underarms) for at least 48 hours prior to first CHG shower. It is OK to shave your face.  Please follow these instructions carefully.   1. Shower the NIGHT BEFORE SURGERY and the MORNING OF SURGERY with CHG Soap.   2. If you chose to wash your hair, wash your hair first as usual with your normal shampoo.  3. After you shampoo, rinse your hair and body thoroughly to remove the shampoo.  4. Use CHG as you would any other liquid soap. You can apply CHG directly to the skin and wash gently with a scrungie or a clean washcloth.   5. Apply the CHG Soap to your body ONLY FROM THE NECK DOWN.  Do not use on open wounds or open sores. Avoid contact with your eyes, ears, mouth and genitals (private parts). Wash Face and genitals (private parts)  with your normal soap.   6. Wash thoroughly, paying special attention to the area where your surgery will be performed.  7. Thoroughly rinse your body with warm water from the neck down.  8. DO NOT shower/wash with your normal soap after using and rinsing off the CHG Soap.  9. Pat yourself dry with a CLEAN TOWEL.  10. Wear CLEAN PAJAMAS to bed the night before surgery, wear comfortable clothes the morning of surgery  11. Place CLEAN SHEETS on your bed the night of your first shower and DO NOT SLEEP WITH PETS.    Day of Surgery:  Please shower the morning of surgery with the CHG soap  Do not apply any deodorants/lotions. Please wear clean clothes to the hospital/surgery center.   Remember to brush your teeth WITH YOUR REGULAR TOOTHPASTE.   Please read over the following fact sheets that you were given.

## 2019-05-18 ENCOUNTER — Encounter (HOSPITAL_COMMUNITY)
Admission: RE | Admit: 2019-05-18 | Discharge: 2019-05-18 | Disposition: A | Discharge status: Home / Self Care | Payer: Medicare Other | Source: Ambulatory Visit | Attending: Orthopedic Surgery | Admitting: Orthopedic Surgery

## 2019-05-18 ENCOUNTER — Encounter (HOSPITAL_COMMUNITY): Payer: Self-pay

## 2019-05-18 ENCOUNTER — Other Ambulatory Visit: Payer: Self-pay

## 2019-05-18 DIAGNOSIS — Z01812 Encounter for preprocedural laboratory examination: Secondary | ICD-10-CM | POA: Insufficient documentation

## 2019-05-18 LAB — BASIC METABOLIC PANEL WITH GFR
Anion gap: 10 (ref 5–15)
BUN: 18 mg/dL (ref 8–23)
CO2: 21 mmol/L — ABNORMAL LOW (ref 22–32)
Calcium: 9.4 mg/dL (ref 8.9–10.3)
Chloride: 109 mmol/L (ref 98–111)
Creatinine, Ser: 0.95 mg/dL (ref 0.61–1.24)
GFR calc Af Amer: >60 mL/min
GFR calc non Af Amer: >60 mL/min
Glucose, Bld: 112 mg/dL — ABNORMAL HIGH (ref 70–99)
Potassium: 4 mmol/L (ref 3.5–5.1)
Sodium: 140 mmol/L (ref 135–145)

## 2019-05-18 LAB — CBC
HCT: 45.3 % (ref 39.0–52.0)
Hemoglobin: 14.6 g/dL (ref 13.0–17.0)
MCH: 28.4 pg (ref 26.0–34.0)
MCHC: 32.2 g/dL (ref 30.0–36.0)
MCV: 88.1 fL (ref 80.0–100.0)
Platelets: 152 K/uL (ref 150–400)
RBC: 5.14 MIL/uL (ref 4.22–5.81)
RDW: 14.6 % (ref 11.5–15.5)
WBC: 5.6 K/uL (ref 4.0–10.5)
nRBC: 0 % (ref 0.0–0.2)

## 2019-05-18 LAB — URINALYSIS, ROUTINE W REFLEX MICROSCOPIC
Bilirubin Urine: NEGATIVE
Glucose, UA: NEGATIVE mg/dL
Hgb urine dipstick: NEGATIVE
Ketones, ur: NEGATIVE mg/dL
Leukocytes,Ua: NEGATIVE
Nitrite: NEGATIVE
Protein, ur: NEGATIVE mg/dL
Specific Gravity, Urine: 1.016 (ref 1.005–1.030)
pH: 5 (ref 5.0–8.0)

## 2019-05-18 LAB — SURGICAL PCR SCREEN
MRSA, PCR: NEGATIVE
Staphylococcus aureus: NEGATIVE

## 2019-05-18 NOTE — Progress Notes (Signed)
PCP - Dr. Jani Gravel Cardiologist - Dr. Adrian Prows  Chest x-ray - N/A EKG - 10/9//19 Stress Test - 04/02/18 ECHO - 05/13/18 Cardiac Cath - 06/01/18  Sleep Study - No CPAP - N/A  Blood Thinner Instructions: Plavix: patient instructed to call surgeons office for instructions Aspirin Instructions: Patient instructed to call surgeon's office for instructions  ERAS Protcol - Yes PRE-SURGERY Ensure - Given   COVID TEST- Scheduled for 05/20/2019  Anesthesia review: YES, cardiac hx  Patient denies shortness of breath, fever, cough and chest pain at PAT appointment  Patient verbalized understanding of instructions that were given to them at the PAT appointment. Patient was also instructed that they will need to review over the PAT instructions again at home before surgery.

## 2019-05-19 ENCOUNTER — Ambulatory Visit (INDEPENDENT_AMBULATORY_CARE_PROVIDER_SITE_OTHER): Payer: Medicare Other

## 2019-05-19 ENCOUNTER — Other Ambulatory Visit: Payer: Self-pay

## 2019-05-19 ENCOUNTER — Encounter: Payer: Self-pay | Admitting: Cardiology

## 2019-05-19 DIAGNOSIS — R0989 Other specified symptoms and signs involving the circulatory and respiratory systems: Secondary | ICD-10-CM | POA: Diagnosis not present

## 2019-05-19 LAB — URINE CULTURE: Culture: NO GROWTH

## 2019-05-20 ENCOUNTER — Telehealth: Payer: Self-pay | Admitting: Orthopedic Surgery

## 2019-05-20 ENCOUNTER — Other Ambulatory Visit (HOSPITAL_COMMUNITY)
Admission: RE | Admit: 2019-05-20 | Discharge: 2019-05-20 | Disposition: A | Discharge status: Home / Self Care | Payer: Medicare Other | Source: Ambulatory Visit | Attending: Orthopedic Surgery | Admitting: Orthopedic Surgery

## 2019-05-20 DIAGNOSIS — Z20828 Contact with and (suspected) exposure to other viral communicable diseases: Secondary | ICD-10-CM | POA: Diagnosis not present

## 2019-05-20 DIAGNOSIS — Z01812 Encounter for preprocedural laboratory examination: Secondary | ICD-10-CM | POA: Diagnosis not present

## 2019-05-20 DIAGNOSIS — I10 Essential (primary) hypertension: Secondary | ICD-10-CM | POA: Diagnosis not present

## 2019-05-20 DIAGNOSIS — Z0000 Encounter for general adult medical examination without abnormal findings: Secondary | ICD-10-CM | POA: Diagnosis not present

## 2019-05-20 DIAGNOSIS — Z23 Encounter for immunization: Secondary | ICD-10-CM | POA: Diagnosis not present

## 2019-05-21 LAB — NOVEL CORONAVIRUS, NAA (HOSP ORDER, SEND-OUT TO REF LAB; TAT 18-24 HRS): SARS-CoV-2, NAA: NOT DETECTED

## 2019-05-23 MED ORDER — TRANEXAMIC ACID-NACL 1000-0.7 MG/100ML-% IV SOLN
1000.0000 mg | INTRAVENOUS | Status: AC
  Administered 2019-05-24: 1000 mg via INTRAVENOUS
  Filled 2019-05-23: qty 100

## 2019-05-23 MED ORDER — DEXTROSE 5 % IV SOLN
3.0000 g | INTRAVENOUS | Status: AC
  Administered 2019-05-24: 3 g via INTRAVENOUS
  Filled 2019-05-23: qty 3

## 2019-05-23 NOTE — Anesthesia Preprocedure Evaluation (Addendum)
Anesthesia Evaluation  Patient identified by MRN, date of birth, ID band Patient awake    Reviewed: Allergy & Precautions, NPO status , Patient's Chart, lab work & pertinent test results  Airway Mallampati: I  TM Distance: >3 FB Neck ROM: Full    Dental no notable dental hx. (+) Teeth Intact, Dental Advisory Given   Pulmonary neg pulmonary ROS,    Pulmonary exam normal breath sounds clear to auscultation       Cardiovascular hypertension, + angina + CAD, + Cardiac Stents (05/2018) and + Peripheral Vascular Disease  Normal cardiovascular exam Rhythm:Regular Rate:Normal  HLD  EKG: 06/02/18: Sinus rhythm with 1st degree A-V block Right bundle branch block Possible Lateral infarct , age undetermined Abnormal ECG since last tracing no significant change   Coronary angiogram 06/01/2018: Short left main, diffusely diseased proximal and mid LAD with 80% proximal 99% mid LAD stenosis at D1 origin, D1 is very large. Mild ectasia noted in the LAD. S/P 2.5 x 40, 2.75 x 15 and 2.75 x 13 mm Orsiro DES 99% to0% with TIMI 3 to 3 flow. Large circumflex with large OM1, OM 1 proximal 80% stenosis to 0% S/P 2.5 x 13 mm Orsiro DES with TIMI-3 to TIMI-3 flow. Otherwise circumflex normal. RCA very large with large PL and PDA branches mid segment has 50% stenosis. LV: Normal LVEDP and LV systolic function. - Recommend uninterrupted dual antiplatelet therapy with Aspirin 81mg  daily and Clopidogrel 75mg  dailyfor a minimum of 6 months (stable ischemic heart disease - Class I recommendation).   Neuro/Psych negative neurological ROS  negative psych ROS   GI/Hepatic Neg liver ROS, GERD  Medicated,  Endo/Other  negative endocrine ROS  Renal/GU negative Renal ROS  negative genitourinary   Musculoskeletal  (+) Arthritis , L3-5 laminectomy 2015   Abdominal   Peds  Hematology  (+) Blood dyscrasia (on plavix, 5-7d), ,   Anesthesia Other  Findings   Reproductive/Obstetrics                           Anesthesia Physical Anesthesia Plan  ASA: III  Anesthesia Plan: Spinal and Regional   Post-op Pain Management:  Regional for Post-op pain   Induction:   PONV Risk Score and Plan: Treatment may vary due to age or medical condition  Airway Management Planned: Natural Airway  Additional Equipment:   Intra-op Plan:   Post-operative Plan:   Informed Consent: I have reviewed the patients History and Physical, chart, labs and discussed the procedure including the risks, benefits and alternatives for the proposed anesthesia with the patient or authorized representative who has indicated his/her understanding and acceptance.     Dental advisory given  Plan Discussed with: CRNA  Anesthesia Plan Comments:       Anesthesia Quick Evaluation

## 2019-05-23 NOTE — Progress Notes (Addendum)
Anesthesia Chart Review:  Case: [id number] Date/Time: 05/24/19 0715   Procedure: RIGHT TOTAL KNEE ARTHROPLASTY (Right Knee)   Anesthesia type: Spinal   Pre-op diagnosis: right knee osteoarthritis   Location: [facility] OR ROOM 06 / [place] OR   Surgeon: Meredith Pel, MD      DISCUSSION: Patient is a 78 year old male scheduled for the above procedure.  History includes never smoker, CAD (s/p DES LAD (stent treats ostial LAD, ostial-proximal LAD, proximal LAD lesions) and DES ostial OM1 06/01/18), HTN, HLD, GERD, vertigo, RLS, ventral hernia, BPH, back surgeries (remote back surgery; s/p L3-5 central laminectomy 05/29/14), left TKA 03/31/17. BMI is consistent with obesity.   Cardiologist Dr. Einar Gip composed a letter of surgical clearance (see Letter tab), stating patient was "at low risk, from a cardiac standpoint, For his upcoming procedure right total knee replacement." He was advised that he could hold Plavix for 5 days prior to surgery (however, 10/28/18 notation by Dr. Einar Gip summarizes a conversation regarding holding Plavix given severe back pain and need for injections. Dr. Einar Gip wrote, "I discussed with the patient, advised him that as he has been on aspirin and Plavix for 6 months, since his elective angioplasty, he can stop his Plavix permanently, but need to continue aspirin indefinitely.")  I advised Debbie at Dr. Randel Pigg office of this, particularly since if Plavix only stopped for 5 days then he will likely require GA instead of spinal anesthesia--however, based on her conversation with him and his girlfriend, it sounds like he has been on Plavix since this mentioned conversation with Dr. Einar Gip in March.   05/20/19 presurgical COVID-19 test was negative. Definitive anesthesia plan following day of surgery evaluation.    VS: BP 124/64 Comment: taken at home, significant other called PAT nurse and made aware  Pulse 69   Temp (!) 36.1 C   Resp 20   Ht 6' (1.829 m)   Wt 111.4 kg   SpO2 97%   BMI  33.32 kg/m    PROVIDERS: Jani Gravel, MD is PCP - Adrian Prows, MD is primary cardiologist - He was also seen by cardiologist Quay Burow, MD on 03/08/19 for PV evaluation (referred by podiatry). By notes, 01/27/19 PV Doppler studies revealed, "normal ABIs bilaterally, a moderately severe lesion in the mid left SFA but denies claudication with normal tibial vessels.  He has 2+ pedal pulses bilaterally.  I do not think he has significant PAD."   LABS: Labs reviewed: Acceptable for surgery. (all labs ordered are listed, but only abnormal results are displayed)  Labs Reviewed  BASIC METABOLIC PANEL - Abnormal; Notable for the following components:      Result Value   CO2 21 (*)    Glucose, Bld 112 (*)    All other components within normal limits  SURGICAL PCR SCREEN  URINE CULTURE  CBC  URINALYSIS, ROUTINE W REFLEX MICROSCOPIC     IMAGES: MRI L-spine 01/31/16: IMPRESSION:  Abnormal MRI lumbar spine (with and without) demonstrating: 1. At L3-4: posterior decompression, disc bulging, facet hypertrophy, ligamentum flavum hypertrophy, with severe right and moderate left foraminal stenosis 2. At L4-5: posterior decompression, disc bulging, facet hypertrophy, ligamentum flavum hypertrophy, with severe biforaminal stenosis 3. At L5-S1: right laminectomy, bone spurring, facet hypertrophy with moderate-severe biforaminal stenosis and right greater than left lateral recess stenoses 4. Enhancing cystic lesions in the right L3 pedicle and right L4-5 facet joint, may represent synovial cysts. These are stable from CT lumbar spine on 01/14/16, but not seen on MRI from 04/21/14.  5. Compared to MRI on 04/21/14, there has been interval lumbar decompression surgery.     EKG: 06/02/18: Sinus rhythm with 1st degree A-V block Right bundle branch block Possible Lateral infarct , age undetermined Abnormal ECG since last tracing no significant change Confirmed by Larae Grooms [phone]) on 06/02/2018  9:04:48 AM   CV: Carotid US 05/19/2019:  Conclusions: No hemodynamically significant stenosis noted in bilateral internal carotid arteries.  Mild diffuse heterogeneous plaque noted.  Antegrade right vertebral artery flow.  Antegrade left vertebral artery flow.  Compared to 05/13/2018, bilateral 15-49% stenosis not present   Coronary angiogram 06/01/2018: Short left main, diffusely diseased proximal and mid LAD with 80% proximal 99% mid LAD stenosis at D1 origin, D1 is very large.  Mild ectasia noted in the LAD.  S/P 2.5 x 40, 2.75 x 15 and 2.75 x 13 mm Orsiro DES 99% to0% with TIMI 3 to 3 flow. Large circumflex with large OM1, OM 1 proximal 80% stenosis to 0% S/P 2.5 x 13 mm Orsiro DES with TIMI-3 to TIMI-3 flow.  Otherwise circumflex normal. RCA very large with large PL and PDA branches mid segment has 50% stenosis. LV: Normal LVEDP and LV systolic function. - Recommend uninterrupted dual antiplatelet therapy with Aspirin 81mg  daily and Clopidogrel 75mg  daily for a minimum of 6 months (stable ischemic heart disease - Class I recommendation).    Past Medical History:  Diagnosis Date  . Arthritis    "a little all over my body" (06/01/2018)  . Chronic lower back pain   . GERD (gastroesophageal reflux disease)   . History of BPH   . Hyperlipidemia   . Hypertension   . RLS (restless legs syndrome)   . Ventral hernia   . Vertigo     Past Surgical History:  Procedure Laterality Date  . BACK SURGERY    . CORONARY ANGIOPLASTY WITH STENT PLACEMENT  06/01/2018  . CORONARY STENT INTERVENTION N/A 06/01/2018   Procedure: CORONARY STENT INTERVENTION;  Surgeon: Adrian Prows, MD;  Location: [facility];  Service: Cardiovascular;  Laterality: N/A;  . HAMMER TOE SURGERY Left 2012  . JOINT REPLACEMENT    . LEFT HEART CATH AND CORONARY ANGIOGRAPHY N/A 06/01/2018   Procedure: LEFT HEART CATH AND CORONARY ANGIOGRAPHY;  Surgeon: Adrian Prows, MD;  Location: [facility];  Service: Cardiovascular;   Laterality: N/A;  . LUMBAR [place]     "I had 2 other lower back ORs before the one in 2015"  . LUMBAR LAMINECTOMY/DECOMPRESSION MICRODISCECTOMY N/A 05/29/2014   Procedure: CENTRAL LUMBAR LAMINECTOMY L3-4 and L4-5;  Surgeon: Jessy Oto, MD;  Location: [facility];  Service: Orthopedics;  Laterality: N/A;  . TOTAL KNEE ARTHROPLASTY Left 03/31/2017   Procedure: LEFT TOTAL KNEE ARTHROPLASTY;  Surgeon: Meredith Pel, MD;  Location: [facility];  Service: Orthopedics;  Laterality: Left;    MEDICATIONS: . aspirin EC 81 MG tablet  . atorvastatin (LIPITOR) 20 MG tablet  . clopidogrel (PLAVIX) 75 MG tablet  . meclizine (ANTIVERT) 25 MG tablet  . nitroGLYCERIN (NITROSTAT) 0.4 MG SL tablet  . pantoprazole (PROTONIX) 40 MG tablet  . pramipexole (MIRAPEX) 0.5 MG tablet  . terazosin (HYTRIN) 5 MG capsule   No current facility-administered medications for this encounter.    Derrill Memo ON 05/24/2019] ceFAZolin (ANCEF) 3 g in dextrose 5 % 50 mL IVPB  . [START ON 05/24/2019] tranexamic acid (CYKLOKAPRON) IVPB 1,000 mg     Myra Gianotti, PA-C Surgical Short Stay/Anesthesiology [facility] Phone [phone]  [facility] Phone [phone] 05/23/2019 2:35 PM

## 2019-05-24 ENCOUNTER — Observation Stay (HOSPITAL_COMMUNITY)
Admission: AD | Admit: 2019-05-24 | Discharge: 2019-05-25 | Disposition: A | Discharge status: Home / Self Care | Payer: Medicare Other | Attending: Orthopedic Surgery | Admitting: Orthopedic Surgery

## 2019-05-24 ENCOUNTER — Encounter (HOSPITAL_COMMUNITY)
Admission: AD | Disposition: A | Discharge status: Home / Self Care | Payer: Self-pay | Source: Home / Self Care | Attending: Orthopedic Surgery

## 2019-05-24 ENCOUNTER — Encounter (HOSPITAL_COMMUNITY): Payer: Self-pay

## 2019-05-24 ENCOUNTER — Ambulatory Visit (HOSPITAL_COMMUNITY): Payer: Medicare Other | Admitting: Anesthesiology

## 2019-05-24 ENCOUNTER — Ambulatory Visit (HOSPITAL_COMMUNITY): Payer: Medicare Other | Admitting: Physician Assistant

## 2019-05-24 ENCOUNTER — Other Ambulatory Visit: Payer: Self-pay

## 2019-05-24 DIAGNOSIS — I25119 Atherosclerotic heart disease of native coronary artery with unspecified angina pectoris: Secondary | ICD-10-CM | POA: Insufficient documentation

## 2019-05-24 DIAGNOSIS — M1711 Unilateral primary osteoarthritis, right knee: Secondary | ICD-10-CM | POA: Diagnosis not present

## 2019-05-24 DIAGNOSIS — Z79899 Other long term (current) drug therapy: Secondary | ICD-10-CM | POA: Insufficient documentation

## 2019-05-24 DIAGNOSIS — D759 Disease of blood and blood-forming organs, unspecified: Secondary | ICD-10-CM | POA: Diagnosis not present

## 2019-05-24 DIAGNOSIS — E669 Obesity, unspecified: Secondary | ICD-10-CM | POA: Insufficient documentation

## 2019-05-24 DIAGNOSIS — I739 Peripheral vascular disease, unspecified: Secondary | ICD-10-CM | POA: Diagnosis not present

## 2019-05-24 DIAGNOSIS — I10 Essential (primary) hypertension: Secondary | ICD-10-CM | POA: Diagnosis not present

## 2019-05-24 DIAGNOSIS — Z96652 Presence of left artificial knee joint: Secondary | ICD-10-CM | POA: Insufficient documentation

## 2019-05-24 DIAGNOSIS — G2581 Restless legs syndrome: Secondary | ICD-10-CM | POA: Diagnosis not present

## 2019-05-24 DIAGNOSIS — G8918 Other acute postprocedural pain: Secondary | ICD-10-CM | POA: Diagnosis not present

## 2019-05-24 DIAGNOSIS — Z7902 Long term (current) use of antithrombotics/antiplatelets: Secondary | ICD-10-CM | POA: Insufficient documentation

## 2019-05-24 DIAGNOSIS — K219 Gastro-esophageal reflux disease without esophagitis: Secondary | ICD-10-CM | POA: Diagnosis not present

## 2019-05-24 DIAGNOSIS — E785 Hyperlipidemia, unspecified: Secondary | ICD-10-CM | POA: Diagnosis not present

## 2019-05-24 DIAGNOSIS — Z955 Presence of coronary angioplasty implant and graft: Secondary | ICD-10-CM | POA: Insufficient documentation

## 2019-05-24 DIAGNOSIS — Z6832 Body mass index (BMI) 32.0-32.9, adult: Secondary | ICD-10-CM | POA: Diagnosis not present

## 2019-05-24 DIAGNOSIS — Z7982 Long term (current) use of aspirin: Secondary | ICD-10-CM | POA: Diagnosis not present

## 2019-05-24 HISTORY — PX: TOTAL KNEE ARTHROPLASTY: SHX125

## 2019-05-24 SURGERY — ARTHROPLASTY, KNEE, TOTAL
Anesthesia: Regional | Site: Knee | Laterality: Right

## 2019-05-24 MED ORDER — NAPROXEN 250 MG PO TABS
250.0000 mg | ORAL_TABLET | Freq: Two times a day (BID) | ORAL | Status: DC
  Administered 2019-05-24 – 2019-05-25 (×2): 250 mg via ORAL
  Filled 2019-05-24 (×4): qty 1

## 2019-05-24 MED ORDER — OXYCODONE HCL 5 MG PO TABS
ORAL_TABLET | ORAL | Status: AC
  Filled 2019-05-24: qty 1

## 2019-05-24 MED ORDER — ONDANSETRON HCL 4 MG/2ML IJ SOLN
INTRAMUSCULAR | Status: DC | PRN
  Administered 2019-05-24: 4 mg via INTRAVENOUS

## 2019-05-24 MED ORDER — CHLORHEXIDINE GLUCONATE 4 % EX LIQD: 60.0000 mL | Freq: Once | CUTANEOUS | Status: DC

## 2019-05-24 MED ORDER — MENTHOL 3 MG MT LOZG: 1.0000 | LOZENGE | OROMUCOSAL | Status: DC | PRN

## 2019-05-24 MED ORDER — CLONIDINE HCL (ANALGESIA) 100 MCG/ML EP SOLN
EPIDURAL | Status: DC | PRN
  Administered 2019-05-24: 100 ug

## 2019-05-24 MED ORDER — TERAZOSIN HCL 5 MG PO CAPS: 5.0000 mg | ORAL_CAPSULE | Freq: Every day | ORAL | Status: DC

## 2019-05-24 MED ORDER — PANTOPRAZOLE SODIUM 40 MG PO TBEC
40.0000 mg | DELAYED_RELEASE_TABLET | Freq: Every day | ORAL | Status: DC
  Administered 2019-05-25: 40 mg via ORAL
  Filled 2019-05-24: qty 1

## 2019-05-24 MED ORDER — DEXAMETHASONE SODIUM PHOSPHATE 10 MG/ML IJ SOLN
INTRAMUSCULAR | Status: DC | PRN
  Administered 2019-05-24: 5 mg

## 2019-05-24 MED ORDER — MORPHINE SULFATE (PF) 4 MG/ML IV SOLN
INTRAVENOUS | Status: DC | PRN
  Administered 2019-05-24 (×2): 4 mg via INTRAMUSCULAR

## 2019-05-24 MED ORDER — FENTANYL CITRATE (PF) 100 MCG/2ML IJ SOLN
INTRAMUSCULAR | Status: AC
  Filled 2019-05-24: qty 2

## 2019-05-24 MED ORDER — MIDAZOLAM HCL 2 MG/2ML IJ SOLN
INTRAMUSCULAR | Status: DC | PRN
  Administered 2019-05-24 (×2): 1 mg via INTRAVENOUS

## 2019-05-24 MED ORDER — BUPIVACAINE LIPOSOME 1.3 % IJ SUSP
20.0000 mL | INTRAMUSCULAR | Status: AC
  Administered 2019-05-24: 20 mL
  Filled 2019-05-24: qty 20

## 2019-05-24 MED ORDER — METHOCARBAMOL 500 MG PO TABS
500.0000 mg | ORAL_TABLET | Freq: Four times a day (QID) | ORAL | Status: DC | PRN
  Administered 2019-05-24 – 2019-05-25 (×3): 500 mg via ORAL
  Filled 2019-05-24 (×2): qty 1

## 2019-05-24 MED ORDER — CLONIDINE HCL (ANALGESIA) 100 MCG/ML EP SOLN
EPIDURAL | Status: AC
  Filled 2019-05-24: qty 10

## 2019-05-24 MED ORDER — MIDAZOLAM HCL 2 MG/2ML IJ SOLN
INTRAMUSCULAR | Status: AC
  Filled 2019-05-24: qty 2

## 2019-05-24 MED ORDER — CEFAZOLIN SODIUM-DEXTROSE 2-4 GM/100ML-% IV SOLN
2.0000 g | Freq: Four times a day (QID) | INTRAVENOUS | Status: AC
  Administered 2019-05-24 (×2): 2 g via INTRAVENOUS
  Filled 2019-05-24 (×2): qty 100

## 2019-05-24 MED ORDER — ONDANSETRON HCL 4 MG/2ML IJ SOLN: 4.0000 mg | Freq: Four times a day (QID) | INTRAMUSCULAR | Status: DC | PRN

## 2019-05-24 MED ORDER — CLOPIDOGREL BISULFATE 75 MG PO TABS
75.0000 mg | ORAL_TABLET | Freq: Every day | ORAL | Status: DC
  Administered 2019-05-25: 75 mg via ORAL
  Filled 2019-05-24: qty 1

## 2019-05-24 MED ORDER — PROPOFOL 1000 MG/100ML IV EMUL
INTRAVENOUS | Status: AC
  Filled 2019-05-24: qty 100

## 2019-05-24 MED ORDER — PROPOFOL 500 MG/50ML IV EMUL
INTRAVENOUS | Status: DC | PRN
  Administered 2019-05-24: 25 ug/kg/min via INTRAVENOUS

## 2019-05-24 MED ORDER — ACETAMINOPHEN 325 MG PO TABS
325.0000 mg | ORAL_TABLET | Freq: Four times a day (QID) | ORAL | Status: DC | PRN
  Administered 2019-05-25 (×2): 650 mg via ORAL
  Filled 2019-05-24 (×2): qty 2

## 2019-05-24 MED ORDER — BUPIVACAINE HCL 0.25 % IJ SOLN
INTRAMUSCULAR | Status: DC | PRN
  Administered 2019-05-24 (×2): 15 mL

## 2019-05-24 MED ORDER — TRANEXAMIC ACID 1000 MG/10ML IV SOLN
2000.0000 mg | INTRAVENOUS | Status: AC
  Administered 2019-05-24: 2000 mg via TOPICAL
  Filled 2019-05-24: qty 20

## 2019-05-24 MED ORDER — PROPOFOL 10 MG/ML IV BOLUS
INTRAVENOUS | Status: DC | PRN
  Administered 2019-05-24 (×3): 20 mg via INTRAVENOUS
  Administered 2019-05-24: 10 mg via INTRAVENOUS
  Administered 2019-05-24: 40 mg via INTRAVENOUS
  Administered 2019-05-24: 20 mg via INTRAVENOUS

## 2019-05-24 MED ORDER — SODIUM CHLORIDE 0.9 % IR SOLN
Status: DC | PRN
  Administered 2019-05-24: 3000 mL

## 2019-05-24 MED ORDER — STERILE WATER FOR IRRIGATION IR SOLN
Status: DC | PRN
  Administered 2019-05-24: 1000 mL

## 2019-05-24 MED ORDER — METHOCARBAMOL 1000 MG/10ML IJ SOLN: 500.0000 mg | Freq: Four times a day (QID) | INTRAVENOUS | Status: DC | PRN

## 2019-05-24 MED ORDER — BUPIVACAINE HCL (PF) 0.25 % IJ SOLN
INTRAMUSCULAR | Status: AC
  Filled 2019-05-24: qty 30

## 2019-05-24 MED ORDER — 0.9 % SODIUM CHLORIDE (POUR BTL) OPTIME
TOPICAL | Status: DC | PRN
  Administered 2019-05-24 (×3): 1000 mL

## 2019-05-24 MED ORDER — LACTATED RINGERS IV SOLN
INTRAVENOUS | Status: DC | PRN
  Administered 2019-05-24: 07:00:00 via INTRAVENOUS

## 2019-05-24 MED ORDER — PHENOL 1.4 % MT LIQD: 1.0000 | OROMUCOSAL | Status: DC | PRN

## 2019-05-24 MED ORDER — ATORVASTATIN CALCIUM 10 MG PO TABS
20.0000 mg | ORAL_TABLET | Freq: Every day | ORAL | Status: DC
  Administered 2019-05-24 – 2019-05-25 (×2): 20 mg via ORAL
  Filled 2019-05-24 (×2): qty 2

## 2019-05-24 MED ORDER — BUPIVACAINE IN DEXTROSE 0.75-8.25 % IT SOLN
INTRATHECAL | Status: DC | PRN
  Administered 2019-05-24: 1.4 mL via INTRATHECAL

## 2019-05-24 MED ORDER — FENTANYL CITRATE (PF) 100 MCG/2ML IJ SOLN
25.0000 ug | INTRAMUSCULAR | Status: DC | PRN
  Administered 2019-05-24 (×3): 50 ug via INTRAVENOUS

## 2019-05-24 MED ORDER — METOCLOPRAMIDE HCL 5 MG/ML IJ SOLN: 5.0000 mg | Freq: Three times a day (TID) | INTRAMUSCULAR | Status: DC | PRN

## 2019-05-24 MED ORDER — FENTANYL CITRATE (PF) 250 MCG/5ML IJ SOLN
INTRAMUSCULAR | Status: DC | PRN
  Administered 2019-05-24 (×5): 50 ug via INTRAVENOUS

## 2019-05-24 MED ORDER — POVIDONE-IODINE 10 % EX SWAB: 2.0000 "application " | Freq: Once | CUTANEOUS | Status: DC

## 2019-05-24 MED ORDER — METHOCARBAMOL 500 MG PO TABS
ORAL_TABLET | ORAL | Status: AC
  Filled 2019-05-24: qty 1

## 2019-05-24 MED ORDER — MECLIZINE HCL 25 MG PO TABS
25.0000 mg | ORAL_TABLET | Freq: Three times a day (TID) | ORAL | Status: DC | PRN
  Filled 2019-05-24: qty 1

## 2019-05-24 MED ORDER — LACTATED RINGERS IV SOLN
INTRAVENOUS | Status: DC
  Administered 2019-05-24: 13:00:00 via INTRAVENOUS

## 2019-05-24 MED ORDER — SODIUM CHLORIDE 0.9% FLUSH
INTRAVENOUS | Status: DC | PRN
  Administered 2019-05-24 (×2): 10 mL

## 2019-05-24 MED ORDER — DOCUSATE SODIUM 100 MG PO CAPS
100.0000 mg | ORAL_CAPSULE | Freq: Two times a day (BID) | ORAL | Status: DC
  Administered 2019-05-24 – 2019-05-25 (×3): 100 mg via ORAL
  Filled 2019-05-24 (×3): qty 1

## 2019-05-24 MED ORDER — METOCLOPRAMIDE HCL 5 MG PO TABS
5.0000 mg | ORAL_TABLET | Freq: Three times a day (TID) | ORAL | Status: DC | PRN
  Administered 2019-05-24: 10 mg via ORAL
  Filled 2019-05-24: qty 2

## 2019-05-24 MED ORDER — ONDANSETRON HCL 4 MG PO TABS
4.0000 mg | ORAL_TABLET | Freq: Four times a day (QID) | ORAL | Status: DC | PRN
  Administered 2019-05-24: 4 mg via ORAL
  Filled 2019-05-24: qty 1

## 2019-05-24 MED ORDER — TRANEXAMIC ACID-NACL 1000-0.7 MG/100ML-% IV SOLN
INTRAVENOUS | Status: AC
  Filled 2019-05-24: qty 100

## 2019-05-24 MED ORDER — ROPIVACAINE HCL 5 MG/ML IJ SOLN
INTRAMUSCULAR | Status: DC | PRN
  Administered 2019-05-24: 20 mL via PERINEURAL

## 2019-05-24 MED ORDER — MORPHINE SULFATE (PF) 4 MG/ML IV SOLN
INTRAVENOUS | Status: AC
  Filled 2019-05-24: qty 2

## 2019-05-24 MED ORDER — PROPOFOL 10 MG/ML IV BOLUS
INTRAVENOUS | Status: AC
  Filled 2019-05-24: qty 40

## 2019-05-24 MED ORDER — ACETAMINOPHEN 500 MG PO TABS
1000.0000 mg | ORAL_TABLET | Freq: Once | ORAL | Status: AC
  Administered 2019-05-24: 1000 mg via ORAL
  Filled 2019-05-24: qty 2

## 2019-05-24 MED ORDER — HYDROMORPHONE HCL 1 MG/ML IJ SOLN
0.5000 mg | INTRAMUSCULAR | Status: DC | PRN
  Administered 2019-05-25 (×2): 0.5 mg via INTRAVENOUS
  Filled 2019-05-24 (×2): qty 1

## 2019-05-24 MED ORDER — FENTANYL CITRATE (PF) 250 MCG/5ML IJ SOLN
INTRAMUSCULAR | Status: AC
  Filled 2019-05-24: qty 5

## 2019-05-24 MED ORDER — OXYCODONE HCL 5 MG PO TABS
5.0000 mg | ORAL_TABLET | ORAL | Status: DC | PRN
  Administered 2019-05-24 – 2019-05-25 (×4): 5 mg via ORAL
  Filled 2019-05-24 (×3): qty 1

## 2019-05-24 SURGICAL SUPPLY — 86 items
BAG DECANTER FOR FLEXI CONT (MISCELLANEOUS) ×2 IMPLANT
BANDAGE ESMARK 6X9 LF (GAUZE/BANDAGES/DRESSINGS) ×1 IMPLANT
BLADE CLIPPER SURG (BLADE) ×2 IMPLANT
BLADE SAG 18X100X1.27 (BLADE) ×2 IMPLANT
BNDG COHESIVE 6X5 TAN STRL LF (GAUZE/BANDAGES/DRESSINGS) ×2 IMPLANT
BNDG ELASTIC 4X5.8 VLCR STR LF (GAUZE/BANDAGES/DRESSINGS) ×2 IMPLANT
BNDG ELASTIC 6X15 VLCR STRL LF (GAUZE/BANDAGES/DRESSINGS) IMPLANT
BNDG ELASTIC 6X5.8 VLCR STR LF (GAUZE/BANDAGES/DRESSINGS) ×2 IMPLANT
BNDG ESMARK 6X9 LF (GAUZE/BANDAGES/DRESSINGS) ×2
BOWL SMART MIX CTS (DISPOSABLE) IMPLANT
CLSR STERI-STRIP ANTIMIC 1/2X4 (GAUZE/BANDAGES/DRESSINGS) ×2 IMPLANT
COMPONENT TRI CR RETAIN SZ6 RT (Miscellaneous) ×1 IMPLANT
CONT SPEC 4OZ CLIKSEAL STRL BL (MISCELLANEOUS) ×2 IMPLANT
COVER SURGICAL LIGHT HANDLE (MISCELLANEOUS) ×2 IMPLANT
COVER WAND RF STERILE (DRAPES) ×2 IMPLANT
CUFF TOURN SGL QUICK 34 (TOURNIQUET CUFF) ×1
CUFF TOURN SGL QUICK 42 (TOURNIQUET CUFF) IMPLANT
CUFF TRNQT CYL 34X4.125X (TOURNIQUET CUFF) ×1 IMPLANT
DECANTER SPIKE VIAL GLASS SM (MISCELLANEOUS) ×2 IMPLANT
DRAPE INCISE IOBAN 66X45 STRL (DRAPES) IMPLANT
DRAPE ORTHO SPLIT 77X108 STRL (DRAPES) ×2
DRAPE SURG ORHT 6 SPLT 77X108 (DRAPES) ×2 IMPLANT
DRAPE U-SHAPE 47X51 STRL (DRAPES) ×2 IMPLANT
DRSG AQUACEL AG ADV 3.5X10 (GAUZE/BANDAGES/DRESSINGS) ×2 IMPLANT
DRSG AQUACEL AG ADV 3.5X14 (GAUZE/BANDAGES/DRESSINGS) IMPLANT
DURAPREP 26ML APPLICATOR (WOUND CARE) ×4 IMPLANT
ELECT CAUTERY BLADE 6.4 (BLADE) ×2 IMPLANT
ELECT REM PT RETURN 9FT ADLT (ELECTROSURGICAL) ×2
ELECTRODE REM PT RTRN 9FT ADLT (ELECTROSURGICAL) ×1 IMPLANT
GAUZE SPONGE 4X4 12PLY STRL (GAUZE/BANDAGES/DRESSINGS) ×2 IMPLANT
GAUZE SPONGE 4X4 12PLY STRL LF (GAUZE/BANDAGES/DRESSINGS) ×2 IMPLANT
GLOVE BIO SURGEON STRL SZ7.5 (GLOVE) ×2 IMPLANT
GLOVE BIOGEL PI IND STRL 7.5 (GLOVE) ×2 IMPLANT
GLOVE BIOGEL PI IND STRL 8 (GLOVE) ×1 IMPLANT
GLOVE BIOGEL PI INDICATOR 7.5 (GLOVE) ×2
GLOVE BIOGEL PI INDICATOR 8 (GLOVE) ×1
GLOVE ECLIPSE 7.0 STRL STRAW (GLOVE) ×2 IMPLANT
GLOVE SURG ORTHO 8.0 STRL STRW (GLOVE) ×2 IMPLANT
GOWN STRL REUS W/ TWL LRG LVL3 (GOWN DISPOSABLE) ×3 IMPLANT
GOWN STRL REUS W/TWL LRG LVL3 (GOWN DISPOSABLE) ×3
HANDPIECE INTERPULSE COAX TIP (DISPOSABLE) ×1
HOOD PEEL AWAY FLYTE STAYCOOL (MISCELLANEOUS) ×6 IMPLANT
IMMOBILIZER KNEE 20 (SOFTGOODS)
IMMOBILIZER KNEE 20 THIGH 36 (SOFTGOODS) IMPLANT
IMMOBILIZER KNEE 22 UNIV (SOFTGOODS) IMPLANT
IMMOBILIZER KNEE 24 THIGH 36 (MISCELLANEOUS) IMPLANT
IMMOBILIZER KNEE 24 UNIV (MISCELLANEOUS)
INSERT TIB BEARING 7X12 (Insert) ×2 IMPLANT
KIT BASIN OR (CUSTOM PROCEDURE TRAY) ×2 IMPLANT
KIT TURNOVER KIT B (KITS) ×2 IMPLANT
KNEE TIBIAL COMPONENT SZ7 (Knees) ×2 IMPLANT
MANIFOLD NEPTUNE II (INSTRUMENTS) ×2 IMPLANT
NEEDLE 18GX1X1/2 (RX/OR ONLY) (NEEDLE) ×2 IMPLANT
NEEDLE 22X1 1/2 (OR ONLY) (NEEDLE) ×4 IMPLANT
NEEDLE HYPO 25GX1X1/2 BEV (NEEDLE) ×4 IMPLANT
NEEDLE SPNL 18GX3.5 QUINCKE PK (NEEDLE) ×2 IMPLANT
NS IRRIG 1000ML POUR BTL (IV SOLUTION) ×4 IMPLANT
PACK TOTAL JOINT (CUSTOM PROCEDURE TRAY) ×2 IMPLANT
PAD ARMBOARD 7.5X6 YLW CONV (MISCELLANEOUS) ×4 IMPLANT
PAD CAST 4YDX4 CTTN HI CHSV (CAST SUPPLIES) ×1 IMPLANT
PADDING CAST COTTON 4X4 STRL (CAST SUPPLIES) ×1
PADDING CAST COTTON 6X4 STRL (CAST SUPPLIES) ×2 IMPLANT
PATELLA ASYMMETRIC 38X11 KNEE (Orthopedic Implant) ×2 IMPLANT
SET HNDPC FAN SPRY TIP SCT (DISPOSABLE) ×1 IMPLANT
STRIP CLOSURE SKIN 1/2X4 (GAUZE/BANDAGES/DRESSINGS) ×4 IMPLANT
SUCTION FRAZIER HANDLE 10FR (MISCELLANEOUS) ×1
SUCTION TUBE FRAZIER 10FR DISP (MISCELLANEOUS) ×1 IMPLANT
SUT MNCRL AB 3-0 PS2 18 (SUTURE) ×2 IMPLANT
SUT VIC AB 0 CT1 27 (SUTURE) ×3
SUT VIC AB 0 CT1 27XBRD ANBCTR (SUTURE) ×3 IMPLANT
SUT VIC AB 1 CT1 27 (SUTURE) ×7
SUT VIC AB 1 CT1 27XBRD ANBCTR (SUTURE) ×7 IMPLANT
SUT VIC AB 2-0 CT1 27 (SUTURE) ×4
SUT VIC AB 2-0 CT1 TAPERPNT 27 (SUTURE) ×4 IMPLANT
SUT VICRYL 0 UR6 27IN ABS (SUTURE) ×6 IMPLANT
SYR 20ML LL LF (SYRINGE) ×4 IMPLANT
SYR 30ML LL (SYRINGE) ×6 IMPLANT
SYR 5ML LUER SLIP (SYRINGE) ×2 IMPLANT
SYR INSULIN 1ML 31GX6 SAFETY (SYRINGE) ×2 IMPLANT
SYR TB 1ML LUER SLIP (SYRINGE) ×2 IMPLANT
TOWEL GREEN STERILE (TOWEL DISPOSABLE) ×4 IMPLANT
TOWEL GREEN STERILE FF (TOWEL DISPOSABLE) ×4 IMPLANT
TRAY CATH 16FR W/PLASTIC CATH (SET/KITS/TRAYS/PACK) IMPLANT
TRAY URETHRAL CATH 14FR LF (CATHETERS) ×2 IMPLANT
TRIATHLON CRUCIATE RETAIN SZ6 (Miscellaneous) ×2 IMPLANT
WATER STERILE IRR 1000ML POUR (IV SOLUTION) IMPLANT

## 2019-05-24 NOTE — Plan of Care (Signed)

## 2019-05-24 NOTE — Care Management Obs Status (Signed)
[facility] NOTIFICATION   Patient Details  Name: Cody Floyd MRN: [id number] Date of Birth: 1941/08/05   Medicare Observation Status Notification Given:  Yes    Bartholomew Crews, RN 05/24/2019, 4:47 PM

## 2019-05-24 NOTE — Progress Notes (Signed)
Patient stable Ankle dorsiflexion plantarflexion intact on the right Foot is perfused and sensate Okay to start CPM machine tonight at 9:00 as per the orders Okay for physical therapy. Anticipate discharge tomorrow afternoon per patient wishes.  He is very motivated.

## 2019-05-24 NOTE — Anesthesia Procedure Notes (Signed)
Anesthesia Regional Block: Adductor canal block   Pre-Anesthetic Checklist: ,, timeout performed, Correct Patient, Correct Site, Correct Laterality, Correct Procedure, Correct Position, site marked, Risks and benefits discussed,  Surgical consent,  Pre-op evaluation,  At surgeon's request and post-op pain management  Laterality: Right  Prep: Maximum Sterile Barrier Precautions used, chloraprep       Needles:  Injection technique: Single-shot  Needle Type: Echogenic Stimulator Needle     Needle Length: 9cm  Needle Gauge: 22     Additional Needles:   Procedures:,,,, ultrasound used (permanent image in chart),,,,  Narrative:  Start time: 05/24/2019 7:07 AM End time: 05/24/2019 7:17 AM Injection made incrementally with aspirations every 5 mL.  Performed by: Personally  Anesthesiologist: Freddrick March, MD  Additional Notes: Monitors applied. No increased pain on injection. No increased resistance to injection. Injection made in 5cc increments. Good needle visualization. Patient tolerated procedure well.

## 2019-05-24 NOTE — Care Management CC44 (Signed)
Condition Code 44 Documentation Completed  Patient Details  Name: THURMAN LAUMANN MRN: [id number] Date of Birth: 02/28/1941   Condition Code 44 given:  Yes Patient signature on Condition Code 44 notice:  Yes Documentation of 2 MD's agreement:  Yes Code 44 added to claim:  Yes    Bartholomew Crews, RN 05/24/2019, 4:47 PM

## 2019-05-24 NOTE — Op Note (Signed)
NAME: Cody Floyd, Cody Floyd MEDICAL RECORD [id number] ACCOUNT [id number] DATE OF BIRTH:12/09/1940 FACILITY: [facility] LOCATION: [facility] PHYSICIAN:GREGORY Randel Pigg, MD  OPERATIVE REPORT  DATE OF PROCEDURE:  05/24/2019  PREOPERATIVE DIAGNOSIS:  Right knee arthritis.  POSTOPERATIVE DIAGNOSIS:  Right knee arthritis.  PROCEDURE:  Right total knee replacement using Stryker press-fit components 6 femur, 7 tibia, 12 mm polyethylene insert, 38 mm 3-peg press-fit patella, cruciate retaining.  SURGEON:  Meredith Pel, MD  ASSISTANT:  Annie Main, PA-C   INDICATIONS:  This is a 78 year old patient with end-stage right knee arthritis refractory to nonoperative management who presents for operative management after explanation of risks and benefits.  PROCEDURE IN DETAIL:  The patient was brought to the operating room where spinal anesthetic was induced.  Preoperative antibiotics were administered.  Timeout was called.  Right leg prescrubbed with alcohol and Betadine, allowed to air dry, prepped with  DuraPrep solution, and draped in a sterile manner.  Charlie Pitter was used to cover the operative field.  The patient had about a 15-degree preoperative flexion contracture.  The leg was elevated and exsanguinated with the Esmarch wrap, tourniquet inflated for a  total time of 81 minutes.  Anterior approach to the knee was made.  Skin and subcutaneous tissue were sharply divided.  Median parapatellar approach was made and marked with a #1 Vicryl suture.  Soft tissue was removed from the anterior distal femur,  and the lateral patellofemoral ligament was released.  The partial dissection was performed medially of the soft tissue sleeve.  At this time, ACL was released.  Anterior horn of the lateral meniscus was released.  Fat pad partially excised.  Osteophytes  were removed.  Intramedullary alignment was then used to make a cut perpendicular to the mechanical axis with the collaterals and posterior neurovascular  structures well protected.  This cut was made comparable to the left knee, which was 11 mm off the  least-affected lateral tibial plateau.  Distal femoral cut was made in 5 degrees of valgus at 10 mm.  This was also comparable to the patient's left knee.  Both a 9 and 11 mm spacer fit well with full extension.  At this time, the femur was sized to a  size 6.  The tibia was sized to a 7.  The tibia was keel punched and prepared.  Femur was also prepared.  Trial components placed.  The patient had best stability and range of motion and excellent patellar tracking using an 11 mm spacer.  A 12 mm spacer  was chosen just for slightly increased stability in extension.  At this time, the patella was cut down from 26 to 15, and a 3-pegged patella trial was placed.  The patient had good stability, excellent patellar tracking, full range of motion, and no  issues with mid flexion instability.  Trial components were removed.  Thorough irrigation performed with 3 L of irrigating solution.  TXA topical was placed, along with Exparel solution into the capsule.  This was allowed to sit for 3 minutes.  This was  removed.  IrriSept solution was used at all times during the case for infection prevention, both after the incision as well as after the arthrotomy.  Components placed with a 12 mm spacer chosen.  This gave full extension, full flexion, with no liftoff,  and excellent patellar tracking.  Tourniquet was released.  Bleeding points encountered controlled using electrocautery.  Pouring irrigation again utilized.  The incision was then closed over a bolster using #1 Vicryl suture,  followed by interrupted  inverted 0 Vicryl suture, 2-0 Vicryl suture, and a 3-0 Monocryl.  A solution of Marcaine, morphine, clonidine, and topical TXA was placed into the knee after closure.  The Steri-Strips and an Aquacel dressing and Ace wrap and a knee immobilizer were  applied.  The patient tolerated the procedure well without immediate  complication.  Luke's assistance was required at all times for opening, closing, and retraction.  His assistance was a medical necessity.  LN/NUANCE  D:05/24/2019 T:05/24/2019 JOB:[id number]

## 2019-05-24 NOTE — Progress Notes (Signed)
Orthopedic Tech Progress Note Patient Details:  Cody Floyd October 16, 1940 [id number]  CPM Right Knee CPM Right Knee: On Right Knee Flexion (Degrees): 40 Right Knee Extension (Degrees): 10 Additional Comments: foot roll  Post Interventions Patient Tolerated: Well Instructions Provided: Care of device  Cody Floyd 05/24/2019, 11:22 AM

## 2019-05-24 NOTE — Anesthesia Procedure Notes (Deleted)
Epidural

## 2019-05-24 NOTE — Brief Op Note (Signed)
   05/24/2019  10:27 AM  PATIENT:  Cody Floyd  78 y.o. male  PRE-OPERATIVE DIAGNOSIS:  right knee osteoarthritis  POST-OPERATIVE DIAGNOSIS:  right knee osteoarthritis  PROCEDURE:  Procedure(s): RIGHT TOTAL KNEE ARTHROPLASTY  SURGEON:  Surgeon(s): Meredith Pel, MD  ASSISTANT: luke magnant pa  ANESTHESIA:   spinal  EBL: 100 ml    Total I/O In: 800 [I.V.:800] Out: 75 [Blood:75]  BLOOD ADMINISTERED: none  DRAINS: none   LOCAL MEDICATIONS USED: Marcaine morphine clonidine Exparel topical TXA  SPECIMEN:  No Specimen  COUNTS:  YES  TOURNIQUET:   Total Tourniquet Time Documented: Thigh (Right) - 81 minutes Total: Thigh (Right) - 81 minutes   DICTATION: .Other Dictation: Dictation Number [id number]  PLAN OF CARE: Admit to inpatient   PATIENT DISPOSITION:  PACU - hemodynamically stable

## 2019-05-24 NOTE — TOC Initial Note (Signed)
Transition of Care [facility]) - Initial/Assessment Note    Patient Details  Name: Cody Floyd MRN: [id number] Date of Birth: 19-Mar-1941  Transition [facility]) CM/SW Contact:    Bartholomew Crews, RN Phone Number: [phone] 05/24/2019, 4:56 PM  Clinical Narrative:                 Spoke with patient and spouse at bedside. PTA home with spouse. Right knee replacement today with anticipated transition home tomorrow. Patient and spouse verbalized 2 weeks HH before transitioning to outpatient, and that therapy would work with patient one more time in the morning before going home. Verified with Tiffany with [facility] of outpatient f/u. TOC following for transition needs.   Expected Discharge Plan: [place] Barriers to Discharge: Continued Medical Work up   Patient Goals and CMS Choice Patient states their goals for this hospitalization and ongoing recovery are:: home CMS Medicare.gov Compare Post Acute Care list provided to:: Patient    Expected Discharge Plan and Services Expected Discharge Plan: [place]   Discharge Planning Services: CM Consult                               HH Arranged: PT, OT [facility] Agency: [facility] Date [place]: 05/24/19 Time [place]: 1656 Representative spoke with at [place]: [place] Arrangements/Services   Lives with:: Self, Spouse Patient language and need for interpreter reviewed:: Yes        Need for Family Participation in Patient Care: Yes (Comment) Care giver support system in place?: Yes (comment)   Criminal Activity/Legal Involvement Pertinent to Current Situation/Hospitalization: No - Comment as needed  Activities of Daily Living      Permission Sought/Granted Permission sought to share information with : Family Supports Permission granted to share information with : Yes, Verbal Permission Granted              Emotional  Assessment Appearance:: Appears younger than stated age Attitude/Demeanor/Rapport: Engaged Affect (typically observed): Accepting Orientation: : Oriented to Situation, Oriented to  Time, Oriented to Place, Oriented to Self Alcohol / Substance Use: Not Applicable Psych Involvement: No (comment)  Admission diagnosis:  right knee osteoarthritis Patient Active Problem List   Diagnosis Date Noted  . Arthritis of right knee 05/24/2019  . Peripheral arterial disease ([place]) 03/08/2019  . Post PTCA 06/01/2018  . Angina pectoris ([place]) 05/31/2018  . Arthritis of knee 03/31/2017  . Spinal stenosis, lumbar region, with neurogenic claudication 05/29/2014    Class: Chronic  . Left lumbar radiculopathy 05/29/2014    Class: Chronic   PCP:  Jani Gravel, MD Pharmacy:   [facility], [address] [place] [address] Phone: [phone] Fax: [place], [place] - [address] AT [address] [place] [address] Phone: [phone] Fax: [phone]     Social Determinants of Health (SDOH) Interventions    Readmission Risk Interventions No flowsheet data found.

## 2019-05-24 NOTE — Transfer of Care (Signed)
Immediate Anesthesia Transfer of Care Note  Patient: Cody Floyd  Procedure(s) Performed: RIGHT TOTAL KNEE ARTHROPLASTY (Right Knee)  Patient Location: PACU  Anesthesia Type:MAC, Regional and Spinal  Level of Consciousness: awake, alert  and oriented  Airway & Oxygen Therapy: Patient Spontanous Breathing and Patient connected to nasal cannula oxygen  Post-op Assessment: Report given to RN, Post -op Vital signs reviewed and stable and Patient moving all extremities  Post vital signs: Reviewed and stable  Last Vitals:  Vitals Value Taken Time  BP    Temp 36.1 C 05/24/19 1041  Pulse 60 05/24/19 1051  Resp 16 05/24/19 1051  SpO2 96 % 05/24/19 1051  Vitals shown include unvalidated device data.  Last Pain:  Vitals:   05/24/19 0649  TempSrc:   PainSc: 0-No pain         Complications: No apparent anesthesia complications

## 2019-05-24 NOTE — Anesthesia Procedure Notes (Signed)
Procedure Name: MAC Date/Time: 05/24/2019 7:28 AM Performed by: Scheryl Darter, CRNA Pre-anesthesia Checklist: Emergency Drugs available, Patient identified, Suction available, Patient being monitored and Timeout performed Patient Re-evaluated:Patient Re-evaluated prior to induction Oxygen Delivery Method: Simple face mask Placement Confirmation: positive ETCO2 Dental Injury: Teeth and Oropharynx as per pre-operative assessment

## 2019-05-24 NOTE — Anesthesia Procedure Notes (Signed)
Spinal  Patient location during procedure: OR Start time: 05/24/2019 7:45 AM End time: 05/24/2019 7:55 AM Staffing Anesthesiologist: Freddrick March, MD Performed: anesthesiologist  Preanesthetic Checklist Completed: patient identified, surgical consent, pre-op evaluation, timeout performed, IV checked, risks and benefits discussed and monitors and equipment checked Spinal Block Patient position: sitting Prep: site prepped and draped and DuraPrep Patient monitoring: cardiac monitor, continuous pulse ox and blood pressure Approach: midline Location: L3-4 Injection technique: single-shot Needle Needle type: Pencan  Needle gauge: 24 G Needle length: 9 cm Assessment Sensory level: T6 Additional Notes Functioning IV was confirmed and monitors were applied. Sterile prep and drape, including hand hygiene and sterile gloves were used. The patient was positioned and the spine was prepped. The skin was anesthetized with lidocaine.  Free flow of clear CSF was obtained prior to injecting local anesthetic into the CSF.  The spinal needle aspirated freely following injection.  The needle was carefully withdrawn.  The patient tolerated the procedure well.

## 2019-05-24 NOTE — Evaluation (Signed)
Physical Therapy Evaluation Patient Details Name: Cody Floyd MRN: [id number] DOB: August 12, 1941 Today's Date: 05/24/2019   History of Present Illness  Pt is a 78 y.o. male s/p elective R TKA 05/24/19. PMH includes vertigo, RLS, LBP, HTN, arthritis, L TKA (2018).  Clinical Impression  Pt presents with an overall decrease in functional mobility secondary to above. PTA, pt independent and lives with supportive wife. Educ on precautions, positioning, therex, and importance of mobility. Today, pt able to initiate transfer and gait training with RW; pt moving well and very motivated for return home tomorrow. Pt would benefit from continued acute PT services to maximize functional mobility and independence prior to d/c with HHPT services.     Follow Up Recommendations Follow surgeon's recommendation for DC plan and follow-up therapies;Home health PT    Equipment Recommendations  None recommended by PT    Recommendations for Other Services       Precautions / Restrictions Precautions Precautions: Knee;Fall Precaution Comments: Verbally reviewed precautions Restrictions Weight Bearing Restrictions: Yes RLE Weight Bearing: Weight bearing as tolerated      Mobility  Bed Mobility Overal bed mobility: Modified Independent             General bed mobility comments: HOB elevated  Transfers Overall transfer level: Needs assistance Equipment used: Rolling walker (2 wheeled) Transfers: Sit to/from Stand Sit to Stand: Min assist         General transfer comment: MinA from wife HHA for trunk elevation to RW; able to stand from toilet with use of grab bar, supervision  Ambulation/Gait Ambulation/Gait assistance: Min guard Gait Distance (Feet): 40 Feet Assistive device: Rolling walker (2 wheeled) Gait Pattern/deviations: Step-through pattern;Decreased weight shift to right;Antalgic Gait velocity: Decreased   General Gait Details: Slow, antalgic steps with RW, min guard for balance;  cues to maintain closer proximity to [employer]    Modified Rankin (Stroke Patients Only)       Balance Overall balance assessment: Needs assistance   Sitting balance-Leahy Scale: Good       Standing balance-Leahy Scale: Poor                               Pertinent Vitals/Pain Pain Assessment: Faces Faces Pain Scale: Hurts little more Pain Location: R knee Pain Descriptors / Indicators: Guarding;Grimacing Pain Intervention(s): Monitored during session    Home Living Family/patient expects to be discharged to:: Private residence Living Arrangements: Spouse/significant other Available Help at Discharge: Family;Available 24 hours/day Type of Home: House Home Access: Level entry(in back)     Home Layout: One level Home Equipment: Shower seat;Bedside commode;Walker - 2 wheels      Prior Function Level of Independence: Independent         Comments: Sometimes needing assist from wife for boost to stand     Hand Dominance        Extremity/Trunk Assessment   Upper Extremity Assessment Upper Extremity Assessment: Overall WFL for tasks assessed    Lower Extremity Assessment Lower Extremity Assessment: RLE deficits/detail RLE Deficits / Details: s/p R TKA; hip and knee at least 3/5, pt able to perform SLR and LAQ; active knee flexion to ~80'       Communication   Communication: HOH  Cognition Arousal/Alertness: Awake/alert Behavior During Therapy: WFL for tasks assessed/performed Overall Cognitive Status: Within Functional Limits for tasks assessed  General Comments General comments (skin integrity, edema, etc.): Wife present and supportive    Exercises Total Joint Exercises Long Arc Quad: AROM;Right;Seated Knee Flexion: AROM;Right;Seated Marching in Standing: AROM;Right;Seated   Assessment/Plan    PT Assessment Patient needs continued PT  services  PT Problem List Decreased strength;Decreased range of motion;Decreased activity tolerance;Decreased balance;Decreased mobility;Decreased knowledge of use of DME;Decreased knowledge of precautions;Pain       PT Treatment Interventions DME instruction;Gait training;Stair training;Functional mobility training;Therapeutic activities    PT Goals (Current goals can be found in the Care Plan section)  Acute Rehab PT Goals Patient Stated Goal: Return home tomorrow PT Goal Formulation: With patient/family Time For Goal Achievement: 06/07/19 Potential to Achieve Goals: Good    Frequency 7X/week   Barriers to discharge        Co-evaluation               AM-PAC PT "6 Clicks" Mobility  Outcome Measure Help needed turning from your back to your side while in a flat bed without using bedrails?: None Help needed moving from lying on your back to sitting on the side of a flat bed without using bedrails?: A Little Help needed moving to and from a bed to a chair (including a wheelchair)?: A Little Help needed standing up from a chair using your arms (e.g., wheelchair or bedside chair)?: A Little Help needed to walk in hospital room?: A Little Help needed climbing 3-5 steps with a railing? : A Little 6 Click Score: 19    End of Session Equipment Utilized During Treatment: Gait belt Activity Tolerance: Patient tolerated treatment well Patient left: in chair;with call bell/phone within reach;with chair alarm set;with family/visitor present Nurse Communication: Mobility status PT Visit Diagnosis: Other abnormalities of gait and mobility (R26.89);Pain Pain - Right/Left: Right Pain - part of body: Knee    Time: [id number] PT Time Calculation (min) (ACUTE ONLY): 22 min   Charges:   PT Evaluation $PT Eval Moderate Complexity: 1 Mod     Cody Floyd, PT, DPT Acute Rehabilitation Services  Pager [phone] Office [phone]  Derry Lory 05/24/2019, 5:32 PM

## 2019-05-24 NOTE — H&P (Signed)
TOTAL KNEE ADMISSION H&P  Patient is being admitted for right total knee arthroplasty.  Subjective:  Chief Complaint:right knee pain.  HPI: Cody Floyd, 78 y.o. male, has a history of pain and functional disability in the right knee due to arthritis and has failed non-surgical conservative treatments for greater than 12 weeks to includeNSAID's and/or analgesics, corticosteriod injections, viscosupplementation injections, flexibility and strengthening excercises and activity modification.  Onset of symptoms was gradual, starting >10 years ago with gradually worsening course since that time. The patient noted no past surgery on the right knee(s).  Patient currently rates pain in the right knee(s) at 9 out of 10 with activity. Patient has night pain, worsening of pain with activity and weight bearing, pain that interferes with activities of daily living, pain with passive range of motion, crepitus and joint swelling.  Patient has evidence of subchondral sclerosis and joint space narrowing by imaging studies. This patient has had A good result with his left total knee replacement.. There is no active infection.  Patient Active Problem List   Diagnosis Date Noted  . Peripheral arterial disease ([place]) 03/08/2019  . Post PTCA 06/01/2018  . Angina pectoris ([place]) 05/31/2018  . Arthritis of knee 03/31/2017  . Spinal stenosis, lumbar region, with neurogenic claudication 05/29/2014    Class: Chronic  . Left lumbar radiculopathy 05/29/2014    Class: Chronic   Past Medical History:  Diagnosis Date  . Arthritis    "a little all over my body" (06/01/2018)  . Chronic lower back pain   . GERD (gastroesophageal reflux disease)   . History of BPH   . Hyperlipidemia   . Hypertension   . RLS (restless legs syndrome)   . Ventral hernia   . Vertigo     Past Surgical History:  Procedure Laterality Date  . BACK SURGERY    . CORONARY ANGIOPLASTY WITH STENT PLACEMENT  06/01/2018  . CORONARY STENT  INTERVENTION N/A 06/01/2018   Procedure: CORONARY STENT INTERVENTION;  Surgeon: Adrian Prows, MD;  Location: [facility];  Service: Cardiovascular;  Laterality: N/A;  . HAMMER TOE SURGERY Left 2012  . JOINT REPLACEMENT    . LEFT HEART CATH AND CORONARY ANGIOGRAPHY N/A 06/01/2018   Procedure: LEFT HEART CATH AND CORONARY ANGIOGRAPHY;  Surgeon: Adrian Prows, MD;  Location: [facility];  Service: Cardiovascular;  Laterality: N/A;  . LUMBAR [place]     "I had 2 other lower back ORs before the one in 2015"  . LUMBAR LAMINECTOMY/DECOMPRESSION MICRODISCECTOMY N/A 05/29/2014   Procedure: CENTRAL LUMBAR LAMINECTOMY L3-4 and L4-5;  Surgeon: Jessy Oto, MD;  Location: [facility];  Service: Orthopedics;  Laterality: N/A;  . TOTAL KNEE ARTHROPLASTY Left 03/31/2017   Procedure: LEFT TOTAL KNEE ARTHROPLASTY;  Surgeon: Meredith Pel, MD;  Location: [facility];  Service: Orthopedics;  Laterality: Left;    Current Facility-Administered Medications  Medication Dose Route Frequency Provider Last Rate Last Dose  . bupivacaine liposome (EXPAREL) 1.3 % injection 266 mg  20 mL Other To OR Marlou Sa Tonna Corner, MD      . ceFAZolin (ANCEF) 3 g in dextrose 5 % 50 mL IVPB  3 g Intravenous To SS-Surg Meredith Pel, MD      . chlorhexidine (HIBICLENS) 4 % liquid 4 application  60 mL Topical Once Magnant, Charles L, PA-C      . chlorhexidine (HIBICLENS) 4 % liquid 4 application  60 mL Topical Once Magnant, Charles L, PA-C      .  povidone-iodine 10 % swab 2 application  2 application Topical Once Magnant, Charles L, PA-C      . tranexamic acid (CYKLOKAPRON) 2,000 mg in sodium chloride 0.9 % 50 mL Topical Application  [id number] mg Topical To OR Meredith Pel, MD      . tranexamic acid (CYKLOKAPRON) IVPB 1,000 mg  1,000 mg Intravenous To OR Marlou Sa Tonna Corner, MD       No Known Allergies  Social History   Tobacco Use  . Smoking status: Never Smoker  . Smokeless tobacco: Never Used  Substance Use Topics   . Alcohol use: Not Currently    Comment: 06/01/2018  "probably have 3 beers/month"    Family History  Problem Relation Age of Onset  . Osteoarthritis Mother      Review of Systems  Musculoskeletal: Positive for joint pain.  All other systems reviewed and are negative.   Objective:  Physical Exam  Constitutional: He appears well-developed.  HENT:  Head: Normocephalic.  Eyes: Pupils are equal, round, and reactive to light.  Neck: Normal range of motion.  Cardiovascular: Normal rate.  Respiratory: Effort normal.  Neurological: He is alert.  Skin: Skin is warm.  Psychiatric: He has a normal mood and affect.    Vital signs in last 24 hours: Temp:  [97.8 F (36.6 C)] 97.8 F (36.6 C) (09/29 0548) Pulse Rate:  [77] 77 (09/29 0548) Resp:  [20] 20 (09/29 0548) BP: (187)/(78) 187/78 (09/29 0553) SpO2:  [97 %] 97 % (09/29 0548) Weight:  [110.7 kg] 110.7 kg (09/29 0649)  Labs:   Estimated body mass index is 32.19 kg/m as calculated from the following:   Height as of this encounter: 6\' 1"  (1.854 m).   Weight as of this encounter: 110.7 kg.   Imaging Review Plain radiographs demonstrate severe degenerative joint disease of the right knee(s). The overall alignment ismild varus. The bone quality appears to be good for age and reported activity level.      Assessment/Plan:  End stage arthritis, right knee   The patient history, physical examination, clinical judgment of the provider and imaging studies are consistent with end stage degenerative joint disease of the right knee(s) and total knee arthroplasty is deemed medically necessary. The treatment options including medical management, injection therapy arthroscopy and arthroplasty were discussed at length. The risks and benefits of total knee arthroplasty were presented and reviewed. The risks due to aseptic loosening, infection, stiffness, patella tracking problems, thromboembolic complications and other imponderables  were discussed. The patient acknowledged the explanation, agreed to proceed with the plan and consent was signed. Patient is being admitted for inpatient treatment for surgery, pain control, PT, OT, prophylactic antibiotics, VTE prophylaxis, progressive ambulation and ADL's and discharge planning. The patient is planning to be discharged home with home health services    Anticipated LOS equal to or greater than 2 midnights due to - Age 74 and older with one or more of the following:  - Obesity  - Expected need for hospital services (PT, OT, Nursing) required for safe  discharge  - Anticipated need for postoperative skilled nursing care or inpatient rehab  - Active co-morbidities: Coronary Artery Disease OR   - Unanticipated findings during/Post Surgery: None  - Patient is a high risk of re-admission due to: None

## 2019-05-25 ENCOUNTER — Encounter (HOSPITAL_COMMUNITY): Payer: Self-pay | Admitting: Orthopedic Surgery

## 2019-05-25 DIAGNOSIS — I739 Peripheral vascular disease, unspecified: Secondary | ICD-10-CM | POA: Diagnosis not present

## 2019-05-25 DIAGNOSIS — Z96652 Presence of left artificial knee joint: Secondary | ICD-10-CM | POA: Diagnosis not present

## 2019-05-25 DIAGNOSIS — Z79899 Other long term (current) drug therapy: Secondary | ICD-10-CM | POA: Diagnosis not present

## 2019-05-25 DIAGNOSIS — E785 Hyperlipidemia, unspecified: Secondary | ICD-10-CM | POA: Diagnosis not present

## 2019-05-25 DIAGNOSIS — G2581 Restless legs syndrome: Secondary | ICD-10-CM | POA: Diagnosis not present

## 2019-05-25 DIAGNOSIS — I25119 Atherosclerotic heart disease of native coronary artery with unspecified angina pectoris: Secondary | ICD-10-CM | POA: Diagnosis not present

## 2019-05-25 DIAGNOSIS — Z96651 Presence of right artificial knee joint: Secondary | ICD-10-CM | POA: Diagnosis not present

## 2019-05-25 DIAGNOSIS — Z955 Presence of coronary angioplasty implant and graft: Secondary | ICD-10-CM | POA: Diagnosis not present

## 2019-05-25 DIAGNOSIS — K219 Gastro-esophageal reflux disease without esophagitis: Secondary | ICD-10-CM | POA: Diagnosis not present

## 2019-05-25 DIAGNOSIS — M1711 Unilateral primary osteoarthritis, right knee: Secondary | ICD-10-CM | POA: Diagnosis not present

## 2019-05-25 DIAGNOSIS — I10 Essential (primary) hypertension: Secondary | ICD-10-CM | POA: Diagnosis not present

## 2019-05-25 DIAGNOSIS — D759 Disease of blood and blood-forming organs, unspecified: Secondary | ICD-10-CM | POA: Diagnosis not present

## 2019-05-25 DIAGNOSIS — Z7902 Long term (current) use of antithrombotics/antiplatelets: Secondary | ICD-10-CM | POA: Diagnosis not present

## 2019-05-25 DIAGNOSIS — Z7982 Long term (current) use of aspirin: Secondary | ICD-10-CM | POA: Diagnosis not present

## 2019-05-25 MED ORDER — TIZANIDINE HCL 2 MG PO TABS: 2.0000 mg | ORAL_TABLET | Freq: Three times a day (TID) | ORAL | 0 refills | Status: DC | PRN

## 2019-05-25 MED ORDER — OXYCODONE HCL 5 MG PO TABS: 5.0000 mg | ORAL_TABLET | Freq: Four times a day (QID) | ORAL | 0 refills | Status: DC | PRN

## 2019-05-25 NOTE — Progress Notes (Signed)
Nsg Discharge Note  Admit Date:  05/24/2019 Discharge date: 05/25/2019   NAKYE BRANER to be D/C'd home per MD order.  AVS completed. Patient/caregiver able to verbalize understanding.  Discharge Medication: Allergies as of 05/25/2019   No Known Allergies     Medication List    TAKE these medications   aspirin EC 81 MG tablet Take 81 mg by mouth daily.   atorvastatin 20 MG tablet Commonly known as: LIPITOR TAKE 1 TABLET BY MOUTH DAILY   clopidogrel 75 MG tablet Commonly known as: PLAVIX Take 1 tablet (75 mg total) by mouth daily with breakfast.   meclizine 25 MG tablet Commonly known as: ANTIVERT Take 25 mg by mouth 3 (three) times daily as needed for dizziness.   nitroGLYCERIN 0.4 MG SL tablet Commonly known as: NITROSTAT Place 0.4 mg under the tongue every 5 (five) minutes as needed for chest pain.   oxyCODONE 5 MG immediate release tablet Commonly known as: Oxy IR/ROXICODONE Take 1 tablet (5 mg total) by mouth every 6 (six) hours as needed for moderate pain (pain score 4-6).   pantoprazole 40 MG tablet Commonly known as: PROTONIX Take 1 tablet (40 mg total) by mouth daily.   pramipexole 0.5 MG tablet Commonly known as: MIRAPEX Take 0.5 mg by mouth at bedtime.   terazosin 5 MG capsule Commonly known as: HYTRIN Take 5 mg by mouth at bedtime.   tiZANidine 2 MG tablet Commonly known as: ZANAFLEX Take 1 tablet (2 mg total) by mouth every 8 (eight) hours as needed.       Discharge Assessment: Vitals:   05/25/19 0806 05/25/19 1422  BP: (!) 156/67 (!) 154/74  Pulse: 82 80  Resp: 16 16  Temp: 97.8 F (36.6 C) 97.7 F (36.5 C)  SpO2: 95% 98%   Skin clean, dry and intact without evidence of skin break down, no evidence of skin tears noted. IV catheter discontinued intact. Site without signs and symptoms of complications - no redness or edema noted at insertion site, patient denies c/o pain - only slight tenderness at site.  Dressing with slight pressure  applied.  D/c Instructions-Education: Discharge instructions given to patient/family with verbalized understanding. D/c education completed with patient/family including follow up instructions, medication list, d/c activities limitations if indicated, with other d/c instructions as indicated by MD - patient able to verbalize understanding, all questions fully answered. Patient instructed to return to ED, call 911, or call MD for any changes in condition.  Patient escorted via [place], and D/C home via private auto.  Atilano Ina, RN 05/25/2019 5:04 PM  Nsg Discharge Note

## 2019-05-25 NOTE — Progress Notes (Signed)
  Subjective: Cody Floyd is a 78 y.o. male s/p right TKA.  They are POD1.  Pt's pain is controlled.  Pt denies numbness/tingling/weakness.  Pt has ambulated with some difficulty. Did well in PT yesterday.    Objective: Vital signs in last 24 hours: Temp:  [97 F (36.1 C)-98.9 F (37.2 C)] 97.8 F (36.6 C) (09/30 0806) Pulse Rate:  [55-101] 82 (09/30 0806) Resp:  [12-18] 16 (09/30 0806) BP: (138-178)/(67-81) 156/67 (09/30 0806) SpO2:  [93 %-100 %] 95 % (09/30 0806)  Intake/Output from previous day: 09/29 0701 - 09/30 0700 In: 2633.6 [P.O.:480; I.V.:2053.6; IV Piggyback:100] Out: 4075 [Urine:3950; Blood:125] Intake/Output this shift: No intake/output data recorded.  Exam:  No gross blood or drainage overlying the dressing 1+ DP pulse Sensation intact distally in the right foot Able to dorsiflex and plantarflex the right foot   Labs: No results for input(s): HGB in the last 72 hours. No results for input(s): WBC, RBC, HCT, PLT in the last 72 hours. No results for input(s): NA, K, CL, CO2, BUN, CREATININE, GLUCOSE, CALCIUM in the last 72 hours. No results for input(s): LABPT, INR in the last 72 hours.  Assessment/Plan: Pt is POD1 s/p right TKA.    -Plan to discharge to home likely today pending patient's pain and PT eval  -WBAT with a walker  -Okay to shower, dressing is waterproof.  Cautioned patient against soaking dressing in bath/pool/body of water  -Encouraged the use of the blue cradle boot to work on extension.  Cautioned patient against using a pillow under their knee.  -Use the CPM machine at least 3 times per day for one hour each time, increasing the degrees daily.     Cody Floyd L Pocahontas Cohenour 05/25/2019, 8:13 AM

## 2019-05-25 NOTE — Anesthesia Postprocedure Evaluation (Signed)
Anesthesia Post Note  Patient: KAYLEB BAGGARLY  Procedure(s) Performed: RIGHT TOTAL KNEE ARTHROPLASTY (Right Knee)     Patient location during evaluation: PACU Anesthesia Type: Regional and Spinal Level of consciousness: oriented and awake and alert Pain management: pain level controlled Vital Signs Assessment: post-procedure vital signs reviewed and stable Respiratory status: spontaneous breathing, respiratory function stable and patient connected to nasal cannula oxygen Cardiovascular status: blood pressure returned to baseline and stable Postop Assessment: no headache, no backache and no apparent nausea or vomiting Anesthetic complications: no    Last Vitals:  Vitals:   05/25/19 0300 05/25/19 0806  BP: (!) 144/77 (!) 156/67  Pulse: 79 82  Resp: 16 16  Temp: 37 C 36.6 C  SpO2: 98% 95%    Last Pain:  Vitals:   05/25/19 0949  TempSrc:   PainSc: 8                  [place]

## 2019-05-25 NOTE — Progress Notes (Signed)
Physical Therapy Treatment Patient Details Name: Cody Floyd MRN: [id number] DOB: 1941/01/30 Today's Date: 05/25/2019    History of Present Illness Pt is a 78 y.o. male s/p elective R TKA 05/24/19. PMH includes vertigo, RLS, LBP, HTN, arthritis, L TKA (2018).    PT Comments    Pt performed gt training and functional mobility with min to min guard assistance.  Pt is progressing slowly and limited due to pain in R LE.  He is eager to return home.  Pt placed in bone foam post session and educated on knee precautions.  Ice pack applied to R knee.     Follow Up Recommendations  Follow surgeon's recommendation for DC plan and follow-up therapies;Home health PT     Equipment Recommendations  None recommended by PT    Recommendations for Other Services       Precautions / Restrictions Precautions Precautions: Knee;Fall Precaution Comments: Verbally reviewed precautions Restrictions Weight Bearing Restrictions: Yes RLE Weight Bearing: Weight bearing as tolerated    Mobility  Bed Mobility               General bed mobility comments: Pt seated in recliner on arrival.  Transfers Overall transfer level: Needs assistance Equipment used: Rolling walker (2 wheeled) Transfers: Sit to/from Stand Sit to Stand: Min assist         General transfer comment: Pt required cues for body position and forward weight shifting to come to standing.  He started to throw his weight backward and require min assistance to correct and come to standing.  Ambulation/Gait Ambulation/Gait assistance: Min guard Gait Distance (Feet): 70 Feet Assistive device: Rolling walker (2 wheeled) Gait Pattern/deviations: Step-through pattern;Decreased weight shift to right;Antalgic     General Gait Details: Slow, antalgic steps with RW, min guard for balance; cues to maintain closer proximity to RW.  Cues to push down through RW when standing vs. pushing device forward.   Stairs              Wheelchair Mobility    Modified Rankin (Stroke Patients Only)       Balance Overall balance assessment: Needs assistance   Sitting balance-Leahy Scale: Good       Standing balance-Leahy Scale: Poor                              Cognition Arousal/Alertness: Awake/alert Behavior During Therapy: WFL for tasks assessed/performed Overall Cognitive Status: Within Functional Limits for tasks assessed                                        Exercises General Exercises - Lower Extremity Ankle Circles/Pumps: AROM;Both;20 reps;Supine Quad Sets: AROM;Right;10 reps;Supine Long Arc Quad: AROM;Right;10 reps;Seated Heel Slides: AROM;Right;10 reps;Supine Hip ABduction/ADduction: AROM;Right;10 reps;Supine Straight Leg Raises: AAROM;Right;10 reps;Supine    General Comments        Pertinent Vitals/Pain Pain Assessment: Faces Faces Pain Scale: Hurts little more Pain Location: R knee Pain Descriptors / Indicators: Guarding;Grimacing Pain Intervention(s): Monitored during session;Repositioned    Home Living                      Prior Function            PT Goals (current goals can now be found in the care plan section) Acute Rehab PT Goals Patient Stated Goal: Return home tomorrow  PT Goal Formulation: With patient/family Potential to Achieve Goals: Good    Frequency    7X/week      PT Plan Current plan remains appropriate    Co-evaluation              AM-PAC PT "6 Clicks" Mobility   Outcome Measure  Help needed turning from your back to your side while in a flat bed without using bedrails?: None Help needed moving from lying on your back to sitting on the side of a flat bed without using bedrails?: A Little Help needed moving to and from a bed to a chair (including a wheelchair)?: A Little Help needed standing up from a chair using your arms (e.g., wheelchair or bedside chair)?: A Little Help needed to walk in hospital  room?: A Little Help needed climbing 3-5 steps with a railing? : A Little 6 Click Score: 19    End of Session Equipment Utilized During Treatment: Gait belt Activity Tolerance: Patient tolerated treatment well Patient left: in chair;with call bell/phone within reach;with chair alarm set;with family/visitor present Nurse Communication: Mobility status PT Visit Diagnosis: Other abnormalities of gait and mobility (R26.89);Pain Pain - Right/Left: Right Pain - part of body: Knee     Time: [id number] PT Time Calculation (min) (ACUTE ONLY): 25 min  Charges:  $Gait Training: 8-22 mins $Therapeutic Exercise: 8-22 mins                     Governor Rooks, PTA Acute Rehabilitation Services Pager [phone] Office [phone]     Marabeth Melland Eli Hose 05/25/2019, 10:38 AM

## 2019-05-25 NOTE — Progress Notes (Signed)
Physical Therapy Treatment Patient Details Name: Cody Floyd MRN: [id number] DOB: 04/28/1941 Today's Date: 05/25/2019    History of Present Illness Pt is a 78 y.o. male s/p elective R TKA 05/24/19. PMH includes vertigo, RLS, LBP, HTN, arthritis, L TKA (2018).    PT Comments    Pt followed up for pm session with progression of gt training distance.  He still requires minimal assistance for transfers as he continues to lack forward weight shifting.  Pt is ready from a mobility stand point to d/c home.  Plan for f/u HHPT.  RN and MD/PA informed of patient's readiness to d/c home.    Follow Up Recommendations  Follow surgeon's recommendation for DC plan and follow-up therapies;Home health PT     Equipment Recommendations  None recommended by PT    Recommendations for Other Services       Precautions / Restrictions Precautions Precautions: Knee;Fall Precaution Comments: Verbally reviewed precautions Restrictions Weight Bearing Restrictions: Yes RLE Weight Bearing: Weight bearing as tolerated    Mobility  Bed Mobility Overal bed mobility: Modified Independent             General bed mobility comments: Pt able to move out of bed and back into bed without assistance,  Transfers Overall transfer level: Needs assistance Equipment used: Rolling walker (2 wheeled) Transfers: Sit to/from Stand Sit to Stand: Min assist         General transfer comment: Cues for sequencing and body position to move to edge of bed and transfer weight forward to achieve stand.  Ambulation/Gait Ambulation/Gait assistance: Min guard Gait Distance (Feet): 120 Feet Assistive device: Rolling walker (2 wheeled) Gait Pattern/deviations: Step-through pattern;Decreased weight shift to right;Antalgic Gait velocity: Decreased   General Gait Details: Cues for R weight shifting to improve L foot clearance.   Stairs             Wheelchair Mobility    Modified Rankin (Stroke Patients Only)       Balance Overall balance assessment: Needs assistance   Sitting balance-Leahy Scale: Good       Standing balance-Leahy Scale: Poor                              Cognition Arousal/Alertness: Awake/alert Behavior During Therapy: WFL for tasks assessed/performed Overall Cognitive Status: Within Functional Limits for tasks assessed                                        Exercises Total Joint Exercises Ankle Circles/Pumps: AROM;20 reps;Both;Supine Quad Sets: AROM General Exercises - Lower Extremity Ankle Circles/Pumps: AROM;Both;20 reps;Supine Quad Sets: AROM;Right;10 reps;Supine Long Arc Quad: AROM;Right;10 reps;Seated Heel Slides: AROM;Right;10 reps;Supine Hip ABduction/ADduction: AROM;Right;10 reps;Supine Straight Leg Raises: AAROM;Right;10 reps;Supine    General Comments        Pertinent Vitals/Pain Pain Assessment: Faces Faces Pain Scale: Hurts even more Pain Location: R knee Pain Descriptors / Indicators: Guarding;Grimacing Pain Intervention(s): Monitored during session;Repositioned    Home Living                      Prior Function            PT Goals (current goals can now be found in the care plan section) Acute Rehab PT Goals Patient Stated Goal: Return home tomorrow PT Goal Formulation: With patient/family Potential to Achieve Goals: Good  Progress towards PT goals: Progressing toward goals    Frequency    7X/week      PT Plan Current plan remains appropriate    Co-evaluation              AM-PAC PT "6 Clicks" Mobility   Outcome Measure  Help needed turning from your back to your side while in a flat bed without using bedrails?: None Help needed moving from lying on your back to sitting on the side of a flat bed without using bedrails?: A Little Help needed moving to and from a bed to a chair (including a wheelchair)?: A Little Help needed standing up from a chair using your arms (e.g.,  wheelchair or bedside chair)?: A Little Help needed to walk in hospital room?: A Little Help needed climbing 3-5 steps with a railing? : A Little 6 Click Score: 19    End of Session Equipment Utilized During Treatment: Gait belt Activity Tolerance: Patient tolerated treatment well Patient left: in chair;with call bell/phone within reach;with chair alarm set;with family/visitor present Nurse Communication: Mobility status PT Visit Diagnosis: Other abnormalities of gait and mobility (R26.89);Pain Pain - Right/Left: Right Pain - part of body: Knee     Time: 1331-1355 PT Time Calculation (min) (ACUTE ONLY): 24 min  Charges:  $Gait Training: 8-22 mins $Therapeutic Exercise: 8-22 mins                     Governor Rooks, PTA Acute Rehabilitation Services Pager [phone] Office [phone]     Robbye Dede Eli Hose 05/25/2019, 2:08 PM

## 2019-05-27 ENCOUNTER — Telehealth: Payer: Self-pay | Admitting: Orthopedic Surgery

## 2019-05-27 DIAGNOSIS — K439 Ventral hernia without obstruction or gangrene: Secondary | ICD-10-CM | POA: Diagnosis not present

## 2019-05-27 DIAGNOSIS — M48062 Spinal stenosis, lumbar region with neurogenic claudication: Secondary | ICD-10-CM | POA: Diagnosis not present

## 2019-05-27 DIAGNOSIS — Z471 Aftercare following joint replacement surgery: Secondary | ICD-10-CM | POA: Diagnosis not present

## 2019-05-27 DIAGNOSIS — E785 Hyperlipidemia, unspecified: Secondary | ICD-10-CM | POA: Diagnosis not present

## 2019-05-27 DIAGNOSIS — K219 Gastro-esophageal reflux disease without esophagitis: Secondary | ICD-10-CM | POA: Diagnosis not present

## 2019-05-27 DIAGNOSIS — M5416 Radiculopathy, lumbar region: Secondary | ICD-10-CM | POA: Diagnosis not present

## 2019-05-27 DIAGNOSIS — Z955 Presence of coronary angioplasty implant and graft: Secondary | ICD-10-CM | POA: Diagnosis not present

## 2019-05-27 DIAGNOSIS — I739 Peripheral vascular disease, unspecified: Secondary | ICD-10-CM | POA: Diagnosis not present

## 2019-05-27 DIAGNOSIS — Z7902 Long term (current) use of antithrombotics/antiplatelets: Secondary | ICD-10-CM | POA: Diagnosis not present

## 2019-05-27 DIAGNOSIS — G2581 Restless legs syndrome: Secondary | ICD-10-CM | POA: Diagnosis not present

## 2019-05-27 DIAGNOSIS — I2510 Atherosclerotic heart disease of native coronary artery without angina pectoris: Secondary | ICD-10-CM | POA: Diagnosis not present

## 2019-05-27 DIAGNOSIS — Z7982 Long term (current) use of aspirin: Secondary | ICD-10-CM | POA: Diagnosis not present

## 2019-05-27 DIAGNOSIS — Z9181 History of falling: Secondary | ICD-10-CM | POA: Diagnosis not present

## 2019-05-27 DIAGNOSIS — Z96653 Presence of artificial knee joint, bilateral: Secondary | ICD-10-CM | POA: Diagnosis not present

## 2019-05-27 DIAGNOSIS — I10 Essential (primary) hypertension: Secondary | ICD-10-CM | POA: Diagnosis not present

## 2019-05-27 NOTE — Telephone Encounter (Signed)
Cody Floyd from [facility], called. She would like verbal orders for PT 1x wk 1 wk, 3x wk 2 wk

## 2019-05-27 NOTE — Telephone Encounter (Signed)
I left voicemail for Verdis Frederickson giving verbal orders.

## 2019-05-27 NOTE — Discharge Summary (Signed)
Physician Discharge Summary      Patient ID: Cody Floyd MRN: [id number] DOB/AGE: 78-Mar-1942 78 y.o.  Admit date: 05/24/2019 Discharge date: 05/25/2019  Admission Diagnoses:  Active Problems:   Arthritis of right knee   Discharge Diagnoses:  Same  Surgeries: Procedure(s): RIGHT TOTAL KNEE ARTHROPLASTY on 05/24/2019   Consultants:   Discharged Condition: Stable  Hospital Course: Cody Floyd is an 78 y.o. male who was admitted 05/24/2019 with a chief complaint of right knee pain, and found to have a diagnosis of right knee osteoarthritis.  They were brought to the operating room on 05/24/2019 and underwent the above named procedures.  Pt awoke from anesthesia without complication and was transferred to the floor. On POD1, patient mobilized well in PT and his pain was controlled.  Pt was discharged on POD1.  Pt will f/u with Dr. Marlou Sa in clinic in ~2 weeks.   Antibiotics given:  Anti-infectives (From admission, onward)   Start     Dose/Rate Route Frequency Ordered Stop   05/24/19 1330  ceFAZolin (ANCEF) IVPB 2g/100 mL premix     2 g 200 mL/hr over 30 Minutes Intravenous Every 6 hours 05/24/19 1202 05/24/19 2217   05/24/19 0630  ceFAZolin (ANCEF) 3 g in dextrose 5 % 50 mL IVPB     3 g 100 mL/hr over 30 Minutes Intravenous To ShortStay [facility] 05/23/19 1337 05/24/19 0728    .  Recent vital signs:  Vitals:   05/25/19 0806 05/25/19 1422  BP: (!) 156/67 (!) 154/74  Pulse: 82 80  Resp: 16 16  Temp: 97.8 F (36.6 C) 97.7 F (36.5 C)  SpO2: 95% 98%    Recent laboratory studies:  Results for orders placed or performed during the hospital encounter of 05/20/19  Novel Coronavirus, NAA (Hosp order, Send-out to Ref Lab; TAT 18-24 hrs   Specimen: Nasopharyngeal Swab; Respiratory  Result Value Ref Range   SARS-CoV-2, NAA NOT DETECTED NOT DETECTED   Coronavirus Source NASOPHARYNGEAL     Discharge Medications:   Allergies as of 05/25/2019   No Known Allergies     Medication  List    TAKE these medications   aspirin EC 81 MG tablet Take 81 mg by mouth daily.   atorvastatin 20 MG tablet Commonly known as: LIPITOR TAKE 1 TABLET BY MOUTH DAILY   clopidogrel 75 MG tablet Commonly known as: PLAVIX Take 1 tablet (75 mg total) by mouth daily with breakfast.   meclizine 25 MG tablet Commonly known as: ANTIVERT Take 25 mg by mouth 3 (three) times daily as needed for dizziness.   nitroGLYCERIN 0.4 MG SL tablet Commonly known as: NITROSTAT Place 0.4 mg under the tongue every 5 (five) minutes as needed for chest pain.   oxyCODONE 5 MG immediate release tablet Commonly known as: Oxy IR/ROXICODONE Take 1 tablet (5 mg total) by mouth every 6 (six) hours as needed for moderate pain (pain score 4-6).   pantoprazole 40 MG tablet Commonly known as: PROTONIX Take 1 tablet (40 mg total) by mouth daily.   pramipexole 0.5 MG tablet Commonly known as: MIRAPEX Take 0.5 mg by mouth at bedtime.   terazosin 5 MG capsule Commonly known as: HYTRIN Take 5 mg by mouth at bedtime.   tiZANidine 2 MG tablet Commonly known as: ZANAFLEX Take 1 tablet (2 mg total) by mouth every 8 (eight) hours as needed.       Diagnostic Studies: Pcv Carotid Duplex (bilateral)  Result Date: 05/19/2019 Carotid artery duplex  05/19/19 No  hemodynamically significant stenosis noted in bilateral internal carotid arteries. Mild diffuse heterogeneous plaque noted. Antegrade right vertebral artery flow. Antegrade left vertebral artery flow. Compared ot 05/13/2018, bilateral 15-49% stenosis not present.   Disposition:   Discharge Instructions    Call MD / Call 911   Complete by: As directed    If you experience chest pain or shortness of breath, CALL 911 and be transported to the hospital emergency room.  If you develope a fever above 101 F, pus (white drainage) or increased drainage or redness at the wound, or calf pain, call your surgeon's office.   Constipation Prevention   Complete by: As  directed    Drink plenty of fluids.  Prune juice may be helpful.  You may use a stool softener, such as Colace (over the counter) 100 mg twice a day.  Use MiraLax (over the counter) for constipation as needed.   Diet - low sodium heart healthy   Complete by: As directed    Discharge instructions   Complete by: As directed    You may shower, dressing is waterproof.  Do not remove the dressing, we will remove it at your first post-op appointment.  Do not take a bath or soak the knee in a tub or pool.  You may weightbear as you can tolerate on the operative leg with a walker.  Continue using the CPM machine 3 times per day for at least one hour each time, increasing the degrees of range of motion daily.  Use the blue cradle boot or a pillow under your heel to work on getting your leg straight.  Do NOT put a pillow under your knee.  You will follow-up with Dr. Marlou Sa in the clinic in 1-2 weeks at your given appointment date.   Increase activity slowly as tolerated   Complete by: As directed          Signed: Donella Stade 05/27/2019, 6:04 PM

## 2019-05-27 NOTE — Telephone Encounter (Signed)
Cody Floyd

## 2019-05-30 ENCOUNTER — Ambulatory Visit: Payer: Self-pay

## 2019-05-30 ENCOUNTER — Ambulatory Visit (INDEPENDENT_AMBULATORY_CARE_PROVIDER_SITE_OTHER): Payer: Medicare Other | Admitting: Orthopedic Surgery

## 2019-05-30 ENCOUNTER — Encounter: Payer: Self-pay | Admitting: Orthopedic Surgery

## 2019-05-30 ENCOUNTER — Telehealth: Payer: Self-pay | Admitting: Orthopedic Surgery

## 2019-05-30 DIAGNOSIS — Z96651 Presence of right artificial knee joint: Secondary | ICD-10-CM

## 2019-05-30 MED ORDER — HYDROCODONE-ACETAMINOPHEN 5-325 MG PO TABS: 1.0000 | ORAL_TABLET | Freq: Four times a day (QID) | ORAL | 0 refills | Status: DC | PRN

## 2019-05-30 NOTE — Telephone Encounter (Signed)
Made appt for this afternoon

## 2019-05-30 NOTE — Progress Notes (Signed)
Post-Op Visit Note   Patient: Cody Floyd           Date of Birth: [birth year]           MRN: [id number] Visit Date: 05/30/2019 PCP: Jani Gravel, MD   Assessment & Plan:  Chief Complaint:  Chief Complaint  Patient presents with  . Right Knee - Routine Post Op   Visit Diagnoses:  1. Status post total right knee replacement     Plan: Patient is a 78 year old male who presents s/p right total knee arthroplasty on 05/24/2019.  Patient states that he is doing well.  He is taking oxycodone every 8 hours.  He is ambulating with a walker and has been having home health physical therapy.  Patient notes significant nausea but denies any fevers or chills.  He is at 105 degrees on the CPM machine.  On exam patient has some appropriate amount of laxity in extension.  This should continue to improve with time, and as his muscles strengthen with therapy.  He is at about 5 degrees of extension and greater than 90 degrees of flexion.  His x-rays look good today in clinic.  No calf tenderness.  Negative Homans sign.  His incision is healing except for 1 spot proximally that is been bleeding a little bit.  The spot was covered with benzoin and Steri-Strips.  Patient will hold off on the CPM machine for 2 days before resuming it.  Prescribed Norco to take instead of oxycodone; hopefully this will help with his nausea.  Patient will follow-up in about 1 week.  He will restart his Plavix.  Follow-Up Instructions: No follow-ups on file.   Orders:  Orders Placed This Encounter  Procedures  . XR Knee 1-2 Views Right   No orders of the defined types were placed in this encounter.   Imaging: No results found.  PMFS History: Patient Active Problem List   Diagnosis Date Noted  . Arthritis of right knee 05/24/2019  . Peripheral arterial disease ([place]) 03/08/2019  . Post PTCA 06/01/2018  . Angina pectoris ([place]) 05/31/2018  . Arthritis of knee 03/31/2017  . Spinal stenosis, lumbar region, with neurogenic  claudication 05/29/2014    Class: Chronic  . Left lumbar radiculopathy 05/29/2014    Class: Chronic   Past Medical History:  Diagnosis Date  . Arthritis    "a little all over my body" (06/01/2018)  . Chronic lower back pain   . GERD (gastroesophageal reflux disease)   . History of BPH   . Hyperlipidemia   . Hypertension   . RLS (restless legs syndrome)   . Ventral hernia   . Vertigo     Family History  Problem Relation Age of Onset  . Osteoarthritis Mother     Past Surgical History:  Procedure Laterality Date  . BACK SURGERY    . CORONARY ANGIOPLASTY WITH STENT PLACEMENT  06/01/2018  . CORONARY STENT INTERVENTION N/A 06/01/2018   Procedure: CORONARY STENT INTERVENTION;  Surgeon: Adrian Prows, MD;  Location: [facility];  Service: Cardiovascular;  Laterality: N/A;  . HAMMER TOE SURGERY Left 2012  . JOINT REPLACEMENT    . LEFT HEART CATH AND CORONARY ANGIOGRAPHY N/A 06/01/2018   Procedure: LEFT HEART CATH AND CORONARY ANGIOGRAPHY;  Surgeon: Adrian Prows, MD;  Location: [facility];  Service: Cardiovascular;  Laterality: N/A;  . LUMBAR [place]     "I had 2 other lower back ORs before the one in 2015"  . LUMBAR  LAMINECTOMY/DECOMPRESSION MICRODISCECTOMY N/A 05/29/2014   Procedure: CENTRAL LUMBAR LAMINECTOMY L3-4 and L4-5;  Surgeon: Jessy Oto, MD;  Location: [facility];  Service: Orthopedics;  Laterality: N/A;  . TOTAL KNEE ARTHROPLASTY Left 03/31/2017   Procedure: LEFT TOTAL KNEE ARTHROPLASTY;  Surgeon: Meredith Pel, MD;  Location: [facility];  Service: Orthopedics;  Laterality: Left;  . 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 Smoking status: Never Smoker  . Smokeless tobacco: Never Used  Substance and Sexual Activity  .  Alcohol use: Not Currently    Comment: 06/01/2018  "probably have 3 beers/month"  . Drug use: Never  . Sexual activity: Not Currently

## 2019-05-30 NOTE — Telephone Encounter (Signed)
Pt called in said he just had surgery with dr.dean 05-24-2019 and he is concerned that when he is walking and doing the exercises that the knee is in a lot of pain and he believes the knee popped out of place.   [phone]

## 2019-05-30 NOTE — Telephone Encounter (Signed)
Please call him and work him in today.

## 2019-05-31 DIAGNOSIS — G2581 Restless legs syndrome: Secondary | ICD-10-CM | POA: Diagnosis not present

## 2019-05-31 DIAGNOSIS — Z7902 Long term (current) use of antithrombotics/antiplatelets: Secondary | ICD-10-CM | POA: Diagnosis not present

## 2019-05-31 DIAGNOSIS — K219 Gastro-esophageal reflux disease without esophagitis: Secondary | ICD-10-CM | POA: Diagnosis not present

## 2019-05-31 DIAGNOSIS — Z955 Presence of coronary angioplasty implant and graft: Secondary | ICD-10-CM | POA: Diagnosis not present

## 2019-05-31 DIAGNOSIS — Z7982 Long term (current) use of aspirin: Secondary | ICD-10-CM | POA: Diagnosis not present

## 2019-05-31 DIAGNOSIS — Z9181 History of falling: Secondary | ICD-10-CM | POA: Diagnosis not present

## 2019-05-31 DIAGNOSIS — Z96653 Presence of artificial knee joint, bilateral: Secondary | ICD-10-CM | POA: Diagnosis not present

## 2019-05-31 DIAGNOSIS — M5416 Radiculopathy, lumbar region: Secondary | ICD-10-CM | POA: Diagnosis not present

## 2019-05-31 DIAGNOSIS — I2510 Atherosclerotic heart disease of native coronary artery without angina pectoris: Secondary | ICD-10-CM | POA: Diagnosis not present

## 2019-05-31 DIAGNOSIS — Z471 Aftercare following joint replacement surgery: Secondary | ICD-10-CM | POA: Diagnosis not present

## 2019-05-31 DIAGNOSIS — K439 Ventral hernia without obstruction or gangrene: Secondary | ICD-10-CM | POA: Diagnosis not present

## 2019-05-31 DIAGNOSIS — I10 Essential (primary) hypertension: Secondary | ICD-10-CM | POA: Diagnosis not present

## 2019-05-31 DIAGNOSIS — I739 Peripheral vascular disease, unspecified: Secondary | ICD-10-CM | POA: Diagnosis not present

## 2019-05-31 DIAGNOSIS — M48062 Spinal stenosis, lumbar region with neurogenic claudication: Secondary | ICD-10-CM | POA: Diagnosis not present

## 2019-05-31 DIAGNOSIS — E785 Hyperlipidemia, unspecified: Secondary | ICD-10-CM | POA: Diagnosis not present

## 2019-06-02 DIAGNOSIS — Z7902 Long term (current) use of antithrombotics/antiplatelets: Secondary | ICD-10-CM | POA: Diagnosis not present

## 2019-06-02 DIAGNOSIS — K439 Ventral hernia without obstruction or gangrene: Secondary | ICD-10-CM | POA: Diagnosis not present

## 2019-06-02 DIAGNOSIS — Z955 Presence of coronary angioplasty implant and graft: Secondary | ICD-10-CM | POA: Diagnosis not present

## 2019-06-02 DIAGNOSIS — E785 Hyperlipidemia, unspecified: Secondary | ICD-10-CM | POA: Diagnosis not present

## 2019-06-02 DIAGNOSIS — M48062 Spinal stenosis, lumbar region with neurogenic claudication: Secondary | ICD-10-CM | POA: Diagnosis not present

## 2019-06-02 DIAGNOSIS — M5416 Radiculopathy, lumbar region: Secondary | ICD-10-CM | POA: Diagnosis not present

## 2019-06-02 DIAGNOSIS — I739 Peripheral vascular disease, unspecified: Secondary | ICD-10-CM | POA: Diagnosis not present

## 2019-06-02 DIAGNOSIS — I10 Essential (primary) hypertension: Secondary | ICD-10-CM | POA: Diagnosis not present

## 2019-06-02 DIAGNOSIS — Z471 Aftercare following joint replacement surgery: Secondary | ICD-10-CM | POA: Diagnosis not present

## 2019-06-02 DIAGNOSIS — Z96653 Presence of artificial knee joint, bilateral: Secondary | ICD-10-CM | POA: Diagnosis not present

## 2019-06-02 DIAGNOSIS — K219 Gastro-esophageal reflux disease without esophagitis: Secondary | ICD-10-CM | POA: Diagnosis not present

## 2019-06-02 DIAGNOSIS — Z9181 History of falling: Secondary | ICD-10-CM | POA: Diagnosis not present

## 2019-06-02 DIAGNOSIS — Z7982 Long term (current) use of aspirin: Secondary | ICD-10-CM | POA: Diagnosis not present

## 2019-06-02 DIAGNOSIS — I2510 Atherosclerotic heart disease of native coronary artery without angina pectoris: Secondary | ICD-10-CM | POA: Diagnosis not present

## 2019-06-02 DIAGNOSIS — G2581 Restless legs syndrome: Secondary | ICD-10-CM | POA: Diagnosis not present

## 2019-06-03 DIAGNOSIS — G2581 Restless legs syndrome: Secondary | ICD-10-CM | POA: Diagnosis not present

## 2019-06-03 DIAGNOSIS — K219 Gastro-esophageal reflux disease without esophagitis: Secondary | ICD-10-CM | POA: Diagnosis not present

## 2019-06-03 DIAGNOSIS — I2510 Atherosclerotic heart disease of native coronary artery without angina pectoris: Secondary | ICD-10-CM | POA: Diagnosis not present

## 2019-06-03 DIAGNOSIS — Z96653 Presence of artificial knee joint, bilateral: Secondary | ICD-10-CM | POA: Diagnosis not present

## 2019-06-03 DIAGNOSIS — Z471 Aftercare following joint replacement surgery: Secondary | ICD-10-CM | POA: Diagnosis not present

## 2019-06-03 DIAGNOSIS — M5416 Radiculopathy, lumbar region: Secondary | ICD-10-CM | POA: Diagnosis not present

## 2019-06-03 DIAGNOSIS — Z955 Presence of coronary angioplasty implant and graft: Secondary | ICD-10-CM | POA: Diagnosis not present

## 2019-06-03 DIAGNOSIS — I739 Peripheral vascular disease, unspecified: Secondary | ICD-10-CM | POA: Diagnosis not present

## 2019-06-03 DIAGNOSIS — I10 Essential (primary) hypertension: Secondary | ICD-10-CM | POA: Diagnosis not present

## 2019-06-03 DIAGNOSIS — K439 Ventral hernia without obstruction or gangrene: Secondary | ICD-10-CM | POA: Diagnosis not present

## 2019-06-03 DIAGNOSIS — M48062 Spinal stenosis, lumbar region with neurogenic claudication: Secondary | ICD-10-CM | POA: Diagnosis not present

## 2019-06-03 DIAGNOSIS — Z9181 History of falling: Secondary | ICD-10-CM | POA: Diagnosis not present

## 2019-06-03 DIAGNOSIS — Z7902 Long term (current) use of antithrombotics/antiplatelets: Secondary | ICD-10-CM | POA: Diagnosis not present

## 2019-06-03 DIAGNOSIS — E785 Hyperlipidemia, unspecified: Secondary | ICD-10-CM | POA: Diagnosis not present

## 2019-06-03 DIAGNOSIS — Z7982 Long term (current) use of aspirin: Secondary | ICD-10-CM | POA: Diagnosis not present

## 2019-06-06 DIAGNOSIS — I10 Essential (primary) hypertension: Secondary | ICD-10-CM | POA: Diagnosis not present

## 2019-06-06 DIAGNOSIS — K439 Ventral hernia without obstruction or gangrene: Secondary | ICD-10-CM | POA: Diagnosis not present

## 2019-06-06 DIAGNOSIS — Z7982 Long term (current) use of aspirin: Secondary | ICD-10-CM | POA: Diagnosis not present

## 2019-06-06 DIAGNOSIS — K219 Gastro-esophageal reflux disease without esophagitis: Secondary | ICD-10-CM | POA: Diagnosis not present

## 2019-06-06 DIAGNOSIS — I739 Peripheral vascular disease, unspecified: Secondary | ICD-10-CM | POA: Diagnosis not present

## 2019-06-06 DIAGNOSIS — E785 Hyperlipidemia, unspecified: Secondary | ICD-10-CM | POA: Diagnosis not present

## 2019-06-06 DIAGNOSIS — Z471 Aftercare following joint replacement surgery: Secondary | ICD-10-CM | POA: Diagnosis not present

## 2019-06-06 DIAGNOSIS — G2581 Restless legs syndrome: Secondary | ICD-10-CM | POA: Diagnosis not present

## 2019-06-06 DIAGNOSIS — M5416 Radiculopathy, lumbar region: Secondary | ICD-10-CM | POA: Diagnosis not present

## 2019-06-06 DIAGNOSIS — Z9181 History of falling: Secondary | ICD-10-CM | POA: Diagnosis not present

## 2019-06-06 DIAGNOSIS — M48062 Spinal stenosis, lumbar region with neurogenic claudication: Secondary | ICD-10-CM | POA: Diagnosis not present

## 2019-06-06 DIAGNOSIS — I2510 Atherosclerotic heart disease of native coronary artery without angina pectoris: Secondary | ICD-10-CM | POA: Diagnosis not present

## 2019-06-06 DIAGNOSIS — Z955 Presence of coronary angioplasty implant and graft: Secondary | ICD-10-CM | POA: Diagnosis not present

## 2019-06-06 DIAGNOSIS — Z96653 Presence of artificial knee joint, bilateral: Secondary | ICD-10-CM | POA: Diagnosis not present

## 2019-06-06 DIAGNOSIS — Z7902 Long term (current) use of antithrombotics/antiplatelets: Secondary | ICD-10-CM | POA: Diagnosis not present

## 2019-06-07 DIAGNOSIS — I739 Peripheral vascular disease, unspecified: Secondary | ICD-10-CM | POA: Diagnosis not present

## 2019-06-07 DIAGNOSIS — Z955 Presence of coronary angioplasty implant and graft: Secondary | ICD-10-CM | POA: Diagnosis not present

## 2019-06-07 DIAGNOSIS — M48062 Spinal stenosis, lumbar region with neurogenic claudication: Secondary | ICD-10-CM | POA: Diagnosis not present

## 2019-06-07 DIAGNOSIS — Z7982 Long term (current) use of aspirin: Secondary | ICD-10-CM | POA: Diagnosis not present

## 2019-06-07 DIAGNOSIS — Z9181 History of falling: Secondary | ICD-10-CM | POA: Diagnosis not present

## 2019-06-07 DIAGNOSIS — E785 Hyperlipidemia, unspecified: Secondary | ICD-10-CM | POA: Diagnosis not present

## 2019-06-07 DIAGNOSIS — G2581 Restless legs syndrome: Secondary | ICD-10-CM | POA: Diagnosis not present

## 2019-06-07 DIAGNOSIS — I2510 Atherosclerotic heart disease of native coronary artery without angina pectoris: Secondary | ICD-10-CM | POA: Diagnosis not present

## 2019-06-07 DIAGNOSIS — Z7902 Long term (current) use of antithrombotics/antiplatelets: Secondary | ICD-10-CM | POA: Diagnosis not present

## 2019-06-07 DIAGNOSIS — K439 Ventral hernia without obstruction or gangrene: Secondary | ICD-10-CM | POA: Diagnosis not present

## 2019-06-07 DIAGNOSIS — M5416 Radiculopathy, lumbar region: Secondary | ICD-10-CM | POA: Diagnosis not present

## 2019-06-07 DIAGNOSIS — I10 Essential (primary) hypertension: Secondary | ICD-10-CM | POA: Diagnosis not present

## 2019-06-07 DIAGNOSIS — Z471 Aftercare following joint replacement surgery: Secondary | ICD-10-CM | POA: Diagnosis not present

## 2019-06-07 DIAGNOSIS — K219 Gastro-esophageal reflux disease without esophagitis: Secondary | ICD-10-CM | POA: Diagnosis not present

## 2019-06-07 DIAGNOSIS — Z96653 Presence of artificial knee joint, bilateral: Secondary | ICD-10-CM | POA: Diagnosis not present

## 2019-06-08 ENCOUNTER — Ambulatory Visit (INDEPENDENT_AMBULATORY_CARE_PROVIDER_SITE_OTHER): Payer: Medicare Other | Admitting: Orthopedic Surgery

## 2019-06-08 ENCOUNTER — Other Ambulatory Visit: Payer: Self-pay | Admitting: Surgical

## 2019-06-08 ENCOUNTER — Encounter: Payer: Self-pay | Admitting: Orthopedic Surgery

## 2019-06-08 ENCOUNTER — Other Ambulatory Visit: Payer: Self-pay

## 2019-06-08 VITALS — Ht 73.0 in | Wt 244.0 lb

## 2019-06-08 DIAGNOSIS — Z96651 Presence of right artificial knee joint: Secondary | ICD-10-CM

## 2019-06-08 MED ORDER — TIZANIDINE HCL 2 MG PO TABS: 2.0000 mg | ORAL_TABLET | Freq: Three times a day (TID) | ORAL | 0 refills | Status: DC | PRN

## 2019-06-08 MED ORDER — OXYCODONE HCL 5 MG PO TABS: 5.0000 mg | ORAL_TABLET | Freq: Three times a day (TID) | ORAL | 0 refills | Status: DC | PRN

## 2019-06-08 NOTE — Progress Notes (Signed)
Post-Op Visit Note   Patient: Cody Floyd           Date of Birth: 06-Feb-1941           MRN: [id number] Visit Date: 06/08/2019 PCP: Jani Gravel, MD   Assessment & Plan:  Chief Complaint:  Chief Complaint  Patient presents with  . Right Knee - Follow-up    05/24/2019 Right TKA   Visit Diagnoses:  1. Status post total right knee replacement     Plan: Cody Floyd is now 2 weeks out right total knee replacement.  On exam he has full extension flexion 105.  Incision is intact.  His get very good stability in flexion.  Had a preop varus deformity and had slight laxity with varus testing but that is only about 3 to 4 mm in full extension.  All in all his leg strength looks good.  He wants to avoid physical therapy.  No calf tenderness today.  Refill oxycodone refill Robaxin 4-week return for final clinical recheck  Follow-Up Instructions: Return in about 4 weeks (around 07/06/2019).   Orders:  No orders of the defined types were placed in this encounter.  No orders of the defined types were placed in this encounter.   Imaging: No results found.  PMFS History: Patient Active Problem List   Diagnosis Date Noted  . Arthritis of right knee 05/24/2019  . Peripheral arterial disease ([place]) 03/08/2019  . Post PTCA 06/01/2018  . Angina pectoris ([place]) 05/31/2018  . Arthritis of knee 03/31/2017  . Spinal stenosis, lumbar region, with neurogenic claudication 05/29/2014    Class: Chronic  . Left lumbar radiculopathy 05/29/2014    Class: Chronic   Past Medical History:  Diagnosis Date  . Arthritis    "a little all over my body" (06/01/2018)  . Chronic lower back pain   . GERD (gastroesophageal reflux disease)   . History of BPH   . Hyperlipidemia   . Hypertension   . RLS (restless legs syndrome)   . Ventral hernia   . Vertigo     Family History  Problem Relation Age of Onset  . Osteoarthritis Mother     Past Surgical History:  Procedure Laterality Date  . BACK SURGERY    .  CORONARY ANGIOPLASTY WITH STENT PLACEMENT  06/01/2018  . CORONARY STENT INTERVENTION N/A 06/01/2018   Procedure: CORONARY STENT INTERVENTION;  Surgeon: Adrian Prows, MD;  Location: [facility];  Service: Cardiovascular;  Laterality: N/A;  . HAMMER TOE SURGERY Left 2012  . JOINT REPLACEMENT    . LEFT HEART CATH AND CORONARY ANGIOGRAPHY N/A 06/01/2018   Procedure: LEFT HEART CATH AND CORONARY ANGIOGRAPHY;  Surgeon: Adrian Prows, MD;  Location: [facility];  Service: Cardiovascular;  Laterality: N/A;  . LUMBAR [place]     "I had 2 other lower back ORs before the one in 2015"  . LUMBAR LAMINECTOMY/DECOMPRESSION MICRODISCECTOMY N/A 05/29/2014   Procedure: CENTRAL LUMBAR LAMINECTOMY L3-4 and L4-5;  Surgeon: Jessy Oto, MD;  Location: [facility];  Service: Orthopedics;  Laterality: N/A;  . TOTAL KNEE ARTHROPLASTY Left 03/31/2017   Procedure: LEFT TOTAL KNEE ARTHROPLASTY;  Surgeon: Meredith Pel, MD;  Location: [facility];  Service: Orthopedics;  Laterality: Left;  . 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 Smoking status: Never Smoker  . Smokeless tobacco: Never Used  Substance and Sexual Activity  . Alcohol use: Not Currently    Comment: 06/01/2018  "probably have 3 beers/month"  . Drug use: Never  . Sexual activity: Not Currently

## 2019-07-08 ENCOUNTER — Ambulatory Visit: Payer: Medicare Other | Admitting: Orthopedic Surgery

## 2019-07-08 ENCOUNTER — Encounter: Payer: Self-pay | Admitting: Orthopedic Surgery

## 2019-07-08 ENCOUNTER — Other Ambulatory Visit: Payer: Self-pay

## 2019-07-08 DIAGNOSIS — Z96651 Presence of right artificial knee joint: Secondary | ICD-10-CM

## 2019-07-08 MED ORDER — OXYCODONE HCL 5 MG PO TABS: 5.0000 mg | ORAL_TABLET | Freq: Every day | ORAL | 0 refills | Status: DC | PRN

## 2019-07-08 NOTE — Progress Notes (Signed)
Post-Op Visit Note   Patient: Cody Floyd           Date of Birth: April 03, 1941           MRN: [id number] Visit Date: 07/08/2019 PCP: Jani Gravel, MD   Assessment & Plan:  Chief Complaint:  Chief Complaint  Patient presents with  . Right Knee - Follow-up   Visit Diagnoses:  1. Status post total right knee replacement     Plan: Patient is a 78 year old male who presents s/p right total knee arthroplasty on 05/24/2019.  Patient is doing well overall and ambulating with a cane.  He notes some discomfort with going from a sitting position to a standing position.  He notes that his knee feels unstable at times but he is able to walk without too much discomfort.  He has been using a knee sleeve which he states makes his knee feel more comfortable.  He is also using a stationary bike 3 times a day for count of 100.  He is doing squats, lifts to work on quad strength.  He is not in physical therapy, per his choice.  Is taking Tylenol for pain.  He only uses oxycodone occasionally.  Will refill oxycodone for 1 last time.  On exam he has 0 to 5 degrees of extension with 105 degrees of flexion.  He does have slight laxity to valgus stress in extension and 30 degrees of flexion on the right knee compared to the left.  He has no mid flexion instability.  Patient is doing well overall and should continue to improve as this quad strength builds up.  Patient will return to the office in 6 weeks.  Follow-Up Instructions: No follow-ups on file.   Orders:  No orders of the defined types were placed in this encounter.  No orders of the defined types were placed in this encounter.   Imaging: No results found.  PMFS History: Patient Active Problem List   Diagnosis Date Noted  . Arthritis of right knee 05/24/2019  . Peripheral arterial disease ([place]) 03/08/2019  . Post PTCA 06/01/2018  . Angina pectoris ([place]) 05/31/2018  . Arthritis of knee 03/31/2017  . Spinal stenosis, lumbar region, with  neurogenic claudication 05/29/2014    Class: Chronic  . Left lumbar radiculopathy 05/29/2014    Class: Chronic   Past Medical History:  Diagnosis Date  . Arthritis    "a little all over my body" (06/01/2018)  . Chronic lower back pain   . GERD (gastroesophageal reflux disease)   . History of BPH   . Hyperlipidemia   . Hypertension   . RLS (restless legs syndrome)   . Ventral hernia   . Vertigo     Family History  Problem Relation Age of Onset  . Osteoarthritis Mother     Past Surgical History:  Procedure Laterality Date  . BACK SURGERY    . CORONARY ANGIOPLASTY WITH STENT PLACEMENT  06/01/2018  . CORONARY STENT INTERVENTION N/A 06/01/2018   Procedure: CORONARY STENT INTERVENTION;  Surgeon: Adrian Prows, MD;  Location: [facility];  Service: Cardiovascular;  Laterality: N/A;  . HAMMER TOE SURGERY Left 2012  . JOINT REPLACEMENT    . LEFT HEART CATH AND CORONARY ANGIOGRAPHY N/A 06/01/2018   Procedure: LEFT HEART CATH AND CORONARY ANGIOGRAPHY;  Surgeon: Adrian Prows, MD;  Location: [facility];  Service: Cardiovascular;  Laterality: N/A;  . LUMBAR [place]     "I had 2 other lower back ORs  before the one in 2015"  . LUMBAR LAMINECTOMY/DECOMPRESSION MICRODISCECTOMY N/A 05/29/2014   Procedure: CENTRAL LUMBAR LAMINECTOMY L3-4 and L4-5;  Surgeon: Jessy Oto, MD;  Location: [place];  Service: Orthopedics;  Laterality: N/A;  . TOTAL KNEE ARTHROPLASTY Left 03/31/2017   Procedure: LEFT TOTAL KNEE ARTHROPLASTY;  Surgeon: Meredith Pel, MD;  Location: [facility];  Service: Orthopedics;  Laterality: Left;  . 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 Smoking status: Never Smoker  . Smokeless tobacco: Never Used  Substance and Sexual  Activity  . Alcohol use: Not Currently    Comment: 06/01/2018  "probably have 3 beers/month"  . Drug use: Never  . Sexual activity: Not Currently

## 2019-07-16 ENCOUNTER — Other Ambulatory Visit: Payer: Self-pay | Admitting: Surgical

## 2019-07-18 NOTE — Telephone Encounter (Signed)
Please advise. Thanks.  

## 2019-07-18 NOTE — Telephone Encounter (Signed)
This is a GD pt 

## 2019-07-19 ENCOUNTER — Ambulatory Visit (INDEPENDENT_AMBULATORY_CARE_PROVIDER_SITE_OTHER): Payer: Medicare Other | Admitting: Cardiology

## 2019-07-19 ENCOUNTER — Encounter: Payer: Self-pay | Admitting: Cardiology

## 2019-07-19 ENCOUNTER — Other Ambulatory Visit: Payer: Self-pay

## 2019-07-19 VITALS — BP 157/69 | HR 80 | Temp 97.9°F | Ht 73.0 in | Wt 236.0 lb

## 2019-07-19 DIAGNOSIS — I10 Essential (primary) hypertension: Secondary | ICD-10-CM | POA: Diagnosis not present

## 2019-07-19 DIAGNOSIS — M7989 Other specified soft tissue disorders: Secondary | ICD-10-CM

## 2019-07-19 DIAGNOSIS — I25118 Atherosclerotic heart disease of native coronary artery with other forms of angina pectoris: Secondary | ICD-10-CM | POA: Diagnosis not present

## 2019-07-19 DIAGNOSIS — R0600 Dyspnea, unspecified: Secondary | ICD-10-CM | POA: Diagnosis not present

## 2019-07-19 DIAGNOSIS — E785 Hyperlipidemia, unspecified: Secondary | ICD-10-CM

## 2019-07-19 DIAGNOSIS — R0609 Other forms of dyspnea: Secondary | ICD-10-CM

## 2019-07-19 DIAGNOSIS — I453 Trifascicular block: Secondary | ICD-10-CM | POA: Diagnosis not present

## 2019-07-19 MED ORDER — ATORVASTATIN CALCIUM 20 MG PO TABS: 20.0000 mg | ORAL_TABLET | Freq: Every day | ORAL | 2 refills | Status: DC

## 2019-07-19 MED ORDER — ISOSORBIDE MONONITRATE ER 30 MG PO TB24: 30.0000 mg | ORAL_TABLET | Freq: Every day | ORAL | 1 refills | Status: DC

## 2019-07-19 MED ORDER — AMLODIPINE BESYLATE 5 MG PO TABS: 5.0000 mg | ORAL_TABLET | Freq: Every day | ORAL | 1 refills | Status: DC

## 2019-07-19 NOTE — Patient Instructions (Addendum)
Please do not take any Sildenafil, Viagra, or Cialis with starting Imdur

## 2019-07-19 NOTE — Progress Notes (Signed)
Primary Physician:  Jani Gravel, MD   Patient ID: Cody Floyd, male    DOB: 09-06-1940, 78 y.o.   MRN: [id number]  Subjective:    Chief Complaint  Patient presents with   Chest Pain   Follow-up    1 year    HPI: Cody Floyd  is a 78 y.o. male  with history of lumbar radiculopathy, GERD, hypertension, migraines, and hyperlipidemia, CAD s/p complex intervention to the proximal LAD implantation of 3 overlapping stents and also obtuse marginal 1 stenosis. Also had drug-eluting stent placed on 06/01/2018.  He was last seen 1 year ago. He has been having chest pain for the last few days. Feels like indigestion and has associated belching. Occurs several times a day. He has used some TUMS with relief. He does notice shortness of breath with doing minimal activities, similar to prior to his cath in 2019.  He is also complaining of right leg swelling.  He reports having knee replacement on his right knee in September of this year and since that time he has noticed that his right leg appears to be bigger.  He denies any tenderness with palpation of his leg, but does have some discomfort with moving certain ways.  And is stated that it is difficult for him to drive.  He is found to not be taking Plavix or Lipitor, states that he ran out of medications. He has gait imbalance, in which he uses a cane. He has a history of erectile dysfunction, eating states that he is not using Viagra or Cialis at this time.  Past Medical History:  Diagnosis Date   Arthritis    "a little all over my body" (06/01/2018)   Chronic lower back pain    GERD (gastroesophageal reflux disease)    History of BPH    Hyperlipidemia    Hypertension    RLS (restless legs syndrome)    Ventral hernia    Vertigo     Past Surgical History:  Procedure Laterality Date   BACK SURGERY     CORONARY ANGIOPLASTY WITH STENT PLACEMENT  06/01/2018   CORONARY STENT INTERVENTION N/A 06/01/2018   Procedure: CORONARY  STENT INTERVENTION;  Surgeon: Adrian Prows, MD;  Location: [facility];  Service: Cardiovascular;  Laterality: N/A;   HAMMER TOE SURGERY Left 2012   JOINT REPLACEMENT     LEFT HEART CATH AND CORONARY ANGIOGRAPHY N/A 06/01/2018   Procedure: LEFT HEART CATH AND CORONARY ANGIOGRAPHY;  Surgeon: Adrian Prows, MD;  Location: [facility];  Service: Cardiovascular;  Laterality: N/A;   LUMBAR DISC SURGERY     "I had 2 other lower back ORs before the one in 2015"   LUMBAR LAMINECTOMY/DECOMPRESSION MICRODISCECTOMY N/A 05/29/2014   Procedure: CENTRAL LUMBAR LAMINECTOMY L3-4 and L4-5;  Surgeon: Jessy Oto, MD;  Location: [facility];  Service: Orthopedics;  Laterality: N/A;   TOTAL KNEE ARTHROPLASTY Left 03/31/2017   Procedure: LEFT TOTAL KNEE ARTHROPLASTY;  Surgeon: Meredith Pel, MD;  Location: [place] [facility];  Service: Orthopedics;  Laterality: Left;   TOTAL KNEE ARTHROPLASTY Right 05/24/2019   Procedure: RIGHT TOTAL KNEE ARTHROPLASTY;  Surgeon: Meredith Pel, MD;  Location: [place];  Service: Orthopedics;  Laterality: Right;    Social History   Socioeconomic History   Marital status: Significant Other    Spouse name: Not on file   Number of children: 0   Years of education: 8   Highest education level: Not on file  Occupational  History    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[employer], Psychologist, sport and exercise strain: Not on file   Food insecurity    Worry: Not on file    Inability: Not on file   Transportation needs    Medical: Not on file    Non-medical: Not on file  Tobacco Use   Smoking status: Never Smoker   Smokeless tobacco: Never Used  Substance and Sexual Activity   Alcohol use: Not Currently    Comment: 06/01/2018  "probably have 3 beers/month"   Drug use: Never   Sexual activity: Not Currently  Lifestyle   Physical activity    Days per week: Not on file    Minutes per session: Not on file   Stress: Not on file  Relationships   Social  connections    Talks on phone: Not on file    Gets together: Not on file    Attends religious service: Not on file    Active member of club or organization: Not on file    Attends meetings of clubs or organizations: Not on file    Relationship status: Not on file   Intimate partner violence    Fear of current or ex partner: Not on file    Emotionally abused: Not on file    Physically abused: Not on file    Forced sexual activity: Not on file  Other Topics Concern   Not on file  Social History Narrative   Lives with sig other   Caffeine use- coffee all day    Review of Systems  Constitution: Negative for decreased appetite, malaise/fatigue, weight gain and weight loss.  Eyes: Negative for visual disturbance.  Cardiovascular: Positive for chest pain, dyspnea on exertion and leg swelling (right leg). Negative for claudication, orthopnea, palpitations and syncope.  Respiratory: Negative for hemoptysis and wheezing.   Endocrine: Negative for cold intolerance and heat intolerance.  Hematologic/Lymphatic: Does not bruise/bleed easily.  Skin: Negative for nail changes.  Musculoskeletal: Negative for muscle weakness and myalgias.  Gastrointestinal: Negative for abdominal pain, change in bowel habit, nausea and vomiting.  Neurological: Positive for loss of balance. Negative for difficulty with concentration, dizziness, focal weakness and headaches.  Psychiatric/Behavioral: Negative for altered mental status and suicidal ideas.  All other systems reviewed and are negative.     Objective:  Blood pressure (!) 157/69, pulse 80, temperature 97.9 F (36.6 C), height _0  (1.854 m), weight 236 lb (107 kg), SpO2 96 %. Body mass index is 31.14 kg/m.    Physical Exam  Constitutional: He is oriented to person, place, and time. Vital signs are normal. He appears well-developed and well-nourished.  Mildly obese  HENT:  Head: Normocephalic and atraumatic.  Neck: Normal range of motion.    Cardiovascular: Normal rate, regular rhythm, normal heart sounds and intact distal pulses.  Pulses:      Femoral pulses are 2+ on the right side with bruit and 2+ on the left side with bruit.      Popliteal pulses are 0 on the right side and 2+ on the left side.       Dorsalis pedis pulses are 0 on the right side and 0 on the left side.       Posterior tibial pulses are 0 on the right side and 0 on the left side.  RLE: generalized 2+ pitting edema. Erythema. No tenderness.   Pulmonary/Chest: Effort normal and breath sounds normal. No accessory muscle usage. No respiratory distress.  Abdominal: Soft. Bowel sounds are normal.  Musculoskeletal: Normal range of motion.  Neurological: He is alert and oriented to person, place, and time.  Skin: Skin is warm and dry.  Vitals reviewed.  Radiology: No results found.  Laboratory examination:   07/06/2018: Cholesterol 120, triglycerides 104, HDL 37, LDL 62.  03/30/2018: eGFR 74, Creatinine 1.1, Potassium 4.9, BMP normal. Platelets 138, CBC otherwise normal. TSH 2.5. HB 14.0/HCT 41.9, platelets 138,000. Vitamin B12 373.  CMP Latest Ref Rng & Units 05/18/2019 07/30/2018 06/02/2018  Glucose 70 - 99 mg/dL 112(H) - 116(H)  BUN 8 - 23 mg/dL 18 - 17  Creatinine 0.61 - 1.24 mg/dL 0.95 - 1.05  Sodium 135 - 145 mmol/L 140 - 139  Potassium 3.5 - 5.1 mmol/L 4.0 - 3.7  Chloride 98 - 111 mmol/L 109 - 107  CO2 22 - 32 mmol/L 21(L) - 23  Calcium 8.9 - 10.3 mg/dL 9.4 - 8.8(L)  Total Protein 6.0 - 8.5 g/dL - 6.6 -  Total Bilirubin 0.3 - 1.2 mg/dL - - -  Alkaline Phos 39 - 117 U/L - - -  AST 0 - 37 U/L - - -  ALT 0 - 53 U/L - - -   CBC Latest Ref Rng & Units 05/18/2019 06/02/2018 03/20/2017  WBC 4.0 - 10.5 K/uL 5.6 7.1 7.0  Hemoglobin 13.0 - 17.0 g/dL 14.6 12.6(L) 14.9  Hematocrit 39.0 - 52.0 % 45.3 39.5 43.8  Platelets 150 - 400 K/uL 152 144(L) 148(L)   Lipid Panel  No results found for: CHOL, TRIG, HDL, CHOLHDL, VLDL, LDLCALC, LDLDIRECT HEMOGLOBIN  A1C Lab Results  Component Value Date   HGBA1C 5.6 07/30/2018   TSH Recent Labs    07/30/18 0927  TSH 2.090    PRN Meds:. Medications Discontinued During This Encounter  Medication Reason   HYDROcodone-acetaminophen (NORCO/VICODIN) 5-325 MG tablet    atorvastatin (LIPITOR) 20 MG tablet Reorder   Current Meds  Medication Sig   aspirin EC 81 MG tablet Take 81 mg by mouth daily.   meclizine (ANTIVERT) 25 MG tablet Take 25 mg by mouth 3 (three) times daily as needed for dizziness.   nitroGLYCERIN (NITROSTAT) 0.4 MG SL tablet Place 0.4 mg under the tongue every 5 (five) minutes as needed for chest pain.   oxyCODONE (OXY IR/ROXICODONE) 5 MG immediate release tablet Take 1 tablet (5 mg total) by mouth daily as needed for moderate pain (pain score 4-6).   pantoprazole (PROTONIX) 40 MG tablet Take 1 tablet (40 mg total) by mouth daily.   pramipexole (MIRAPEX) 0.5 MG tablet TAKE 1 TABLET BY MOUTH ONCE A DAY BEFORE BEDTIME FOR RESTLESS LEG   terazosin (HYTRIN) 5 MG capsule Take 5 mg by mouth at bedtime.     Cardiac Studies:   Coronary angiogram 06/01/2018: LM short normal. LAD prox to mid 80-99% S/P 2.5x40, 2.75x15 and 2.5x13 mm Orserio DES. OM1 80% S/P 2.5x13 mm Orserio DES. All stenosis reduced to 0%. Mid RCA 50%. Normal LV.  Echo- 05/13/2018 1. Left ventricle cavity is normal in size. Moderate concentric hypertrophy of the left ventricle. Borderline normal global wall motion. Visual EF is 50-55%. Doppler evidence of grade I (impaired) diastolic dysfunction. Calculated EF 55%. 2. Mild to moderate aortic regurgitation. Mild aortic valve leaflet calcification. 3. Trace mitral regurgitation. Mild calcification of the mitral valve annulus  Lexiscan myoview stress test 04/02/2018: 1. Lexiscan stress test was performed. Exercise capacity was not assessed. Stress symptoms included nausea, dizziness. Resting blood pressure was 142/68 mmHg, peak  effect blood pressure was 110/50 mmHg.  The resting and stress electrocardiogram demonstrated normal sinus rhythm, RBBB + LAHB, no resting arrhythmias and normal rest repolarization. Stress EKG is non diagnostic for ischemia as it is a pharmacologic stress. 2. The overall quality of the study is good. Left ventricular cavity is noted to be normal on the rest and stress studies. Gated SPECT images reveal normal myocardial thickening and wall motion. The left ventricular ejection fraction was calculated or visually estimated to be 55%. SPECT images demonstrate medium sized perfusion abnormality of mild intensity in the basal inferior, mid inferoseptal, mid inferior, apical septal and apical inferior myocardial wall(s) on the stress images, with moderate reversibility on rest images. Findings suggest medium sized RCA territory infarct with moderate superimposed ischemia. 3. Intermediate risk study.  Assessment:   Atherosclerosis of native coronary artery of native heart with stable angina pectoris ([place]) - Plan: EKG 12-Lead  Dyspnea on exertion  Primary hypertension  Trifascicular block  Mild hyperlipidemia  Right leg swelling - Plan: VAS [place] LOWER EXTREMITY VENOUS (DVT)  EKG 07/19/2019: Normal sinus rhythm at 76 bpm with first degree AV block, left axis deviation, left anterior fascicular block. Right bundle branch block. Trifasicular block. No changes from EKG 06/09/2018    Recommendations:   Patient was last seen in November 2019, was lost to follow-up.  He presented to our office today complaining of chest pain for the last few days.  He has had intermittent episodes of chest discomfort that he describes as indigestion feeling associated with belching.  Questionable if relieved by Tums or if resolves on its own.  He has had PCI to LAD and OM1 in October 2019.  His symptoms are concerning for potential angina.  He has not been taking his Plavix or Lipitor for the last several months as he states he ran out of refills.  I have  refilled his Lipitor.  Continue with aspirin therapy for now.  I will start him on isosorbide mononitrate 30 mg daily for chest discomfort.  His blood pressure is also elevated, will also start amlodipine 5 mg daily.  He was advised to not use erectile dysfunction medications with Imdur in view of potential hypotension.   He is also complaining of right leg swelling that occurred sometime after right knee replacement.  He has notable 2+ pitting edema to his right leg, his leg appears larger than his left along with some erythema.  No tenderness.  He does have discomfort with certain positions.  He does not have palpable DP pulse in the right leg, but also has history of PAD.  Given his history of previous surgery, feel that DVT should be excluded.  If he is found to have DVT, will certainly need evaluation for PE given his chest discomfort and shortness of breath.  Will notify him of the results today and plan accordingly.  Depending upon if PE evaluation is needed, will need evaluation of his angina with stress testing and echocardiogram.  I will plan on scheduling this over the phone after we have his venous duplex results.  Advised him to use nitroglycerin as needed.  I will see him back in 2 weeks for close monitoring, encouraged him to contact me sooner if needed.   Miquel Dunn, MSN, APRN, FNP-C [facility]. [place] Office: [phone] Fax: [phone]

## 2019-07-20 ENCOUNTER — Other Ambulatory Visit: Payer: Self-pay

## 2019-07-20 ENCOUNTER — Ambulatory Visit (INDEPENDENT_AMBULATORY_CARE_PROVIDER_SITE_OTHER): Payer: Medicare Other

## 2019-07-20 DIAGNOSIS — M7989 Other specified soft tissue disorders: Secondary | ICD-10-CM | POA: Diagnosis not present

## 2019-07-20 MED ORDER — ISOSORBIDE MONONITRATE ER 30 MG PO TB24: 30.0000 mg | ORAL_TABLET | Freq: Every day | ORAL | 0 refills | Status: DC

## 2019-07-25 ENCOUNTER — Telehealth: Payer: Self-pay | Admitting: Cardiology

## 2019-07-25 DIAGNOSIS — I209 Angina pectoris, unspecified: Secondary | ICD-10-CM

## 2019-07-25 DIAGNOSIS — R0609 Other forms of dyspnea: Secondary | ICD-10-CM

## 2019-07-25 NOTE — Telephone Encounter (Signed)
I discussed recently obtained right lower extremity venous duplex that was without evidence of DVT.  He reports having improvement in his chest pain since being on medical therapy and is tolerating medications well.  His chest pain has now completely resolved.  I would recommend obtaining echocardiogram given his leg swelling and recent complaints of shortness of breath as well as chest pain.  We will also schedule for Lexiscan nuclear stress testing given his recent concerning symptoms of angina.  We will continue with present medications for now.  I will see him back after the test for follow-up.

## 2019-07-27 NOTE — Telephone Encounter (Signed)
Please handle thanks

## 2019-07-28 ENCOUNTER — Telehealth: Payer: Self-pay | Admitting: Cardiology

## 2019-08-01 ENCOUNTER — Other Ambulatory Visit: Payer: Self-pay

## 2019-08-01 ENCOUNTER — Ambulatory Visit (INDEPENDENT_AMBULATORY_CARE_PROVIDER_SITE_OTHER): Payer: Medicare Other

## 2019-08-01 DIAGNOSIS — R0609 Other forms of dyspnea: Secondary | ICD-10-CM

## 2019-08-02 ENCOUNTER — Encounter: Payer: Self-pay | Admitting: Cardiology

## 2019-08-02 ENCOUNTER — Other Ambulatory Visit: Payer: Self-pay

## 2019-08-02 ENCOUNTER — Ambulatory Visit (INDEPENDENT_AMBULATORY_CARE_PROVIDER_SITE_OTHER): Payer: Medicare Other | Admitting: Cardiology

## 2019-08-02 VITALS — BP 168/77 | HR 71 | Temp 97.5°F | Resp 14 | Ht 73.0 in | Wt 244.0 lb

## 2019-08-02 DIAGNOSIS — I25118 Atherosclerotic heart disease of native coronary artery with other forms of angina pectoris: Secondary | ICD-10-CM

## 2019-08-02 DIAGNOSIS — R0609 Other forms of dyspnea: Secondary | ICD-10-CM

## 2019-08-02 DIAGNOSIS — M7989 Other specified soft tissue disorders: Secondary | ICD-10-CM

## 2019-08-02 DIAGNOSIS — I10 Essential (primary) hypertension: Secondary | ICD-10-CM | POA: Diagnosis not present

## 2019-08-02 MED ORDER — VALSARTAN-HYDROCHLOROTHIAZIDE 160-12.5 MG PO TABS: 1.0000 | ORAL_TABLET | Freq: Every day | ORAL | 1 refills | Status: DC

## 2019-08-02 NOTE — Progress Notes (Signed)
Primary Physician:  Jani Gravel, MD   Patient ID: Cody Floyd, male    DOB: [birth year], 78 y.o.   MRN: [id number]  Subjective:    Chief Complaint  Patient presents with  . Chest Pain  . Shortness of Breath  . Follow-up    HPI: Cody Floyd  is a 78 y.o. male  with history of lumbar radiculopathy, GERD, hypertension, migraines, and hyperlipidemia, CAD s/p complex intervention to the proximal LAD implantation of 3 overlapping stents and also obtuse marginal 1 stenosis. Also had drug-eluting stent placed on 06/01/2018.  He was last seen 2 weeks ago for acute visit for symptoms concerning for angina.  Also had right leg swelling.  He underwent venous duplex to rule out DVT, which was negative.  He has now been scheduled for Lexiscan nuclear stress testing and echocardiogram.  He was started on isosorbide and amlodipine and now presents for follow-up.  He has had improvement in his symptoms since last seen by me.  He has not had to use any nitroglycerin.  Chest pain has essentially resolved.  Dyspnea on exertion is still present, but is better.  He is also noted some improvement in right leg swelling.  He has been using compression stockings that have been helping with this.  No PND or orthopnea.  No shortness of breath at rest or hemoptysis.  He has gait imbalance, in which he uses a cane. He has a history of erectile dysfunction, eating states that he is not using Viagra or Cialis at this time.  Past Medical History:  Diagnosis Date  . Arthritis    "a little all over my body" (06/01/2018)  . Chronic lower back pain   . GERD (gastroesophageal reflux disease)   . History of BPH   . Hyperlipidemia   . Hypertension   . RLS (restless legs syndrome)   . Ventral hernia   . Vertigo     Past Surgical History:  Procedure Laterality Date  . BACK SURGERY    . CORONARY ANGIOPLASTY WITH STENT PLACEMENT  06/01/2018  . CORONARY STENT INTERVENTION N/A 06/01/2018   Procedure: CORONARY STENT  INTERVENTION;  Surgeon: Adrian Prows, MD;  Location: [facility];  Service: Cardiovascular;  Laterality: N/A;  . HAMMER TOE SURGERY Left 2012  . JOINT REPLACEMENT    . LEFT HEART CATH AND CORONARY ANGIOGRAPHY N/A 06/01/2018   Procedure: LEFT HEART CATH AND CORONARY ANGIOGRAPHY;  Surgeon: Adrian Prows, MD;  Location: [facility];  Service: Cardiovascular;  Laterality: N/A;  . LUMBAR [place]     "I had 2 other lower back ORs before the one in 2015"  . LUMBAR LAMINECTOMY/DECOMPRESSION MICRODISCECTOMY N/A 05/29/2014   Procedure: CENTRAL LUMBAR LAMINECTOMY L3-4 and L4-5;  Surgeon: Jessy Oto, MD;  Location: [place] [facility];  Service: Orthopedics;  Laterality: N/A;  . TOTAL KNEE ARTHROPLASTY Left 03/31/2017   Procedure: LEFT TOTAL KNEE ARTHROPLASTY;  Surgeon: Meredith Pel, MD;  Location: [facility];  Service: Orthopedics;  Laterality: Left;  . TOTAL KNEE ARTHROPLASTY Right 05/24/2019   Procedure: RIGHT TOTAL KNEE ARTHROPLASTY;  Surgeon: Meredith Pel, MD;  Location: [facility];  Service: Orthopedics;  Laterality: Right;    Social History   Socioeconomic History  . Marital status: Significant Other    Spouse name: Not on file  . Number of children: 0  . Years of education: 8  . 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 Financial resource strain: Not on file  . Food insecurity    Worry: Not on file    Inability: Not on file  . Transportation needs    Medical: Not on file    Non-medical: Not on file  Tobacco Use  . Smoking status: Never Smoker  . Smokeless tobacco: Never Used  Substance and Sexual Activity  . Alcohol use: Not Currently    Comment: 06/01/2018  "probably have 3 beers/month"  . Drug use: Never  . Sexual activity: Not Currently  Lifestyle  . Physical activity    Days per week: Not on file    Minutes per session: Not on file  . Stress: Not on file  Relationships  . Social  Herbalist on phone: Not on file    Gets together: Not on file    Attends religious service: Not on file    Active member of club or organization: Not on file    Attends meetings of clubs or organizations: Not on file    Relationship status: Not on file  . Intimate partner violence    Fear of current or ex partner: Not on file    Emotionally abused: Not on file    Physically abused: Not on file    Forced sexual activity: Not on file  Other Topics Concern  . Not on file  Social History Narrative   Lives with sig other   Caffeine use- coffee all day    Review of Systems  Constitution: Negative for decreased appetite, malaise/fatigue, weight gain and weight loss.  Eyes: Negative for visual disturbance.  Cardiovascular: Positive for dyspnea on exertion and leg swelling (right leg-improved). Negative for chest pain, claudication, orthopnea, palpitations and syncope.  Respiratory: Negative for hemoptysis and wheezing.   Endocrine: Negative for cold intolerance and heat intolerance.  Hematologic/Lymphatic: Does not bruise/bleed easily.  Skin: Negative for nail changes.  Musculoskeletal: Negative for muscle weakness and myalgias.  Gastrointestinal: Negative for abdominal pain, change in bowel habit, nausea and vomiting.  Neurological: Positive for loss of balance. Negative for difficulty with concentration, dizziness, focal weakness and headaches.  Psychiatric/Behavioral: Negative for altered mental status and suicidal ideas.  All other systems reviewed and are negative.     Objective:  Blood pressure (!) 168/77, pulse 71, temperature (!) 97.5 F (36.4 C), resp. rate 14, height _0  (1.854 m), weight 244 lb (110.7 kg), SpO2 97 %. Body mass index is 32.19 kg/m.    Physical Exam  Constitutional: He is oriented to person, place, and time. Vital signs are normal. He appears well-developed and well-nourished.  Mildly obese  HENT:  Head: Normocephalic and atraumatic.  Neck:  Normal range of motion.  Cardiovascular: Normal rate, regular rhythm, normal heart sounds and intact distal pulses.  Pulses:      Femoral pulses are 2+ on the right side with bruit and 2+ on the left side with bruit.      Popliteal pulses are 0 on the right side and 2+ on the left side.       Dorsalis pedis pulses are 0 on the right side and 0 on the left side.       Posterior tibial pulses are 0 on the right side and 0 on the left side.  RLE: generalized 2+ pitting edema. Erythema. No tenderness.   Pulmonary/Chest: Effort normal and breath sounds normal. No accessory muscle usage. No respiratory distress.  Abdominal: Soft. Bowel sounds  are normal.  Musculoskeletal: Normal range of motion.  Neurological: He is alert and oriented to person, place, and time.  Skin: Skin is warm and dry.  Vitals reviewed.  Radiology: No results found.  Laboratory examination:   07/06/2018: Cholesterol 120, triglycerides 104, HDL 37, LDL 62.  03/30/2018: eGFR 74, Creatinine 1.1, Potassium 4.9, BMP normal. Platelets 138, CBC otherwise normal. TSH 2.5. HB 14.0/HCT 41.9, platelets 138,000. Vitamin B12 373.  CMP Latest Ref Rng & Units 05/18/2019 07/30/2018 06/02/2018  Glucose 70 - 99 mg/dL 112(H) - 116(H)  BUN 8 - 23 mg/dL 18 - 17  Creatinine 0.61 - 1.24 mg/dL 0.95 - 1.05  Sodium 135 - 145 mmol/L 140 - 139  Potassium 3.5 - 5.1 mmol/L 4.0 - 3.7  Chloride 98 - 111 mmol/L 109 - 107  CO2 22 - 32 mmol/L 21(L) - 23  Calcium 8.9 - 10.3 mg/dL 9.4 - 8.8(L)  Total Protein 6.0 - 8.5 g/dL - 6.6 -  Total Bilirubin 0.3 - 1.2 mg/dL - - -  Alkaline Phos 39 - 117 U/L - - -  AST 0 - 37 U/L - - -  ALT 0 - 53 U/L - - -   CBC Latest Ref Rng & Units 05/18/2019 06/02/2018 03/20/2017  WBC 4.0 - 10.5 K/uL 5.6 7.1 7.0  Hemoglobin 13.0 - 17.0 g/dL 14.6 12.6(L) 14.9  Hematocrit 39.0 - 52.0 % 45.3 39.5 43.8  Platelets 150 - 400 K/uL 152 144(L) 148(L)   Lipid Panel  No results found for: CHOL, TRIG, HDL, CHOLHDL, VLDL, LDLCALC,  LDLDIRECT HEMOGLOBIN A1C Lab Results  Component Value Date   HGBA1C 5.6 07/30/2018   TSH No results for input(s): TSH in the last 8760 hours.  PRN Meds:. There are no discontinued medications. Current Meds  Medication Sig  . amLODipine (NORVASC) 5 MG tablet Take 1 tablet (5 mg total) by mouth daily.  [name] aspirin EC 81 MG tablet Take 81 mg by mouth daily.  [name] atorvastatin (LIPITOR) 20 MG tablet Take 1 tablet (20 mg total) by mouth daily.  . clopidogrel (PLAVIX) 75 MG tablet Take 1 tablet (75 mg total) by mouth daily with breakfast.  . meclizine (ANTIVERT) 25 MG tablet Take 25 mg by mouth 3 (three) times daily as needed for dizziness.  . nitroGLYCERIN (NITROSTAT) 0.4 MG SL tablet Place 0.4 mg under the tongue every 5 (five) minutes as needed for chest pain.  [name] oxyCODONE (OXY IR/ROXICODONE) 5 MG immediate release tablet Take 1 tablet (5 mg total) by mouth daily as needed for moderate pain (pain score 4-6).  . pantoprazole (PROTONIX) 40 MG tablet Take 1 tablet (40 mg total) by mouth daily.  . pramipexole (MIRAPEX) 0.5 MG tablet TAKE 1 TABLET BY MOUTH ONCE A DAY BEFORE BEDTIME FOR RESTLESS LEG  . tiZANidine (ZANAFLEX) 2 MG tablet Take 1 tablet (2 mg total) by mouth every 8 (eight) hours as needed.    Cardiac Studies:   Coronary angiogram 06/01/2018: LM short normal. LAD prox to mid 80-99% S/P 2.5x40, 2.75x15 and 2.5x13 mm Orserio DES. OM1 80% S/P 2.5x13 mm Orserio DES. All stenosis reduced to 0%. Mid RCA 50%. Normal LV.  Echo- 05/13/2018 1. Left ventricle cavity is normal in size. Moderate concentric hypertrophy of the left ventricle. Borderline normal global wall motion. Visual EF is 50-55%. Doppler evidence of grade I (impaired) diastolic dysfunction. Calculated EF 55%. 2. Mild to moderate aortic regurgitation. Mild aortic valve leaflet calcification. 3. Trace mitral regurgitation. Mild calcification of the mitral valve annulus  Lexiscan myoview stress test 04/02/2018: 1. Lexiscan stress  test was performed. Exercise capacity was not assessed. Stress symptoms included nausea, dizziness. Resting blood pressure was 142/68 mmHg, peak effect blood pressure was 110/50 mmHg. The resting and stress electrocardiogram demonstrated normal sinus rhythm, RBBB + LAHB, no resting arrhythmias and normal rest repolarization. Stress EKG is non diagnostic for ischemia as it is a pharmacologic stress. 2. The overall quality of the study is good. Left ventricular cavity is noted to be normal on the rest and stress studies. Gated SPECT images reveal normal myocardial thickening and wall motion. The left ventricular ejection fraction was calculated or visually estimated to be 55%. SPECT images demonstrate medium sized perfusion abnormality of mild intensity in the basal inferior, mid inferoseptal, mid inferior, apical septal and apical inferior myocardial wall(s) on the stress images, with moderate reversibility on rest images. Findings suggest medium sized RCA territory infarct with moderate superimposed ischemia. 3. Intermediate risk study.  Assessment:   Atherosclerosis of native coronary artery of native heart with stable angina pectoris ([place]) - Plan: Basic metabolic panel  Dyspnea on exertion  Primary hypertension - Plan: Basic metabolic panel  Right leg swelling  EKG 07/19/2019: Normal sinus rhythm at 76 bpm with first degree AV block, left axis deviation, left anterior fascicular block. Right bundle branch block. Trifasicular block. No changes from EKG 06/09/2018    Recommendations:   Patient has had improvement in symptoms of angina since last seen by me with medical therapy, will continue same.  He is scheduled for stress testing next week, would continue with evaluation given his recent symptoms.  He also continues to have dyspnea on exertion, although has noted some improvement.  His blood pressure continues to be markedly elevated.  I will start him on valsartan 160 mg daily along with  hydrochlorothiazide 12.5 mg for his leg swelling.  Will check BMP in 2 weeks for surveillance.  Could also consider addition of beta-blocker therapy in view of CAD history.  He has had improvement in right leg swelling, encouraged him to continue with leg elevation and using support stockings.  He does not appear to be scheduled for echocardiogram, which will be arranged today given his continued dyspnea.  Otherwise he is on appropriate medical therapy.  I will see him back as previously scheduled in January for follow-up and to discuss test results.   Miquel Dunn, MSN, APRN, FNP-C [facility]. [place] Office: [phone] Fax: [phone]

## 2019-08-02 NOTE — Addendum Note (Signed)
Addended by: Gwinda Maine on: 08/02/2019 10:10 AM   Modules accepted: Orders

## 2019-08-08 ENCOUNTER — Ambulatory Visit (INDEPENDENT_AMBULATORY_CARE_PROVIDER_SITE_OTHER): Payer: Medicare Other

## 2019-08-08 ENCOUNTER — Other Ambulatory Visit: Payer: Self-pay

## 2019-08-08 DIAGNOSIS — I209 Angina pectoris, unspecified: Secondary | ICD-10-CM

## 2019-08-17 ENCOUNTER — Ambulatory Visit: Payer: Medicare Other | Admitting: Orthopedic Surgery

## 2019-08-22 ENCOUNTER — Other Ambulatory Visit: Payer: Self-pay

## 2019-08-22 ENCOUNTER — Ambulatory Visit (INDEPENDENT_AMBULATORY_CARE_PROVIDER_SITE_OTHER): Payer: Self-pay

## 2019-08-22 DIAGNOSIS — R0609 Other forms of dyspnea: Secondary | ICD-10-CM

## 2019-08-29 ENCOUNTER — Other Ambulatory Visit: Payer: Self-pay

## 2019-08-29 ENCOUNTER — Ambulatory Visit (INDEPENDENT_AMBULATORY_CARE_PROVIDER_SITE_OTHER): Payer: Medicare Other | Admitting: Cardiology

## 2019-08-29 ENCOUNTER — Encounter: Payer: Self-pay | Admitting: Cardiology

## 2019-08-29 VITALS — BP 145/73 | HR 68 | Ht 73.0 in | Wt 244.0 lb

## 2019-08-29 DIAGNOSIS — I10 Essential (primary) hypertension: Secondary | ICD-10-CM

## 2019-08-29 DIAGNOSIS — I25118 Atherosclerotic heart disease of native coronary artery with other forms of angina pectoris: Secondary | ICD-10-CM | POA: Diagnosis not present

## 2019-08-29 DIAGNOSIS — M7989 Other specified soft tissue disorders: Secondary | ICD-10-CM | POA: Diagnosis not present

## 2019-08-29 MED ORDER — METOPROLOL SUCCINATE ER 25 MG PO TB24: 25.0000 mg | ORAL_TABLET | Freq: Every day | ORAL | 2 refills | Status: DC

## 2019-08-29 NOTE — Progress Notes (Signed)
Primary Physician:  Jani Gravel, MD   Patient ID: Cody Floyd, male    DOB: 12-04-1940, 80 y.o.   MRN: [id number]  Subjective:    Chief Complaint  Patient presents with  . Chest Pain  . Shortness of Breath    HPI: Cody Floyd  is a 79 y.o. male  with history of lumbar radiculopathy, GERD, hypertension, migraines, and hyperlipidemia, CAD s/p complex intervention to the proximal LAD implantation of 3 overlapping stents and also obtuse marginal 1 stenosis. Also had drug-eluting stent placed on 06/01/2018.  He was last seen 4 weeks ago, since being on amlodipine and Imdur, his symptoms have improved and he reports almost essentially resolved. He has had 1 or 2 episodes of very mild discomfort, not requiring nitroglycerin. He has recently underwent echocardiogram and lexiscan nuclear stress test and now presents for follow up.   He is also noted some improvement in right leg swelling.  He has been using compression stockings that have been helping with this.  No PND or orthopnea.  No shortness of breath at rest or hemoptysis.  He has gait imbalance, in which he uses a cane. He has a history of erectile dysfunction, eating states that he is not using Viagra or Cialis at this time.  Past Medical History:  Diagnosis Date  . Arthritis    "a little all over my body" (06/01/2018)  . Chronic lower back pain   . GERD (gastroesophageal reflux disease)   . History of BPH   . Hyperlipidemia   . Hypertension   . RLS (restless legs syndrome)   . Ventral hernia   . Vertigo     Past Surgical History:  Procedure Laterality Date  . BACK SURGERY    . CORONARY ANGIOPLASTY WITH STENT PLACEMENT  06/01/2018  . CORONARY STENT INTERVENTION N/A 06/01/2018   Procedure: CORONARY STENT INTERVENTION;  Surgeon: Adrian Prows, MD;  Location: [place] [facility];  Service: Cardiovascular;  Laterality: N/A;  . HAMMER TOE SURGERY Left 2012  . JOINT REPLACEMENT    . LEFT HEART CATH AND CORONARY ANGIOGRAPHY N/A  06/01/2018   Procedure: LEFT HEART CATH AND CORONARY ANGIOGRAPHY;  Surgeon: Adrian Prows, MD;  Location: [place] [facility];  Service: Cardiovascular;  Laterality: N/A;  . LUMBAR [place]     "I had 2 other lower back ORs before the one in 2015"  . LUMBAR LAMINECTOMY/DECOMPRESSION MICRODISCECTOMY N/A 05/29/2014   Procedure: CENTRAL LUMBAR LAMINECTOMY L3-4 and L4-5;  Surgeon: Jessy Oto, MD;  Location: [facility];  Service: Orthopedics;  Laterality: N/A;  . TOTAL KNEE ARTHROPLASTY Left 03/31/2017   Procedure: LEFT TOTAL KNEE ARTHROPLASTY;  Surgeon: Meredith Pel, MD;  Location: [place];  Service: Orthopedics;  Laterality: Left;  . TOTAL KNEE ARTHROPLASTY Right 05/24/2019   Procedure: RIGHT TOTAL KNEE ARTHROPLASTY;  Surgeon: Meredith Pel, MD;  Location: [place];  Service: Orthopedics;  Laterality: Right;    Social History   Socioeconomic History  . Marital status: Significant Other    Spouse name: Not on file  . Number of children: 0  . Years of education: 8  . 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 Smoking status: Never Smoker  . Smokeless tobacco: Never Used  Substance and Sexual Activity  . Alcohol use: Not Currently    Comment: 06/01/2018  "probably have 3 beers/month"  . Drug use: Never  .  Sexual activity: Not Currently  Other Topics Concern  . Not on file  Social History Narrative   Lives with sig other   Caffeine use- coffee all day   Social Determinants of Health   Financial Resource Strain:   . Difficulty of Paying Living Expenses: Not on file  Food Insecurity:   . Worried About Charity fundraiser in the Last Year: Not on file  . Ran Out of Food in the Last Year: Not on file  Transportation Needs:   . Lack of Transportation (Medical): Not on file  . Lack of Transportation (Non-Medical): Not on file  Physical Activity:   . Days of Exercise per Week: Not on file  . Minutes of  Exercise per Session: Not on file  Stress:   . Feeling of Stress : Not on file  Social Connections:   . Frequency of Communication with Friends and Family: Not on file  . Frequency of Social Gatherings with Friends and Family: Not on file  . Attends Religious Services: Not on file  . Active Member of Clubs or Organizations: Not on file  . Attends Archivist Meetings: Not on file  . Marital Status: Not on file  Intimate Partner Violence:   . Fear of Current or Ex-Partner: Not on file  . Emotionally Abused: Not on file  . Physically Abused: Not on file  . Sexually Abused: Not on file    Review of Systems  Constitution: Negative for decreased appetite, malaise/fatigue, weight gain and weight loss.  Eyes: Negative for visual disturbance.  Cardiovascular: Positive for dyspnea on exertion and leg swelling (right leg-improved). Negative for chest pain, claudication, orthopnea, palpitations and syncope.  Respiratory: Negative for hemoptysis and wheezing.   Endocrine: Negative for cold intolerance and heat intolerance.  Hematologic/Lymphatic: Does not bruise/bleed easily.  Skin: Negative for nail changes.  Musculoskeletal: Negative for muscle weakness and myalgias.  Gastrointestinal: Negative for abdominal pain, change in bowel habit, nausea and vomiting.  Neurological: Positive for loss of balance. Negative for difficulty with concentration, dizziness, focal weakness and headaches.  Psychiatric/Behavioral: Negative for altered mental status and suicidal ideas.  All other systems reviewed and are negative.     Objective:  Blood pressure (!) 145/73, pulse 68, height '6\' 1"'$  (1.854 m), weight 244 lb (110.7 kg), SpO2 97 %. Body mass index is 32.19 kg/m.    Physical Exam  Constitutional: He is oriented to person, place, and time. Vital signs are normal. He appears well-developed and well-nourished.  Mildly obese  HENT:  Head: Normocephalic and atraumatic.  Cardiovascular: Normal  rate, regular rhythm, normal heart sounds and intact distal pulses.  Pulses:      Femoral pulses are 2+ on the right side with bruit and 2+ on the left side with bruit.      Popliteal pulses are 0 on the right side and 2+ on the left side.       Dorsalis pedis pulses are 0 on the right side and 0 on the left side.       Posterior tibial pulses are 0 on the right side and 0 on the left side.  RLE: generalized 2+ pitting edema. Erythema. No tenderness.   Pulmonary/Chest: Effort normal and breath sounds normal. No accessory muscle usage. No respiratory distress.  Abdominal: Soft. Bowel sounds are normal.  Musculoskeletal:        General: Normal range of motion.     Cervical back: Normal range of motion.  Neurological: He is alert  and oriented to person, place, and time.  Skin: Skin is warm and dry.  Vitals reviewed.  Radiology: No results found.  Laboratory examination:   07/06/2018: Cholesterol 120, triglycerides 104, HDL 37, LDL 62.  03/30/2018: eGFR 74, Creatinine 1.1, Potassium 4.9, BMP normal. Platelets 138, CBC otherwise normal. TSH 2.5. HB 14.0/HCT 41.9, platelets 138,000. Vitamin B12 373.  CMP Latest Ref Rng & Units 05/18/2019 07/30/2018 06/02/2018  Glucose 70 - 99 mg/dL 112(H) - 116(H)  BUN 8 - 23 mg/dL 18 - 17  Creatinine 0.61 - 1.24 mg/dL 0.95 - 1.05  Sodium 135 - 145 mmol/L 140 - 139  Potassium 3.5 - 5.1 mmol/L 4.0 - 3.7  Chloride 98 - 111 mmol/L 109 - 107  CO2 22 - 32 mmol/L 21(L) - 23  Calcium 8.9 - 10.3 mg/dL 9.4 - 8.8(L)  Total Protein 6.0 - 8.5 g/dL - 6.6 -  Total Bilirubin 0.3 - 1.2 mg/dL - - -  Alkaline Phos 39 - 117 U/L - - -  AST 0 - 37 U/L - - -  ALT 0 - 53 U/L - - -   CBC Latest Ref Rng & Units 05/18/2019 06/02/2018 03/20/2017  WBC 4.0 - 10.5 K/uL 5.6 7.1 7.0  Hemoglobin 13.0 - 17.0 g/dL 14.6 12.6(L) 14.9  Hematocrit 39.0 - 52.0 % 45.3 39.5 43.8  Platelets 150 - 400 K/uL 152 144(L) 148(L)   Lipid Panel  No results found for: CHOL, TRIG, HDL, CHOLHDL, VLDL,  LDLCALC, LDLDIRECT HEMOGLOBIN A1C Lab Results  Component Value Date   HGBA1C 5.6 07/30/2018   TSH No results for input(s): TSH in the last 8760 hours.  PRN Meds:. There are no discontinued medications. Current Meds  Medication Sig  . amLODipine (NORVASC) 5 MG tablet Take 1 tablet (5 mg total) by mouth daily.  [name] aspirin EC 81 MG tablet Take 81 mg by mouth daily.  [name] atorvastatin (LIPITOR) 20 MG tablet Take 1 tablet (20 mg total) by mouth daily.  . clopidogrel (PLAVIX) 75 MG tablet Take 1 tablet (75 mg total) by mouth daily with breakfast.  . isosorbide mononitrate (IMDUR) 30 MG 24 hr tablet Take 1 tablet (30 mg total) by mouth daily.  . meclizine (ANTIVERT) 25 MG tablet Take 25 mg by mouth 3 (three) times daily as needed for dizziness.  . nitroGLYCERIN (NITROSTAT) 0.4 MG SL tablet Place 0.4 mg under the tongue every 5 (five) minutes as needed for chest pain.  [name] oxyCODONE (OXY IR/ROXICODONE) 5 MG immediate release tablet Take 1 tablet (5 mg total) by mouth daily as needed for moderate pain (pain score 4-6).  . pantoprazole (PROTONIX) 40 MG tablet Take 1 tablet (40 mg total) by mouth daily.  . pramipexole (MIRAPEX) 0.5 MG tablet TAKE 1 TABLET BY MOUTH ONCE A DAY BEFORE BEDTIME FOR RESTLESS LEG  . terazosin (HYTRIN) 5 MG capsule Take 5 mg by mouth at bedtime.   [name] tiZANidine (ZANAFLEX) 2 MG tablet Take 1 tablet (2 mg total) by mouth every 8 (eight) hours as needed.  . valsartan-hydrochlorothiazide (DIOVAN HCT) 160-12.5 MG tablet Take 1 tablet by mouth daily.    Cardiac Studies:   Echocardiogram 08/22/2019: Left ventricle cavity is normal in size. Moderate concentric hypertrophy of the left ventricle. Normal global wall motion. Normal LV systolic function with EF 55%. Doppler evidence of grade I (impaired) diastolic dysfunction, normal LAP.  Trileaflet aortic valve with mild aortic valve leaflet calcification. Moderate (Grade II) aortic regurgitation. Moderate (Grade II) mitral  regurgitation. Inadequate TR jet  to estimate pulmonary artery systolic pressure. Normal right atrial pressure.  No significant change compared to previous study on 08/01/2019.  Lexiscan Tetrofosmin Stress Test  08/08/2019: Nondiagnostic ECG stress. Resting SR, LAD, LAFB, RBBB, trifascicular block. CRO inferior infarct, old.  Perfusion images reveal a moderate to large sized inferior, inferoapical and apical septal scar with mild peri-infarct ischemia in the distribution of right coronary artery or a dominant circumflex coronary artery. Overall function is abnormal with regional wall motion abnormalities. Stress LV EF: 49%. Severely elevated LV end diastolic volume of 817 mL in both rest and stress images.  Intermediate risk study.  Compared to 04/02/2018, no significant change in ischemic findings however, LV dilatation and mild decrease in LV function is new.  Coronary angiogram 06/01/2018: LM short normal. LAD prox to mid 80-99% S/P 2.5x40, 2.75x15 and 2.5x13 mm Orserio DES. OM1 80% S/P 2.5x13 mm Orserio DES. All stenosis reduced to 0%. Mid RCA 50%. Normal LV.  Echo- 05/13/2018 1. Left ventricle cavity is normal in size. Moderate concentric hypertrophy of the left ventricle. Borderline normal global wall motion. Visual EF is 50-55%. Doppler evidence of grade I (impaired) diastolic dysfunction. Calculated EF 55%. 2. Mild to moderate aortic regurgitation. Mild aortic valve leaflet calcification. 3. Trace mitral regurgitation. Mild calcification of the mitral valve annulus   Assessment:   Atherosclerosis of native coronary artery of native heart with stable angina pectoris ([place]) - Plan: Basic metabolic panel, Lipid Profile  Primary hypertension  Right leg swelling  EKG 07/19/2019: Normal sinus rhythm at 76 bpm with first degree AV block, left axis deviation, left anterior fascicular block. Right bundle branch block. Trifasicular block. No changes from EKG 06/09/2018    Recommendations:    I discussed recently obtained nuclear stress test and echocardiogram results, no changes compared to previous studies. He has continued to have improvement in symptoms of angina. He is on appropriate medical therapy, except for beta blocker therapy. Blood pressure continues to be elevated. I will add Metoprolol succinate 25 mg daily. He does have moderate aortic regurgitation, which we will need to continue to monitor. Hopefully this will also help with his blood pressure. Leg swelling has improved with support stockings and leg elevation.  I will plan to check lipids and BMP for continued surveillance. Continue with DAPT for now. I will plan to see him back in  6 weeks for follow up on HTN and angina.   Cody Dunn, MSN, APRN, FNP-C [facility]. [place] Office: [phone] Fax: [phone]

## 2019-09-14 ENCOUNTER — Other Ambulatory Visit: Payer: Self-pay | Admitting: Cardiology

## 2019-10-18 ENCOUNTER — Other Ambulatory Visit: Payer: Self-pay | Admitting: Surgical

## 2019-10-19 ENCOUNTER — Ambulatory Visit: Payer: Medicare Other | Admitting: Cardiology

## 2019-10-19 ENCOUNTER — Other Ambulatory Visit: Payer: Self-pay

## 2019-10-19 ENCOUNTER — Encounter: Payer: Self-pay | Admitting: Cardiology

## 2019-10-19 VITALS — BP 126/68 | HR 73 | Temp 98.3°F | Ht 73.0 in | Wt 245.0 lb

## 2019-10-19 DIAGNOSIS — I453 Trifascicular block: Secondary | ICD-10-CM | POA: Diagnosis not present

## 2019-10-19 DIAGNOSIS — E785 Hyperlipidemia, unspecified: Secondary | ICD-10-CM

## 2019-10-19 DIAGNOSIS — I10 Essential (primary) hypertension: Secondary | ICD-10-CM | POA: Diagnosis not present

## 2019-10-19 DIAGNOSIS — I25118 Atherosclerotic heart disease of native coronary artery with other forms of angina pectoris: Secondary | ICD-10-CM | POA: Diagnosis not present

## 2019-10-19 NOTE — Progress Notes (Signed)
Primary Physician:  Jani Gravel, MD   Patient ID: Cody Floyd, male    DOB: 1941/06/04, 79 y.o.   MRN: [id number]  Subjective:    Chief Complaint  Patient presents with  . Follow-up    6 week  . Coronary Artery Disease  . Hypertension    Cody Floyd  is a 79 y.o. male  With lumbar radiculopathy with gait imbalance, in which he uses a cane, GERD, hypertension, migraines, and hyperlipidemia, CAD s/p complex intervention to the proximal LAD implantation of 3 overlapping stents and also obtuse marginal 1 stenosis S/P drug-eluting stent placed on 06/01/2018. He has been using compression stockings that have been helping with this.  No PND or orthopnea.  No shortness of breath at rest or hemoptysis.  He presents for follow-up of coronary artery disease, hypertension and hyperlipidemia.  Labs were ordered on his prior office visit 6 weeks ago which he forgot to obtain.  No specific complaints today, states that he has not had any recurrence of angina.  Past Medical History:  Diagnosis Date  . Arthritis    "a little all over my body" (06/01/2018)  . Chronic lower back pain   . GERD (gastroesophageal reflux disease)   . History of BPH   . Hyperlipidemia   . Hypertension   . RLS (restless legs syndrome)   . Ventral hernia   . Vertigo     Past Surgical History:  Procedure Laterality Date  . BACK SURGERY    . CORONARY ANGIOPLASTY WITH STENT PLACEMENT  06/01/2018  . CORONARY STENT INTERVENTION N/A 06/01/2018   Procedure: CORONARY STENT INTERVENTION;  Surgeon: Adrian Prows, MD;  Location: [place] [facility];  Service: Cardiovascular;  Laterality: N/A;  . HAMMER TOE SURGERY Left 2012  . JOINT REPLACEMENT    . LEFT HEART CATH AND CORONARY ANGIOGRAPHY N/A 06/01/2018   Procedure: LEFT HEART CATH AND CORONARY ANGIOGRAPHY;  Surgeon: Adrian Prows, MD;  Location: [place] [facility];  Service: Cardiovascular;  Laterality: N/A;  . LUMBAR [place]     "I had 2 other lower back ORs before the one  in 2015"  . LUMBAR LAMINECTOMY/DECOMPRESSION MICRODISCECTOMY N/A 05/29/2014   Procedure: CENTRAL LUMBAR LAMINECTOMY L3-4 and L4-5;  Surgeon: Jessy Oto, MD;  Location: [place];  Service: Orthopedics;  Laterality: N/A;  . TOTAL KNEE ARTHROPLASTY Left 03/31/2017   Procedure: LEFT TOTAL KNEE ARTHROPLASTY;  Surgeon: Meredith Pel, MD;  Location: [place];  Service: Orthopedics;  Laterality: Left;  . TOTAL KNEE ARTHROPLASTY Right 05/24/2019   Procedure: RIGHT TOTAL KNEE ARTHROPLASTY;  Surgeon: Meredith Pel, MD;  Location: [place];  Service: Orthopedics;  Laterality: Right;   Social History   Tobacco Use  . Smoking status: Never Smoker  . Smokeless tobacco: Never Used  Substance Use Topics  . Alcohol use: Not Currently    Comment: 06/01/2018  "probably have 3 beers/month"    Family History  Problem Relation Age of Onset  . Osteoarthritis Mother     Review of Systems  Cardiovascular: Positive for dyspnea on exertion and leg swelling. Negative for chest pain.  Gastrointestinal: Negative for melena.  Neurological: Positive for loss of balance.   Objective:  Blood pressure 126/68, pulse 73, temperature 98.3 F (36.8 C), height 6' 1"  (1.854 m), weight 245 lb (111.1 kg), SpO2 96 %. Body mass index is 32.32 kg/m.   Vitals with BMI 10/19/2019 08/29/2019 08/02/2019  Height 6' 1"  6' 1"  6' 1"   Weight 245  lbs 244 lbs 244 lbs  BMI 32.33 74.0 81.4  Systolic 481 856 314  Diastolic 68 73 77  Pulse 73 68 71      Physical Exam  Constitutional: Vital signs are normal. He appears well-developed and well-nourished.  Mildly obese  Cardiovascular: Normal rate, regular rhythm, normal heart sounds and intact distal pulses.  Pulses:      Femoral pulses are 2+ on the right side with bruit and 2+ on the left side with bruit.      Popliteal pulses are 0 on the right side and 2+ on the left side.       Dorsalis pedis pulses are 0 on the right side and 0 on the left side.       Posterior tibial pulses are  0 on the right side and 0 on the left side.  RLE: generalized 2+ pitting edema. Erythema. No tenderness.   Pulmonary/Chest: Effort normal and breath sounds normal. No accessory muscle usage. No respiratory distress.  Abdominal: Soft. Bowel sounds are normal.  Vitals reviewed.  Radiology: No results found.  Laboratory examination:    CMP Latest Ref Rng & Units 05/18/2019 07/30/2018 06/02/2018  Glucose 70 - 99 mg/dL 112(H) - 116(H)  BUN 8 - 23 mg/dL 18 - 17  Creatinine 0.61 - 1.24 mg/dL 0.95 - 1.05  Sodium 135 - 145 mmol/L 140 - 139  Potassium 3.5 - 5.1 mmol/L 4.0 - 3.7  Chloride 98 - 111 mmol/L 109 - 107  CO2 22 - 32 mmol/L 21(L) - 23  Calcium 8.9 - 10.3 mg/dL 9.4 - 8.8(L)  Total Protein 6.0 - 8.5 g/dL - 6.6 -  Total Bilirubin 0.3 - 1.2 mg/dL - - -  Alkaline Phos 39 - 117 U/L - - -  AST 0 - 37 U/L - - -  ALT 0 - 53 U/L - - -   CBC Latest Ref Rng & Units 05/18/2019 06/02/2018 03/20/2017  WBC 4.0 - 10.5 K/uL 5.6 7.1 7.0  Hemoglobin 13.0 - 17.0 g/dL 14.6 12.6(L) 14.9  Hematocrit 39.0 - 52.0 % 45.3 39.5 43.8  Platelets 150 - 400 K/uL 152 144(L) 148(L)   Lipid Panel  No results found for: CHOL, TRIG, HDL, CHOLHDL, VLDL, LDLCALC, LDLDIRECT HEMOGLOBIN A1C Lab Results  Component Value Date   HGBA1C 5.6 07/30/2018   TSH No results for input(s): TSH in the last 8760 hours.   External labs: 07/06/2018: Cholesterol 120, triglycerides 104, HDL 37, LDL 62.  03/30/2018: eGFR 74, Creatinine 1.1, Potassium 4.9, BMP normal. Platelets 138, CBC otherwise normal. TSH 2.5. HB 14.0/HCT 41.9, platelets 138,000. Vitamin B12 373.  PRN Meds:. There are no discontinued medications. Current Meds  Medication Sig  . amLODipine (NORVASC) 5 MG tablet Take 1 tablet (5 mg total) by mouth daily.  [name] aspirin EC 81 MG tablet Take 81 mg by mouth daily.  [name] atorvastatin (LIPITOR) 20 MG tablet Take 1 tablet (20 mg total) by mouth daily.  . clopidogrel (PLAVIX) 75 MG tablet Take 1 tablet (75 mg total) by mouth  daily with breakfast.  . isosorbide mononitrate (IMDUR) 30 MG 24 hr tablet Take 1 tablet (30 mg total) by mouth daily.  . meclizine (ANTIVERT) 25 MG tablet Take 25 mg by mouth 3 (three) times daily as needed for dizziness.  . nitroGLYCERIN (NITROSTAT) 0.4 MG SL tablet Place 0.4 mg under the tongue every 5 (five) minutes as needed for chest pain.  [name] oxyCODONE (OXY IR/ROXICODONE) 5 MG immediate release tablet Take 1 tablet (  5 mg total) by mouth daily as needed for moderate pain (pain score 4-6).  . pantoprazole (PROTONIX) 40 MG tablet TAKE 1 TABLET(40 MG) BY MOUTH DAILY (Patient taking differently: Take 40 mg by mouth daily. )  . pramipexole (MIRAPEX) 0.5 MG tablet TAKE 1 TABLET BY MOUTH EVERY NIGHT AT BEDTIME FOR RESTLESS LEG. (Patient taking differently: Take 0.5 mg by mouth at bedtime. )  . terazosin (HYTRIN) 5 MG capsule Take 5 mg by mouth at bedtime.   [name] tiZANidine (ZANAFLEX) 2 MG tablet Take 1 tablet (2 mg total) by mouth every 8 (eight) hours as needed.  . valsartan-hydrochlorothiazide (DIOVAN HCT) 160-12.5 MG tablet Take 1 tablet by mouth daily.  . [DISCONTINUED] metoprolol succinate (TOPROL-XL) 25 MG 24 hr tablet Take 1 tablet (25 mg total) by mouth daily.    Cardiac Studies:   Coronary angiogram 06/01/2018: LM short normal. LAD prox to mid 80-99% S/P 2.5x40, 2.75x15 and 2.5x13 mm Orserio DES. OM1 80% S/P 2.5x13 mm Orserio DES. All stenosis reduced to 0%. Mid RCA 50%. Normal LV.  Echocardiogram 08/22/2019: Left ventricle cavity is normal in size. Moderate concentric hypertrophy of the left ventricle. Normal global wall motion. Normal LV systolic function with EF 55%. Doppler evidence of grade I (impaired) diastolic dysfunction, normal LAP.  Trileaflet aortic valve with mild aortic valve leaflet calcification. Moderate (Grade II) aortic regurgitation. Moderate (Grade II) mitral regurgitation. Inadequate TR jet to estimate pulmonary artery systolic pressure. Normal right atrial pressure.    No significant change compared to previous study on 08/01/2019.  Lexiscan Tetrofosmin Stress Test  08/08/2019: Nondiagnostic ECG stress. Resting SR, LAD, LAFB, RBBB, trifascicular block. CRO inferior infarct, old.  Perfusion images reveal a moderate to large sized inferior, inferoapical and apical septal scar with mild peri-infarct ischemia in the distribution of right coronary artery or a dominant circumflex coronary artery. Overall function is abnormal with regional wall motion abnormalities. Stress LV EF: 49%. Severely elevated LV end diastolic volume of 628 mL in both rest and stress images.  Intermediate risk study.  Compared to 04/02/2018, no significant change in ischemic findings however, LV dilatation and mild decrease in LV function is new.  Assessment:     ICD-10-CM   1. Atherosclerosis of native coronary artery of native heart with stable angina pectoris ([place])  I25.118   2. Primary hypertension  I10   3. Mild hyperlipidemia  E78.5   4. Trifascicular block  I45.3     EKG 07/19/2019: Normal sinus rhythm at 76 bpm with first degree AV block, left axis deviation, left anterior fascicular block. Right bundle branch block. Trifasicular block. No changes from EKG 06/09/2018    Recommendations:   Cody Floyd  is a 79 y.o. male  With lumbar radiculopathy with gait imbalance, in which he uses a cane, GERD, hypertension, migraines, and hyperlipidemia, CAD s/p complex intervention to the proximal LAD implantation of 3 overlapping stents and also obtuse marginal 1 stenosis S/P drug-eluting stent placed on 06/01/2018. He has been using compression stockings that have been helping with this.  No PND or orthopnea.  No shortness of breath at rest or hemoptysis.  He remains angina free, no significant change in his leg edema, he forgot to obtain the labs, orders in place.  For now he is presently on aspirin and Plavix, will consider discontinuing this on his next office visit in 6 months.  On his  last office visit, metoprolol was added for hypertension.  However he never picked up the medication, today the  blood pressure is well controlled.  In view of moderate aortic regurgitation, presence of trifascicular block, will not initiate beta-blocker therapy for now.  Adrian Prows, MD, [facility] 10/23/2019, 8:16 AM [facility]. [place] Office: [phone]

## 2019-10-21 ENCOUNTER — Other Ambulatory Visit: Payer: Self-pay | Admitting: Cardiology

## 2019-10-21 ENCOUNTER — Ambulatory Visit: Payer: Medicare Other | Attending: Internal Medicine

## 2019-10-21 DIAGNOSIS — Z23 Encounter for immunization: Secondary | ICD-10-CM

## 2019-10-21 NOTE — Progress Notes (Signed)
   Covid-19 Vaccination Clinic  Name:  Cody Floyd    MRN: [id number] DOB: 1940/11/25  10/21/2019  [name] was observed post Covid-19 immunization for 15 minutes without incidence. He was provided with Vaccine Information Sheet and instruction to access the V-Safe system.   [name] was instructed to call 911 with any severe reactions post vaccine: [name] Difficulty breathing  . Swelling of your face and throat  . A fast heartbeat  . A bad rash all over your body  . Dizziness and weakness    Immunizations Administered    Name Date Dose VIS Date Route   Pfizer COVID-19 Vaccine 10/21/2019  8:24 AM 0.3 mL 08/05/2019 Intramuscular   Manufacturer: [place]   Lot: [id number]   [place]: [id number]

## 2019-11-02 ENCOUNTER — Ambulatory Visit: Payer: Medicare Other

## 2019-11-05 DIAGNOSIS — R05 Cough: Secondary | ICD-10-CM | POA: Diagnosis not present

## 2019-11-06 ENCOUNTER — Emergency Department (HOSPITAL_COMMUNITY): Payer: Medicare Other

## 2019-11-06 ENCOUNTER — Encounter (HOSPITAL_COMMUNITY): Payer: Self-pay

## 2019-11-06 ENCOUNTER — Inpatient Hospital Stay (HOSPITAL_COMMUNITY)
Admission: EM | Admit: 2019-11-06 | Discharge: 2019-11-11 | DRG: 177 | Disposition: A | Discharge status: Home Health | Payer: Medicare Other | Attending: Internal Medicine | Admitting: Internal Medicine

## 2019-11-06 DIAGNOSIS — I10 Essential (primary) hypertension: Secondary | ICD-10-CM | POA: Diagnosis not present

## 2019-11-06 DIAGNOSIS — M48062 Spinal stenosis, lumbar region with neurogenic claudication: Secondary | ICD-10-CM | POA: Diagnosis not present

## 2019-11-06 DIAGNOSIS — Z7902 Long term (current) use of antithrombotics/antiplatelets: Secondary | ICD-10-CM | POA: Diagnosis not present

## 2019-11-06 DIAGNOSIS — Z96653 Presence of artificial knee joint, bilateral: Secondary | ICD-10-CM | POA: Diagnosis present

## 2019-11-06 DIAGNOSIS — Z7982 Long term (current) use of aspirin: Secondary | ICD-10-CM

## 2019-11-06 DIAGNOSIS — J1282 Pneumonia due to coronavirus disease 2019: Secondary | ICD-10-CM | POA: Diagnosis present

## 2019-11-06 DIAGNOSIS — E785 Hyperlipidemia, unspecified: Secondary | ICD-10-CM | POA: Diagnosis not present

## 2019-11-06 DIAGNOSIS — G8929 Other chronic pain: Secondary | ICD-10-CM | POA: Diagnosis present

## 2019-11-06 DIAGNOSIS — M545 Low back pain: Secondary | ICD-10-CM | POA: Diagnosis not present

## 2019-11-06 DIAGNOSIS — Z79899 Other long term (current) drug therapy: Secondary | ICD-10-CM | POA: Diagnosis not present

## 2019-11-06 DIAGNOSIS — J9601 Acute respiratory failure with hypoxia: Secondary | ICD-10-CM | POA: Diagnosis present

## 2019-11-06 DIAGNOSIS — G2581 Restless legs syndrome: Secondary | ICD-10-CM | POA: Diagnosis not present

## 2019-11-06 DIAGNOSIS — K219 Gastro-esophageal reflux disease without esophagitis: Secondary | ICD-10-CM | POA: Diagnosis not present

## 2019-11-06 DIAGNOSIS — Z955 Presence of coronary angioplasty implant and graft: Secondary | ICD-10-CM | POA: Diagnosis not present

## 2019-11-06 DIAGNOSIS — I739 Peripheral vascular disease, unspecified: Secondary | ICD-10-CM | POA: Diagnosis present

## 2019-11-06 DIAGNOSIS — R05 Cough: Secondary | ICD-10-CM | POA: Diagnosis not present

## 2019-11-06 DIAGNOSIS — N400 Enlarged prostate without lower urinary tract symptoms: Secondary | ICD-10-CM | POA: Diagnosis present

## 2019-11-06 DIAGNOSIS — I2510 Atherosclerotic heart disease of native coronary artery without angina pectoris: Secondary | ICD-10-CM | POA: Diagnosis present

## 2019-11-06 DIAGNOSIS — U071 COVID-19: Secondary | ICD-10-CM

## 2019-11-06 DIAGNOSIS — J069 Acute upper respiratory infection, unspecified: Secondary | ICD-10-CM | POA: Diagnosis not present

## 2019-11-06 DIAGNOSIS — R54 Age-related physical debility: Secondary | ICD-10-CM | POA: Diagnosis present

## 2019-11-06 HISTORY — DX: Atherosclerotic heart disease of native coronary artery without angina pectoris: I25.10

## 2019-11-06 LAB — C-REACTIVE PROTEIN: CRP: 3 mg/dL — ABNORMAL HIGH (ref <1.0)

## 2019-11-06 LAB — CBC WITH DIFFERENTIAL/PLATELET
Abs Immature Granulocytes: 0.01 K/uL (ref 0.00–0.07)
Basophils Absolute: 0 K/uL (ref 0.0–0.1)
Basophils Relative: 0 %
Eosinophils Absolute: 0 K/uL (ref 0.0–0.5)
Eosinophils Relative: 0 %
HCT: 43.5 % (ref 39.0–52.0)
Hemoglobin: 14.5 g/dL (ref 13.0–17.0)
Immature Granulocytes: 0 %
Lymphocytes Relative: 23 %
Lymphs Abs: 0.9 K/uL (ref 0.7–4.0)
MCH: 27 pg (ref 26.0–34.0)
MCHC: 33.3 g/dL (ref 30.0–36.0)
MCV: 80.9 fL (ref 80.0–100.0)
Monocytes Absolute: 0.2 K/uL (ref 0.1–1.0)
Monocytes Relative: 5 %
Neutro Abs: 2.9 K/uL (ref 1.7–7.7)
Neutrophils Relative %: 72 %
Platelets: 94 K/uL — ABNORMAL LOW (ref 150–400)
RBC: 5.38 MIL/uL (ref 4.22–5.81)
RDW: 15.6 % — ABNORMAL HIGH (ref 11.5–15.5)
WBC: 4 K/uL (ref 4.0–10.5)
nRBC: 0 % (ref 0.0–0.2)

## 2019-11-06 LAB — LACTIC ACID, PLASMA: Lactic Acid, Venous: 1.3 mmol/L (ref 0.5–1.9)

## 2019-11-06 LAB — D-DIMER, QUANTITATIVE: D-Dimer, Quant: 2.63 ug/mL-FEU — ABNORMAL HIGH (ref 0.00–0.50)

## 2019-11-06 LAB — FIBRINOGEN: Fibrinogen: 499 mg/dL — ABNORMAL HIGH (ref 210–475)

## 2019-11-06 LAB — BASIC METABOLIC PANEL WITH GFR
Anion gap: 11 (ref 5–15)
BUN: 20 mg/dL (ref 8–23)
CO2: 20 mmol/L — ABNORMAL LOW (ref 22–32)
Calcium: 8.2 mg/dL — ABNORMAL LOW (ref 8.9–10.3)
Chloride: 103 mmol/L (ref 98–111)
Creatinine, Ser: 1.09 mg/dL (ref 0.61–1.24)
GFR calc Af Amer: >60 mL/min (ref >60)
GFR calc non Af Amer: >60 mL/min (ref >60)
Glucose, Bld: 107 mg/dL — ABNORMAL HIGH (ref 70–99)
Potassium: 3.9 mmol/L (ref 3.5–5.1)
Sodium: 134 mmol/L — ABNORMAL LOW (ref 135–145)

## 2019-11-06 LAB — LACTATE DEHYDROGENASE: LDH: 336 U/L — ABNORMAL HIGH (ref 98–192)

## 2019-11-06 LAB — PROCALCITONIN: Procalcitonin: <0.1 ng/mL

## 2019-11-06 LAB — FERRITIN: Ferritin: 363 ng/mL — ABNORMAL HIGH (ref 24–336)

## 2019-11-06 MED ORDER — ALBUTEROL SULFATE HFA 108 (90 BASE) MCG/ACT IN AERS
2.0000 | INHALATION_SPRAY | RESPIRATORY_TRACT | Status: DC | PRN
  Administered 2019-11-06: 2 via RESPIRATORY_TRACT
  Filled 2019-11-06: qty 6.7

## 2019-11-06 MED ORDER — AMLODIPINE BESYLATE 5 MG PO TABS
5.0000 mg | ORAL_TABLET | Freq: Every day | ORAL | Status: DC
  Administered 2019-11-07 – 2019-11-11 (×5): 5 mg via ORAL
  Filled 2019-11-06 (×5): qty 1

## 2019-11-06 MED ORDER — ASPIRIN EC 81 MG PO TBEC
81.0000 mg | DELAYED_RELEASE_TABLET | Freq: Every day | ORAL | Status: DC
  Administered 2019-11-07 – 2019-11-11 (×5): 81 mg via ORAL
  Filled 2019-11-06 (×5): qty 1

## 2019-11-06 MED ORDER — SODIUM CHLORIDE 0.9 % IV SOLN: INTRAVENOUS | Status: DC

## 2019-11-06 MED ORDER — SODIUM CHLORIDE 0.9 % IV SOLN
100.0000 mg | Freq: Once | INTRAVENOUS | Status: AC
  Administered 2019-11-06: 100 mg via INTRAVENOUS
  Filled 2019-11-06: qty 20

## 2019-11-06 MED ORDER — SODIUM CHLORIDE 0.9 % IV SOLN: 200.0000 mg | Freq: Once | INTRAVENOUS | Status: DC

## 2019-11-06 MED ORDER — GUAIFENESIN-DM 100-10 MG/5ML PO SYRP
10.0000 mL | ORAL_SOLUTION | ORAL | Status: DC | PRN
  Filled 2019-11-06: qty 10

## 2019-11-06 MED ORDER — BENZONATATE 100 MG PO CAPS
100.0000 mg | ORAL_CAPSULE | Freq: Three times a day (TID) | ORAL | Status: DC | PRN
  Administered 2019-11-06 – 2019-11-07 (×3): 100 mg via ORAL
  Filled 2019-11-06 (×3): qty 1

## 2019-11-06 MED ORDER — SODIUM CHLORIDE 0.9 % IV SOLN
1.0000 g | Freq: Once | INTRAVENOUS | Status: AC
  Administered 2019-11-06: 1 g via INTRAVENOUS
  Filled 2019-11-06: qty 10

## 2019-11-06 MED ORDER — POLYETHYLENE GLYCOL 3350 17 G PO PACK: 17.0000 g | PACK | Freq: Every day | ORAL | Status: DC | PRN

## 2019-11-06 MED ORDER — SODIUM CHLORIDE 0.9 % IV SOLN
100.0000 mg | Freq: Every day | INTRAVENOUS | Status: AC
  Administered 2019-11-07 – 2019-11-10 (×4): 100 mg via INTRAVENOUS
  Filled 2019-11-06 (×4): qty 20

## 2019-11-06 MED ORDER — SODIUM CHLORIDE 0.9% FLUSH: 3.0000 mL | INTRAVENOUS | Status: DC | PRN

## 2019-11-06 MED ORDER — ALBUTEROL SULFATE HFA 108 (90 BASE) MCG/ACT IN AERS
2.0000 | INHALATION_SPRAY | Freq: Once | RESPIRATORY_TRACT | Status: AC
  Administered 2019-11-06: 2 via RESPIRATORY_TRACT
  Filled 2019-11-06: qty 6.7

## 2019-11-06 MED ORDER — BENZONATATE 100 MG PO CAPS
200.0000 mg | ORAL_CAPSULE | Freq: Once | ORAL | Status: AC
  Administered 2019-11-06: 200 mg via ORAL
  Filled 2019-11-06: qty 2

## 2019-11-06 MED ORDER — SODIUM CHLORIDE 0.9% FLUSH
3.0000 mL | Freq: Two times a day (BID) | INTRAVENOUS | Status: DC
  Administered 2019-11-06 – 2019-11-11 (×10): 3 mL via INTRAVENOUS

## 2019-11-06 MED ORDER — ENOXAPARIN SODIUM 40 MG/0.4ML ~~LOC~~ SOLN
40.0000 mg | SUBCUTANEOUS | Status: DC
  Administered 2019-11-06 – 2019-11-11 (×6): 40 mg via SUBCUTANEOUS
  Filled 2019-11-06 (×6): qty 0.4

## 2019-11-06 MED ORDER — ATORVASTATIN CALCIUM 10 MG PO TABS
20.0000 mg | ORAL_TABLET | Freq: Every day | ORAL | Status: DC
  Administered 2019-11-07 – 2019-11-11 (×5): 20 mg via ORAL
  Filled 2019-11-06 (×5): qty 2

## 2019-11-06 MED ORDER — TIZANIDINE HCL 4 MG PO TABS: 2.0000 mg | ORAL_TABLET | Freq: Three times a day (TID) | ORAL | Status: DC | PRN

## 2019-11-06 MED ORDER — METHYLPREDNISOLONE SODIUM SUCC 125 MG IJ SOLR
50.0000 mg | Freq: Two times a day (BID) | INTRAMUSCULAR | Status: DC
  Administered 2019-11-06 – 2019-11-09 (×6): 50 mg via INTRAVENOUS
  Filled 2019-11-06 (×6): qty 2

## 2019-11-06 MED ORDER — FLEET ENEMA 7-19 GM/118ML RE ENEM: 1.0000 | ENEMA | Freq: Once | RECTAL | Status: DC | PRN

## 2019-11-06 MED ORDER — ONDANSETRON HCL 4 MG PO TABS: 4.0000 mg | ORAL_TABLET | Freq: Four times a day (QID) | ORAL | Status: DC | PRN

## 2019-11-06 MED ORDER — PANTOPRAZOLE SODIUM 40 MG PO TBEC
40.0000 mg | DELAYED_RELEASE_TABLET | Freq: Every day | ORAL | Status: DC
  Administered 2019-11-07 – 2019-11-11 (×5): 40 mg via ORAL
  Filled 2019-11-06 (×5): qty 1

## 2019-11-06 MED ORDER — PREDNISONE 20 MG PO TABS
40.0000 mg | ORAL_TABLET | Freq: Once | ORAL | Status: AC
  Administered 2019-11-06: 40 mg via ORAL
  Filled 2019-11-06: qty 2

## 2019-11-06 MED ORDER — OXYCODONE HCL 5 MG PO TABS
5.0000 mg | ORAL_TABLET | ORAL | Status: DC | PRN
  Administered 2019-11-06: 5 mg via ORAL
  Filled 2019-11-06: qty 1

## 2019-11-06 MED ORDER — BISACODYL 5 MG PO TBEC: 5.0000 mg | DELAYED_RELEASE_TABLET | Freq: Every day | ORAL | Status: DC | PRN

## 2019-11-06 MED ORDER — PRAMIPEXOLE DIHYDROCHLORIDE 0.25 MG PO TABS
0.5000 mg | ORAL_TABLET | Freq: Every day | ORAL | Status: DC
  Administered 2019-11-06 – 2019-11-10 (×5): 0.5 mg via ORAL
  Filled 2019-11-06 (×6): qty 2

## 2019-11-06 MED ORDER — ONDANSETRON HCL 4 MG/2ML IJ SOLN: 4.0000 mg | Freq: Four times a day (QID) | INTRAMUSCULAR | Status: DC | PRN

## 2019-11-06 MED ORDER — HYDROCOD POLST-CPM POLST ER 10-8 MG/5ML PO SUER
5.0000 mL | Freq: Two times a day (BID) | ORAL | Status: DC | PRN
  Administered 2019-11-06 – 2019-11-07 (×2): 5 mL via ORAL
  Filled 2019-11-06 (×2): qty 5

## 2019-11-06 MED ORDER — CLOPIDOGREL BISULFATE 75 MG PO TABS
75.0000 mg | ORAL_TABLET | Freq: Every day | ORAL | Status: DC
  Administered 2019-11-07 – 2019-11-11 (×5): 75 mg via ORAL
  Filled 2019-11-06 (×5): qty 1

## 2019-11-06 MED ORDER — ACETAMINOPHEN 325 MG PO TABS: 650.0000 mg | ORAL_TABLET | Freq: Four times a day (QID) | ORAL | Status: DC | PRN

## 2019-11-06 MED ORDER — SODIUM CHLORIDE 0.9 % IV SOLN: 100.0000 mg | Freq: Every day | INTRAVENOUS | Status: DC

## 2019-11-06 MED ORDER — SODIUM CHLORIDE 0.9 % IV SOLN
500.0000 mg | Freq: Once | INTRAVENOUS | Status: AC
  Administered 2019-11-06: 500 mg via INTRAVENOUS
  Filled 2019-11-06: qty 500

## 2019-11-06 MED ORDER — ISOSORBIDE MONONITRATE ER 30 MG PO TB24
30.0000 mg | ORAL_TABLET | Freq: Every day | ORAL | Status: DC
  Administered 2019-11-07 – 2019-11-11 (×5): 30 mg via ORAL
  Filled 2019-11-06 (×5): qty 1

## 2019-11-06 MED ORDER — METOPROLOL SUCCINATE ER 25 MG PO TB24: 25.0000 mg | ORAL_TABLET | Freq: Every day | ORAL | Status: DC

## 2019-11-06 MED ORDER — SODIUM CHLORIDE 0.9 % IV SOLN: 250.0000 mL | INTRAVENOUS | Status: DC | PRN

## 2019-11-06 MED ORDER — TERAZOSIN HCL 5 MG PO CAPS
5.0000 mg | ORAL_CAPSULE | Freq: Every day | ORAL | Status: DC
  Administered 2019-11-06 – 2019-11-10 (×5): 5 mg via ORAL
  Filled 2019-11-06 (×6): qty 1

## 2019-11-06 NOTE — ED Notes (Signed)
Pts O2 saturation maintained at 93-94% while ambulating around the room.

## 2019-11-06 NOTE — ED Provider Notes (Signed)
[facility] EMERGENCY DE[facility]MENT Provider Note   CSN: [id number] Arrival date & time: 11/06/19  1314     History No chief complaint on file.   C[facility] is a 79 y.o. male.  HPI   This patient is a 79 y/o male - presents after being seen at [facility] yesterday for the same complaints of shortness of breath (mild), and cough (severe), he has no fevers but has some aches, has loss of sense of taste and smell.  No diarrhea - he has not had much to eat in the last  Couple of days and feels that he is gaging on some mucus.  His family has had similar sx - he tested positive for Covid yesterday and was given augmentin and zithromax after having and xray read as a hazy opacity at the base of the lung in the chest at the [facility].  He is not any better today after 1 dose of medicine - requesting additional evaluation and treatments.  He did get the first part of the vaccine for Covid 19 in the end of Feb 2021.  Sx are constant, nothing makes better or worse - no assocaited fevers at this time.  Past Medical History:  Diagnosis Date  . Arthritis    "a little all over my body" (06/01/2018)  . Chronic lower back pain   . GERD (gastroesophageal reflux disease)   . History of BPH   . Hyperlipidemia   . Hypertension   . RLS (restless legs syndrome)   . Ventral hernia   . Vertigo     Patient Active Problem List   Diagnosis Date Noted  . Arthritis of right knee 05/24/2019  . Peripheral arterial disease ([place]) 03/08/2019  . Post PTCA 06/01/2018  . Angina pectoris ([place]) 05/31/2018  . Arthritis of knee 03/31/2017  . Spinal stenosis, lumbar region, with neurogenic claudication 05/29/2014    Class: Chronic  . Left lumbar radiculopathy 05/29/2014    Class: Chronic    Past Surgical History:  Procedure Laterality Date  . BACK SURGERY    . CORONARY ANGIOPLASTY WITH STENT PLACEMENT  06/01/2018  . CORONARY STENT INTERVENTION N/A 06/01/2018   Procedure: CORONARY STENT INTERVENTION;   Surgeon: Adrian Prows, MD;  Location: [place] CV [facility];  Service: Cardiovascular;  Laterality: N/A;  . HAMMER TOE SURGERY Left 2012  . JOINT REPLACEMENT    . LEFT HEART CATH AND CORONARY ANGIOGRAPHY N/A 06/01/2018   Procedure: LEFT HEART CATH AND CORONARY ANGIOGRAPHY;  Surgeon: Adrian Prows, MD;  Location: [place] CV LAB;  Service: Cardiovascular;  Laterality: N/A;  . LUMBAR [place]     "I had 2 other lower back ORs before the one in 2015"  . LUMBAR LAMINECTOMY/DECOMPRESSION MICRODISCECTOMY N/A 05/29/2014   Procedure: CENTRAL LUMBAR LAMINECTOMY L3-4 and L4-5;  Surgeon: Jessy Oto, MD;  Location: [place];  Service: Orthopedics;  Laterality: N/A;  . TOTAL KNEE ARTHROPLASTY Left 03/31/2017   Procedure: LEFT TOTAL KNEE ARTHROPLASTY;  Surgeon: Meredith Pel, MD;  Location: [place];  Service: Orthopedics;  Laterality: Left;  . TOTAL KNEE ARTHROPLASTY Right 05/24/2019   Procedure: RIGHT TOTAL KNEE ARTHROPLASTY;  Surgeon: Meredith Pel, MD;  Location: [place];  Service: Orthopedics;  Laterality: Right;       Family History  Problem Relation Age of Onset  . Osteoarthritis Mother     Social History   Tobacco Use  . Smoking status: Never Smoker  . Smokeless tobacco: Never Used  Substance  Use Topics  . Alcohol use: Not Currently    Comment: 06/01/2018  "probably have 3 beers/month"  . Drug use: Never    Home Medications Prior to Admission medications   Medication Sig Start Date End Date Taking? Authorizing Provider  amLODipine (NORVASC) 5 MG tablet Take 1 tablet (5 mg total) by mouth daily. 07/19/19 10/19/19  Miquel Dunn, NP  aspirin EC 81 MG tablet Take 81 mg by mouth daily.    [provider]  atorvastatin (LIPITOR) 20 MG tablet Take 1 tablet (20 mg total) by mouth daily. 07/19/19   Miquel Dunn, NP  clopidogrel (PLAVIX) 75 MG tablet Take 1 tablet (75 mg total) by mouth daily with breakfast. 11/30/18   Adrian Prows, MD  isosorbide mononitrate (IMDUR) 30  MG 24 hr tablet Take 1 tablet (30 mg total) by mouth daily. 07/20/19 10/19/19  Miquel Dunn, NP  meclizine (ANTIVERT) 25 MG tablet Take 25 mg by mouth 3 (three) times daily as needed for dizziness.    [provider]  metoprolol succinate (TOPROL-XL) 25 MG 24 hr tablet TAKE 1 TABLET(25 MG) BY MOUTH DAILY 10/21/19   Miquel Dunn, NP  nitroGLYCERIN (NITROSTAT) 0.4 MG SL tablet Place 0.4 mg under the tongue every 5 (five) minutes as needed for chest pain.    [provider]  oxyCODONE (OXY IR/ROXICODONE) 5 MG immediate release tablet Take 1 tablet (5 mg total) by mouth daily as needed for moderate pain (pain score 4-6). 07/08/19   Magnant, Charles L, PA-C  pantoprazole (PROTONIX) 40 MG tablet TAKE 1 TABLET(40 MG) BY MOUTH DAILY Patient taking differently: Take 40 mg by mouth daily.  09/14/19   Adrian Prows, MD  pramipexole (MIRAPEX) 0.5 MG tablet TAKE 1 TABLET BY MOUTH EVERY NIGHT AT BEDTIME FOR RESTLESS LEG. Patient taking differently: Take 0.5 mg by mouth at bedtime.  10/18/19   Magnant, Charles L, PA-C  terazosin (HYTRIN) 5 MG capsule Take 5 mg by mouth at bedtime.  05/11/17   [provider]  tiZANidine (ZANAFLEX) 2 MG tablet Take 1 tablet (2 mg total) by mouth every 8 (eight) hours as needed. 06/08/19   Magnant, Charles L, PA-C  valsartan-hydrochlorothiazide (DIOVAN HCT) 160-12.5 MG tablet Take 1 tablet by mouth daily. 08/02/19   Miquel Dunn, NP    Allergies    Patient has no known allergies.  Review of Systems   Review of Systems  All other systems reviewed and are negative.   Physical Exam Updated Vital Signs BP (!) 119/100   Pulse 77   Temp 98.7 F (37.1 C) (Oral)   Resp (!) 27   SpO2 91%   Physical Exam Vitals and nursing note reviewed.  Constitutional:      General: He is not in acute distress.    Appearance: He is well-developed.  HENT:     Head: Normocephalic and atraumatic.     Mouth/Throat:     Pharynx: No oropharyngeal  exudate.  Eyes:     General: No scleral icterus.       Right eye: No discharge.        Left eye: No discharge.     Conjunctiva/sclera: Conjunctivae normal.     Pupils: Pupils are equal, round, and reactive to light.  Neck:     Thyroid: No thyromegaly.     Vascular: No JVD.  Cardiovascular:     Rate and Rhythm: Normal rate and regular rhythm.     Heart sounds: Normal heart sounds. No  murmur. No friction rub. No gallop.   Pulmonary:     Effort: Pulmonary effort is normal. No respiratory distress.     Breath sounds: Normal breath sounds. No wheezing or rales.     Comments: No rales, lungs clear, frequent coughing spells. Abdominal:     General: Bowel sounds are normal. There is no distension.     Palpations: Abdomen is soft. There is no mass.     Tenderness: There is no abdominal tenderness.  Musculoskeletal:        General: No tenderness. Normal range of motion.     Cervical back: Normal range of motion and neck supple.  Lymphadenopathy:     Cervical: No cervical adenopathy.  Skin:    General: Skin is warm and dry.     Findings: No erythema or rash.  Neurological:     Mental Status: He is alert.     Coordination: Coordination normal.     Comments: Stable gait - clear speech, no facial droop - no focal weakness  Psychiatric:        Behavior: Behavior normal.     ED Results / Procedures / Treatments   Labs (all labs ordered are listed, but only abnormal results are displayed) Labs Reviewed  CBC WITH DIFFERENTIAL/PLATELET - Abnormal; Notable for the following components:      Result Value   RDW 15.6 (*)    Platelets 94 (*)    All other components within normal limits  BASIC METABOLIC PANEL - Abnormal; Notable for the following components:   Sodium 134 (*)    CO2 20 (*)    Glucose, Bld 107 (*)    Calcium 8.2 (*)    All other components within normal limits  LACTIC ACID, PLASMA    EKG None  Radiology DG Chest Port 1 View  Result Date: 11/06/2019 CLINICAL DATA:   Cough. COVID positive. EXAM: PORTABLE CHEST 1 VIEW COMPARISON:  03/20/2017 chest radiograph FINDINGS: UPPER limits normal heart size noted. Patchy RIGHT LOWER lung opacity noted likely representing infection/pneumonia. No pulmonary mass, pleural effusion or pneumothorax. No acute bony abnormalities are present. IMPRESSION: Patchy RIGHT LOWER lung opacity likely representing infection/pneumonia. Electronically Signed   By: Margarette Canada M.D.   On: 11/06/2019 14:26    Procedures Procedures (including critical care time)  Medications Ordered in ED Medications  0.9 %  sodium chloride infusion (has no administration in time range)  cefTRIAXone (ROCEPHIN) 1 g in sodium chloride 0.9 % 100 mL IVPB (has no administration in time range)  azithromycin (ZITHROMAX) 500 mg in sodium chloride 0.9 % 250 mL IVPB (has no administration in time range)  albuterol (VENTOLIN HFA) 108 (90 Base) MCG/ACT inhaler 2 puff (2 puffs Inhalation Given 11/06/19 1535)  benzonatate (TESSALON) capsule 200 mg (200 mg Oral Given 11/06/19 1533)  predniSONE (DELTASONE) tablet 40 mg (40 mg Oral Given 11/06/19 1534)    ED Course  I have reviewed the triage vital signs and the nursing notes.  Pertinent labs & imaging results that were available during my care of the patient were reviewed by me and considered in my medical decision making (see chart for details).    MDM Rules/Calculators/A&P                      This patient does not appear overtly ill or toxic however he does have frequent coughing spells which may benefit from albuterol and benzonatate.  I will also start him on some prednisone.  I will  repeat the chest x-ray to make sure there is nothing worsening though his lungs are overall very clear and his consistent presentation with COVID-19 is close to the norm for what we see.  He does not appear to be hypoxic either at rest or with ambulation.  The patient has a persistent infiltrate at the base of the lung on the right,  this is consistent with his hypoxia and tachypnea.  At this time the patient will be admitted to the hospital for Covid pneumonia  D/w Dr. Lorin Mercy, who will admit  YAZIEL KEDING was evaluated in Emergency Department on 11/06/2019 for the symptoms described in the history of present illness. He was evaluated in the context of the global COVID-19 pandemic, which necessitated consideration that the patient might be at risk for infection with the SARS-CoV-2 virus that causes COVID-19. Institutional protocols and algorithms that pertain to the evaluation of patients at risk for COVID-19 are in a state of rapid change based on information released by regulatory bodies including the CDC and federal and state organizations. These policies and algorithms were followed during the patient's care in the ED.   Final Clinical Impression(s) / ED Diagnoses Final diagnoses:  Pneumonia due to COVID-19 virus    Rx / DC Orders ED Discharge Orders    None       Noemi Chapel, MD 11/06/19 1536

## 2019-11-06 NOTE — ED Triage Notes (Signed)
Patient complains of being diagnosed with covid 2-3 weeks ago and here for ongoing dry cough. Patient complains of pain with cough and describes as worse at night. Denies fever, alert and oriented

## 2019-11-06 NOTE — H&P (Signed)
History and Physical    C[facility] [id number] DOB: [birth year] DOA: 11/06/2019  PCP: Jani Gravel, MD Consultants:  Einar Gip - cardiology; Marlou Sa - orthopedics Patient coming from:  Home - lives with girlfriend; NOK: Girlfriend, Johnnette Barrios, [phone]  Chief Complaint: cough  HPI: [facility] is a 79 y.o. male with medical history significant of RLS; HTN; HLD; chronic back pain; and CAD s/p complex intervention to the LAD with DES in 10/19 presenting with worsening COVID-19 symptoms.  He received his initial COVID-19 vaccine on 2/26 but was infected around that time.  He reports ongoing coughing.  His wife also had symptoms but she seems to be improving.  No fevers.  +loss of smell and taste.  +SOB with ambulation, conversational dyspnea with repetitive coughing.  He was seen at [facility] yesterday and the reported history was somewhat different.  At that time, he reported 1 week of fever, chills, body aches and cough after exposure to his "girlfriend who had COVID".  Rapid COVID positive.  CXR with R basilar opacity suggestive of PNA.  Treated with Augmentin and Zithromax.  I spoke with his girlfriend.  His O2 sats were in the 80s last night but he refused to come.  His girlfriend had COVID and is on day 14 of illness, diagnosed on March 3.  He started with symptoms on 3/5.  He is due for his 2nd dose on March 23 - but his quarantine period will end April 3.   ED Course:  Diagnosed with COVID yesterday.  CXR with hazy opacity, given Augmentin and Azithro.  Worsening symptoms today, infiltrate still present.  Needs admission for COVID-19 PNA.  Review of Systems: As per HPI; otherwise review of systems reviewed and negative.   Ambulatory Status:  Ambulates with a cane  Past Medical History:  Diagnosis Date  . Arthritis    "a little all over my body" (06/01/2018)  . Chronic lower back pain   . GERD (gastroesophageal reflux disease)   . History of BPH   . Hyperlipidemia   . Hypertension   . RLS  (restless legs syndrome)   . Ventral hernia   . Vertigo     Past Surgical History:  Procedure Laterality Date  . BACK SURGERY    . CORONARY ANGIOPLASTY WITH STENT PLACEMENT  06/01/2018  . CORONARY STENT INTERVENTION N/A 06/01/2018   Procedure: CORONARY STENT INTERVENTION;  Surgeon: Adrian Prows, MD;  Location: [place] [facility];  Service: Cardiovascular;  Laterality: N/A;  . HAMMER TOE SURGERY Left 2012  . JOINT REPLACEMENT    . LEFT HEART CATH AND CORONARY ANGIOGRAPHY N/A 06/01/2018   Procedure: LEFT HEART CATH AND CORONARY ANGIOGRAPHY;  Surgeon: Adrian Prows, MD;  Location: [place] [facility];  Service: Cardiovascular;  Laterality: N/A;  . LUMBAR [place]     "I had 2 other lower back ORs before the one in 2015"  . LUMBAR LAMINECTOMY/DECOMPRESSION MICRODISCECTOMY N/A 05/29/2014   Procedure: CENTRAL LUMBAR LAMINECTOMY L3-4 and L4-5;  Surgeon: Jessy Oto, MD;  Location: [place];  Service: Orthopedics;  Laterality: N/A;  . TOTAL KNEE ARTHROPLASTY Left 03/31/2017   Procedure: LEFT TOTAL KNEE ARTHROPLASTY;  Surgeon: Meredith Pel, MD;  Location: [place];  Service: Orthopedics;  Laterality: Left;  . TOTAL KNEE ARTHROPLASTY Right 05/24/2019   Procedure: RIGHT TOTAL KNEE ARTHROPLASTY;  Surgeon: Meredith Pel, MD;  Location: [place];  Service: Orthopedics;  Laterality: Right;    Social History   Socioeconomic History  .  Marital status: Significant Other    Spouse name: Not on file  . Number of children: 0  . Years of education: 8  . 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 Smoking status: Never Smoker  . Smokeless tobacco: Never Used  Substance and Sexual Activity  . Alcohol use: Not Currently    Comment: 06/01/2018  "probably have 3 beers/month"  . Drug use: Never  . Sexual activity: Not Currently  Other Topics Concern  . Not on file  Social History Narrative   Lives with sig other    Caffeine use- coffee all day   Social Determinants of Health   Financial Resource Strain:   . Difficulty of Paying Living Expenses:   Food Insecurity:   . Worried About Charity fundraiser in the Last Year:   . Arboriculturist in the Last Year:   Transportation Needs:   . Film/video editor (Medical):   [name] Lack of Transportation (Non-Medical):   Physical Activity:   . Days of Exercise per Week:   . Minutes of Exercise per Session:   Stress:   . Feeling of Stress :   Social Connections:   . Frequency of Communication with Friends and Family:   . Frequency of Social Gatherings with Friends and Family:   . Attends Religious Services:   . Active Member of Clubs or Organizations:   . Attends Archivist Meetings:   [name] Marital Status:   Intimate Partner Violence:   . Fear of Current or Ex-Partner:   . Emotionally Abused:   [name] Physically Abused:   . Sexually Abused:     No Known Allergies  Family History  Problem Relation Age of Onset  . Osteoarthritis Mother     Prior to Admission medications   Medication Sig Start Date End Date Taking? Authorizing Provider  amLODipine (NORVASC) 5 MG tablet Take 1 tablet (5 mg total) by mouth daily. 07/19/19 10/19/19  Miquel Dunn, NP  aspirin EC 81 MG tablet Take 81 mg by mouth daily.    [provider]  atorvastatin (LIPITOR) 20 MG tablet Take 1 tablet (20 mg total) by mouth daily. 07/19/19   Miquel Dunn, NP  clopidogrel (PLAVIX) 75 MG tablet Take 1 tablet (75 mg total) by mouth daily with breakfast. 11/30/18   Adrian Prows, MD  isosorbide mononitrate (IMDUR) 30 MG 24 hr tablet Take 1 tablet (30 mg total) by mouth daily. 07/20/19 10/19/19  Miquel Dunn, NP  meclizine (ANTIVERT) 25 MG tablet Take 25 mg by mouth 3 (three) times daily as needed for dizziness.    [provider]  metoprolol succinate (TOPROL-XL) 25 MG 24 hr tablet TAKE 1 TABLET(25 MG) BY MOUTH DAILY 10/21/19   Miquel Dunn, NP  nitroGLYCERIN (NITROSTAT) 0.4 MG SL tablet Place 0.4 mg under the tongue every 5 (five) minutes as needed for chest pain.    [provider]  oxyCODONE (OXY IR/ROXICODONE) 5 MG immediate release tablet Take 1 tablet (5 mg total) by mouth daily as needed for moderate pain (pain score 4-6). 07/08/19   Magnant, Charles L, PA-C  pantoprazole (PROTONIX) 40 MG tablet TAKE 1 TABLET(40 MG) BY MOUTH DAILY Patient taking differently: Take 40 mg by mouth daily.  09/14/19   Adrian Prows, MD  pramipexole (MIRAPEX) 0.5 MG tablet TAKE 1 TABLET BY MOUTH EVERY NIGHT AT BEDTIME FOR RESTLESS LEG.  Patient taking differently: Take 0.5 mg by mouth at bedtime.  10/18/19   Magnant, Charles L, PA-C  terazosin (HYTRIN) 5 MG capsule Take 5 mg by mouth at bedtime.  05/11/17   [provider]  tiZANidine (ZANAFLEX) 2 MG tablet Take 1 tablet (2 mg total) by mouth every 8 (eight) hours as needed. 06/08/19   Magnant, Charles L, PA-C  valsartan-hydrochlorothiazide (DIOVAN HCT) 160-12.5 MG tablet Take 1 tablet by mouth daily. 08/02/19   Miquel Dunn, NP    Physical Exam: Vitals:   11/06/19 1422 11/06/19 1500  BP: 133/74 (!) 148/69  Pulse: 76 73  Resp: (!) 32 (!) 30  Temp: 98.7 F (37.1 C)   TempSrc: Oral   SpO2: 96% 96%     . General:  Appears mildly ill but is in NAD . Eyes:  PERRL, EOMI, normal lids, iris . ENT:  grossly normal hearing, lips & tongue, mmm; edentulous . Neck:  no LAD, masses or thyromegaly . Cardiovascular:  RRR, no m/r/g. Chronic and stable RLE edema.  [name] Respiratory:   CTA bilaterally with no wheezes/rales/rhonchi.  Increased respiratory effort.  No hypoxia at this time.  Repetitive nonproductive cough. . Abdomen:  soft, NT, ND, NABS . Skin:  no rash or induration seen on limited exam . Musculoskeletal:  grossly normal tone BUE/BLE, good ROM, no bony abnormality . Psychiatric:  blunted mood and affect, speech fluent and appropriate, AOx3 . Neurologic:  CN 2-12  grossly intact, moves all extremities in coordinated fashion    Radiological Exams on Admission: DG Chest Port 1 View  Result Date: 11/06/2019 CLINICAL DATA:  Cough. COVID positive. EXAM: PORTABLE CHEST 1 VIEW COMPARISON:  03/20/2017 chest radiograph FINDINGS: UPPER limits normal heart size noted. Patchy RIGHT LOWER lung opacity noted likely representing infection/pneumonia. No pulmonary mass, pleural effusion or pneumothorax. No acute bony abnormalities are present. IMPRESSION: Patchy RIGHT LOWER lung opacity likely representing infection/pneumonia. Electronically Signed   By: Margarette Canada M.D.   On: 11/06/2019 14:26    EKG: not done   Labs on Admission: I have personally reviewed the available labs and imaging studies at the time of the admission.  Pertinent labs:   CO2 20 Glucose 107 WBC 4.0 Platelets 94 LDH 336 Lactate 1.3 Procalcitonin <0.10 D-dimer 2.63 Fibrinogen 499   Assessment/Plan Active Problems:   * No active hospital problems. *   Acute respiratory disease due to COVID-19 infection -Patient with presenting with SOB and recurrent cough -He tested positive at [facility] yesterday (3/13) and was given Augmentin and Azithromycin -Loss of smell and taste noted without the presence of other GI symptoms -He does not have a usual home O2 requirement and is currently not requiring [place] O2, but he was as low as the 80s last night and 91% here and so O2 may be needed -COVID POSITIVE -The patient has comorbidities which may increase the risk for ARDS/MODS including: age, HTN -Pertinent labs concerning for COVID include normal WBC count;  increased LDH; markedly elevated D-dimer (>1); increased ferritin; low procalcitonin; elevated CRP (not >7); increased fibrinogen -CXR with patchy RLL opacities which may be c/w COVID PNA -Will not treat with broad-spectrum antibiotics given procalcitonin <0.1 -Will admit for further evaluation, close monitoring, and treatment -Monitor on telemetry  x at least 24 hours -At this time, will attempt to avoid use of aerosolized medications and use HFAs instead -Will check daily labs including BMP with Mag, Phos; LFTs; CBC with differential; CRP; ferritin; fibrinogen; D-dimer -Will order steroids and  Remdesivir (pharmacy consult) given +COVID test, +CXR, and hypoxia <94% on room air -If the patient shows clinical deterioration, consider transfer to ICU with PCCM consultation -Consider Tocilizumab and/or convalescent plasma if the patient does not stabilize on current treatment or if the patient has marked clinical decompensation; the patient does not appear to require these treatments at this time. -Will attempt to maintain euvolemia to a net negative fluid status -Will ask the patient to maintain an awake prone position for 16+ hours a day, if possible, with a minimum of 2-3 hours at a time -With D-dimer <5, will use standard-dosed Lovenox for DVT prevention -Patient was seen wearing full PPE including: gown, gloves, head cover, N95, and face shield; donning and doffing was in compliance with current standards.  CAD -s/p complex intervention to the LAD with DES  -Continue ASA, Plavix, Imdur  HTN -Continue Norvasc, Hytrin  HLD -Continue Lipitor  Restless legs -Continue Mirapex    DVT prophylaxis:  Lovenox  Code Status:  Full - confirmed with patient Family Communication: None present; I spoke with the patient's girlfriend by telephone. Disposition Plan:  He is anticipated to d/c to home without [facility] services once her respiratory issues have been resolved.  He may require home O2 at the time of discharge. Consults called: None  Admission status: Admit - It is my clinical opinion that admission to INPATIENT is reasonable and necessary because of the expectation that this patient will require hospital care that crosses at least 2 midnights to treat this condition based on the medical complexity of the problems presented.  Given the  aforementioned information, the predictability of an adverse outcome is felt to be significant.     Karmen Bongo MD [facility]   How to contact the [facility] Attending or Consulting provider [place] or covering provider during after hours [place], for this patient?  1. Check the care team in [facility] and look for a) attending/consulting TRH provider listed and b) the [facility] team listed 2. Log into [email or web address] and use [place]'s universal password to access. If you do not have the password, please contact the hospital operator. 3. Locate the [facility] provider you are looking for under [facility] and page to a number that you can be directly reached. 4. If you still have difficulty reaching the provider, please page the [facility] (Director on Call) for the Hospitalists listed on amion for assistance.   11/06/2019, 3:26 PM

## 2019-11-07 ENCOUNTER — Other Ambulatory Visit: Payer: Self-pay

## 2019-11-07 LAB — CBC WITH DIFFERENTIAL/PLATELET
Abs Immature Granulocytes: 0.01 10*3/uL (ref 0.00–0.07)
Basophils Absolute: 0 10*3/uL (ref 0.0–0.1)
Basophils Relative: 0 %
Eosinophils Absolute: 0 10*3/uL (ref 0.0–0.5)
Eosinophils Relative: 0 %
HCT: 38.4 % — ABNORMAL LOW (ref 39.0–52.0)
Hemoglobin: 13 g/dL (ref 13.0–17.0)
Immature Granulocytes: 0 %
Lymphocytes Relative: 17 %
Lymphs Abs: 0.4 10*3/uL — ABNORMAL LOW (ref 0.7–4.0)
MCH: 27.1 pg (ref 26.0–34.0)
MCHC: 33.9 g/dL (ref 30.0–36.0)
MCV: 80 fL (ref 80.0–100.0)
Monocytes Absolute: 0.1 10*3/uL (ref 0.1–1.0)
Monocytes Relative: 4 %
Neutro Abs: 1.8 10*3/uL (ref 1.7–7.7)
Neutrophils Relative %: 79 %
Platelets: 87 10*3/uL — ABNORMAL LOW (ref 150–400)
RBC: 4.8 MIL/uL (ref 4.22–5.81)
RDW: 15.3 % (ref 11.5–15.5)
WBC: 2.4 10*3/uL — ABNORMAL LOW (ref 4.0–10.5)
nRBC: 0 % (ref 0.0–0.2)

## 2019-11-07 LAB — FERRITIN: Ferritin: 294 ng/mL (ref 24–336)

## 2019-11-07 LAB — COMPREHENSIVE METABOLIC PANEL
ALT: 38 U/L (ref 0–44)
AST: 61 U/L — ABNORMAL HIGH (ref 15–41)
Albumin: 3 g/dL — ABNORMAL LOW (ref 3.5–5.0)
Alkaline Phosphatase: 56 U/L (ref 38–126)
Anion gap: 7 (ref 5–15)
BUN: 21 mg/dL (ref 8–23)
CO2: 21 mmol/L — ABNORMAL LOW (ref 22–32)
Calcium: 8 mg/dL — ABNORMAL LOW (ref 8.9–10.3)
Chloride: 106 mmol/L (ref 98–111)
Creatinine, Ser: 0.98 mg/dL (ref 0.61–1.24)
GFR calc Af Amer: >60 mL/min (ref >60)
GFR calc non Af Amer: >60 mL/min (ref >60)
Glucose, Bld: 185 mg/dL — ABNORMAL HIGH (ref 70–99)
Potassium: 3.5 mmol/L (ref 3.5–5.1)
Sodium: 134 mmol/L — ABNORMAL LOW (ref 135–145)
Total Bilirubin: 0.8 mg/dL (ref 0.3–1.2)
Total Protein: 5.6 g/dL — ABNORMAL LOW (ref 6.5–8.1)

## 2019-11-07 LAB — MAGNESIUM: Magnesium: 1.9 mg/dL (ref 1.7–2.4)

## 2019-11-07 LAB — C-REACTIVE PROTEIN: CRP: 3.1 mg/dL — ABNORMAL HIGH (ref <1.0)

## 2019-11-07 LAB — PHOSPHORUS: Phosphorus: 2.6 mg/dL (ref 2.5–4.6)

## 2019-11-07 LAB — D-DIMER, QUANTITATIVE: D-Dimer, Quant: 1.63 ug/mL-FEU — ABNORMAL HIGH (ref 0.00–0.50)

## 2019-11-07 MED ORDER — SIMETHICONE 80 MG PO CHEW
80.0000 mg | CHEWABLE_TABLET | Freq: Four times a day (QID) | ORAL | Status: DC | PRN
  Administered 2019-11-07 (×2): 80 mg via ORAL
  Filled 2019-11-07 (×2): qty 1

## 2019-11-07 MED ORDER — ENSURE ENLIVE PO LIQD
237.0000 mL | Freq: Two times a day (BID) | ORAL | Status: DC
  Administered 2019-11-07 – 2019-11-11 (×9): 237 mL via ORAL

## 2019-11-07 MED ORDER — POLYETHYLENE GLYCOL 3350 17 G PO PACK
34.0000 g | PACK | Freq: Once | ORAL | Status: AC
  Administered 2019-11-07: 34 g via ORAL
  Filled 2019-11-07: qty 2

## 2019-11-07 MED ORDER — SENNOSIDES-DOCUSATE SODIUM 8.6-50 MG PO TABS
3.0000 | ORAL_TABLET | Freq: Every day | ORAL | Status: DC
  Filled 2019-11-07 (×4): qty 3

## 2019-11-07 NOTE — Progress Notes (Signed)
PROGRESS NOTE                                                                                                                                                                                                             Patient Demographics:    Cody Floyd, is a 79 y.o. male, DOB - 08-Feb-1941, IN:[id number]  Admit date - 11/06/2019   Admitting Physician Karmen Bongo, MD  Outpatient Primary MD for the patient is Jani Gravel, MD  LOS - 1   No chief complaint on file.      Brief Narrative    Cody Floyd is a 79 y.o. male with medical history significant of RLS; HTN; HLD; chronic back pain; and CAD s/p complex intervention to the LAD with DES in 10/19 presenting with worsening COVID-19 symptoms.  He received his initial COVID-19 vaccine on 2/26 but was infected around that time.  He reports ongoing coughing.  His wife also had symptoms but she seems to be improving.  No fevers.  +loss of smell and taste.  +SOB with ambulation, conversational dyspnea with repetitive coughing.  He was seen at [facility] yesterday and the reported history was somewhat different.  At that time, he reported 1 week of fever, chills, body aches and cough after exposure to his "girlfriend who had COVID".  Rapid COVID positive.  CXR with R basilar opacity suggestive of PNA.  Treated with Augmentin and Zithromax.  I spoke with his girlfriend.  His O2 sats were in the 80s last night but he refused to come.  His girlfriend had COVID and is on day 14 of illness, diagnosed on March 3.  He started with symptoms on 3/5.  He is due for his 2nd dose on March 23 - but his quarantine period will end April 3.   ED Course:  Diagnosed with COVID yesterday.  CXR with hazy opacity, given Augmentin and Azithro.  Worsening symptoms today, infiltrate still present.  Needs admission for COVID-19 PNA.   Subjective:    Cody Floyd today ports generalized weakness, poor appetite, constipation, shortness of  breath and cough.  .   Assessment  & Plan :    Principal Problem:   Acute respiratory disease due to COVID-19 virus Active Problems:   Spinal stenosis, lumbar region, with neurogenic claudication   Peripheral arterial disease ([place])  Restless legs syndrome   Essential hypertension   Dyslipidemia   CAD (coronary artery disease), native coronary artery   Acute hypoxic respiratory failure due to COVID-19 pneumonia -Patient remains hypoxic today, he is on 2 L nasal cannula. -He was encouraged use incentive spirometry, and to use flutter valve, and to get out of bed to chair daytime. -Trend inflammatory markers. -Continue with IV steroids. -Continue with IV remdesivir. -So far no indication for Actemra.    COVID-19 Labs  Recent Labs    11/06/19 1429 11/06/19 1638 11/07/19 0250  DDIMER 2.63*  --  1.63*  FERRITIN  --  363* 294  LDH 336*  --   --   CRP  --  3.0* 3.1*    Lab Results  Component Value Date   SARSCOV2NAA NOT DETECTED 05/20/2019    CAD -s/p complex intervention to the LAD with DES  -Continue ASA, Plavix, Imdur  HTN -Continue Norvasc, Hytrin  HLD -Continue Lipitor  Restless legs -Continue Mirapex  Code Status : Full  Family Communication  : Discussed with significant other(they have been together for more than 25 years)  Disposition Plan  : Home  Barriers For Discharge : Home once stable, remains hypoxic on oxygen, on IV steroids and remdesivir  Consults  : None  Procedures  : None  DVT Prophylaxis  :  [place] lovenox  Lab Results  Component Value Date   PLT 87 (L) 11/07/2019    Antibiotics  :    Anti-infectives (From admission, onward)   Start     Dose/Rate Route Frequency Ordered Stop   11/07/19 1000  remdesivir 100 mg in sodium chloride 0.9 % 100 mL IVPB  Status:  Discontinued     100 mg 200 mL/hr over 30 Minutes Intravenous Daily 11/06/19 1545 11/06/19 1556   11/07/19 1000  remdesivir 100 mg in sodium chloride 0.9 % 100 mL IVPB      100 mg 200 mL/hr over 30 Minutes Intravenous Daily 11/06/19 1558 11/11/19 0959   11/06/19 1630  remdesivir 100 mg in sodium chloride 0.9 % 100 mL IVPB     100 mg 200 mL/hr over 30 Minutes Intravenous  Once 11/06/19 1558 11/07/19 0208   11/06/19 1600  remdesivir 100 mg in sodium chloride 0.9 % 100 mL IVPB     100 mg 200 mL/hr over 30 Minutes Intravenous  Once 11/06/19 1558 11/06/19 1910   11/06/19 1545  remdesivir 200 mg in sodium chloride 0.9% 250 mL IVPB  Status:  Discontinued     200 mg 580 mL/hr over 30 Minutes Intravenous Once 11/06/19 1545 11/06/19 1556   11/06/19 1515  cefTRIAXone (ROCEPHIN) 1 g in sodium chloride 0.9 % 100 mL IVPB     1 g 200 mL/hr over 30 Minutes Intravenous  Once 11/06/19 1510 11/06/19 1636   11/06/19 1515  azithromycin (ZITHROMAX) 500 mg in sodium chloride 0.9 % 250 mL IVPB     500 mg 250 mL/hr over 60 Minutes Intravenous  Once 11/06/19 1510 11/06/19 1800        Objective:   Vitals:   11/06/19 1716 11/06/19 2211 11/07/19 0400 11/07/19 1100  BP: 130/65 136/75 (!) 135/57 (!) 120/55  Pulse: 98  60 80  Resp: 18  20 (!) 23  Temp: 98.8 F (37.1 C) 98.2 F (36.8 C) 98 F (36.7 C) 98.2 F (36.8 C)  TempSrc: Oral Oral Oral Axillary  SpO2: 93%  92% 94%  Weight: 101.4 kg     Height: 6\' 1"  (1.854  m)       Wt Readings from Last 3 Encounters:  11/06/19 101.4 kg  10/19/19 111.1 kg  08/29/19 110.7 kg     Intake/Output Summary (Last 24 hours) at 11/07/2019 1423 Last data filed at 11/07/2019 0916 Gross per 24 hour  Intake 1614.38 ml  Output 650 ml  Net 964.38 ml     Physical Exam  Awake Alert, Oriented X 3, No new F.N deficits, Normal affect Symmetrical Chest wall movement, Good air movement bilaterally, CTAB RRR,No Gallops,Rubs or new Murmurs, No Parasternal Heave +ve B.Sounds, Abd Soft, No tenderness,  No rebound - guarding or rigidity. No Cyanosis, Clubbing or edema, No new Rash or bruise      Data Review:    CBC Recent Labs  Lab  11/06/19 1327 11/07/19 0250  WBC 4.0 2.4*  HGB 14.5 13.0  HCT 43.5 38.4*  PLT 94* 87*  MCV 80.9 80.0  MCH 27.0 27.1  MCHC 33.3 33.9  RDW 15.6* 15.3  LYMPHSABS 0.9 0.4*  MONOABS 0.2 0.1  EOSABS 0.0 0.0  BASOSABS 0.0 0.0    Chemistries  Recent Labs  Lab 11/06/19 1327 11/07/19 0250  NA 134* 134*  K 3.9 3.5  CL 103 106  CO2 20* 21*  GLUCOSE 107* 185*  BUN 20 21  CREATININE 1.09 0.98  CALCIUM 8.2* 8.0*  MG  --  1.9  AST  --  61*  ALT  --  38  ALKPHOS  --  56  BILITOT  --  0.8   ------------------------------------------------------------------------------------------------------------------ No results for input(s): CHOL, HDL, LDLCALC, TRIG, CHOLHDL, LDLDIRECT in the last 72 hours.  Lab Results  Component Value Date   HGBA1C 5.6 07/30/2018   ------------------------------------------------------------------------------------------------------------------ No results for input(s): TSH, T4TOTAL, T3FREE, THYROIDAB in the last 72 hours.  Invalid input(s): FREET3 ------------------------------------------------------------------------------------------------------------------ Recent Labs    11/06/19 1638 11/07/19 0250  FERRITIN 363* 294    Coagulation profile No results for input(s): INR, PROTIME in the last 168 hours.  Recent Labs    11/06/19 1429 11/07/19 0250  DDIMER 2.63* 1.63*    Cardiac Enzymes No results for input(s): CKMB, TROPONINI, MYOGLOBIN in the last 168 hours.  Invalid input(s): CK ------------------------------------------------------------------------------------------------------------------ No results found for: BNP  Inpatient Medications  Scheduled Meds: . amLODipine  5 mg Oral Daily  . aspirin EC  81 mg Oral Daily  . atorvastatin  20 mg Oral Daily  . clopidogrel  75 mg Oral Q breakfast  . enoxaparin (LOVENOX) injection  40 mg Subcutaneous Q24H  . feeding supplement (ENSURE ENLIVE)  237 mL Oral BID BM  . isosorbide mononitrate   30 mg Oral Daily  . methylPREDNISolone (SOLU-MEDROL) injection  50 mg Intravenous Q12H  . pantoprazole  40 mg Oral Daily  . pramipexole  0.5 mg Oral QHS  . senna-docusate  3 tablet Oral QHS  . sodium chloride flush  3 mL Intravenous Q12H  . terazosin  5 mg Oral QHS   Continuous Infusions: . sodium chloride    . remdesivir 100 mg in NS 100 mL Stopped (11/07/19 0916)   PRN Meds:.sodium chloride, acetaminophen, albuterol, benzonatate, bisacodyl, chlorpheniramine-HYDROcodone, guaiFENesin-dextromethorphan, ondansetron **OR** ondansetron (ZOFRAN) IV, oxyCODONE, polyethylene glycol, simethicone, sodium chloride flush, sodium phosphate  Micro Results No results found for this or any previous visit (from the past 240 hour(s)).  Radiology Reports DG Chest Port 1 View  Result Date: 11/06/2019 CLINICAL DATA:  Cough. COVID positive. EXAM: PORTABLE CHEST 1 VIEW COMPARISON:  03/20/2017 chest radiograph FINDINGS: UPPER limits normal heart  size noted. Patchy RIGHT LOWER lung opacity noted likely representing infection/pneumonia. No pulmonary mass, pleural effusion or pneumothorax. No acute bony abnormalities are present. IMPRESSION: Patchy RIGHT LOWER lung opacity likely representing infection/pneumonia. Electronically Signed   By: Margarette Canada M.D.   On: 11/06/2019 14:26     Phillips Climes M.D on 11/07/2019 at 2:23 PM  Between 7am to 7pm - Pager - [phone]  After 7pm go to [email or web address] - password [facility]  [facility] -  Office  [phone]

## 2019-11-07 NOTE — Plan of Care (Signed)

## 2019-11-07 NOTE — Progress Notes (Signed)
Initial Nutrition Assessment   RD working remotely.  DOCUMENTATION CODES:   Not applicable  INTERVENTION:  Downgrade diet to dysphagia 3 diet as pt reports no dentures in place.   Continue Ensure Enlive po BID, each supplement provides 350 kcal and 20 grams of protein.  Encourage adequate PO intake.   NUTRITION DIAGNOSIS:   Increased nutrient needs related to acute illness(COVID) as evidenced by estimated needs.  GOAL:   Patient will meet greater than or equal to 90% of their needs  MONITOR:   PO intake, Supplement acceptance, Skin, Weight trends, Labs, I & O's  REASON FOR ASSESSMENT:   Malnutrition Screening Tool    ASSESSMENT:   79 y.o. male with medical history significant of RLS; HTN; HLD; chronic back pain; and CAD s/p complex intervention to the LAD with DES in 10/19 presenting with worsening COVID-19 symptoms.  Meal completion 100%. Pt reports having a decreased appetite over the past 2-3 weeks. Pt reports trying to eat at meals, however unable to taste the food or fluids. Pt reports no dentures in place and he is unable to chew hard foods. RD to downgrade diet to dysphagia 3. Usual body weight reported to be ~244 lbs which he reported last weighing the other day when he checked in at urgent care. Question accuracy of weight recorded upon current admission. Pt currently has Ensure ordered and has been consuming them. RD to continue with current nutritional supplements to aid in caloric and protein needs.   Unable to complete Nutrition-Focused physical exam at this time.   Labs and medications reviewed.   Diet Order:   Diet Order            Diet Heart Room service appropriate? Yes; Fluid consistency: Thin  Diet effective now              EDUCATION NEEDS:   Not appropriate for education at this time  Skin:  Skin Assessment: Reviewed RN Assessment  Last BM:  3/13  Height:   Ht Readings from Last 1 Encounters:  11/06/19 6\' 1"  (1.854 m)    Weight:    Wt Readings from Last 1 Encounters:  11/06/19 101.4 kg    BMI:  Body mass index is 29.49 kg/m.  Estimated Nutritional Needs:   Kcal:  2100-2300  Protein:  105-115 grams  Fluid:  >/= 2 L/day    Corrin Parker, MS, RD, LDN RD pager number/after hours weekend pager number on Amion.

## 2019-11-08 LAB — CBC WITH DIFFERENTIAL/PLATELET
Abs Immature Granulocytes: 0.02 10*3/uL (ref 0.00–0.07)
Basophils Absolute: 0 10*3/uL (ref 0.0–0.1)
Basophils Relative: 0 %
Eosinophils Absolute: 0 10*3/uL (ref 0.0–0.5)
Eosinophils Relative: 0 %
HCT: 40.5 % (ref 39.0–52.0)
Hemoglobin: 13.5 g/dL (ref 13.0–17.0)
Immature Granulocytes: 0 %
Lymphocytes Relative: 10 %
Lymphs Abs: 0.8 10*3/uL (ref 0.7–4.0)
MCH: 27 pg (ref 26.0–34.0)
MCHC: 33.3 g/dL (ref 30.0–36.0)
MCV: 81 fL (ref 80.0–100.0)
Monocytes Absolute: 0.3 10*3/uL (ref 0.1–1.0)
Monocytes Relative: 4 %
Neutro Abs: 6.6 10*3/uL (ref 1.7–7.7)
Neutrophils Relative %: 86 %
Platelets: 120 10*3/uL — ABNORMAL LOW (ref 150–400)
RBC: 5 MIL/uL (ref 4.22–5.81)
RDW: 15.2 % (ref 11.5–15.5)
WBC: 7.7 10*3/uL (ref 4.0–10.5)
nRBC: 0 % (ref 0.0–0.2)

## 2019-11-08 LAB — MAGNESIUM: Magnesium: 2 mg/dL (ref 1.7–2.4)

## 2019-11-08 LAB — C-REACTIVE PROTEIN: CRP: 1.8 mg/dL — ABNORMAL HIGH (ref <1.0)

## 2019-11-08 LAB — COMPREHENSIVE METABOLIC PANEL
ALT: 39 U/L (ref 0–44)
AST: 59 U/L — ABNORMAL HIGH (ref 15–41)
Albumin: 3.2 g/dL — ABNORMAL LOW (ref 3.5–5.0)
Alkaline Phosphatase: 48 U/L (ref 38–126)
Anion gap: 10 (ref 5–15)
BUN: 26 mg/dL — ABNORMAL HIGH (ref 8–23)
CO2: 21 mmol/L — ABNORMAL LOW (ref 22–32)
Calcium: 8.6 mg/dL — ABNORMAL LOW (ref 8.9–10.3)
Chloride: 103 mmol/L (ref 98–111)
Creatinine, Ser: 1.1 mg/dL (ref 0.61–1.24)
GFR calc Af Amer: >60 mL/min (ref >60)
GFR calc non Af Amer: >60 mL/min (ref >60)
Glucose, Bld: 165 mg/dL — ABNORMAL HIGH (ref 70–99)
Potassium: 3.9 mmol/L (ref 3.5–5.1)
Sodium: 134 mmol/L — ABNORMAL LOW (ref 135–145)
Total Bilirubin: 0.6 mg/dL (ref 0.3–1.2)
Total Protein: 5.8 g/dL — ABNORMAL LOW (ref 6.5–8.1)

## 2019-11-08 LAB — D-DIMER, QUANTITATIVE: D-Dimer, Quant: 1.26 ug/mL-FEU — ABNORMAL HIGH (ref 0.00–0.50)

## 2019-11-08 LAB — FERRITIN: Ferritin: 330 ng/mL (ref 24–336)

## 2019-11-08 LAB — PHOSPHORUS: Phosphorus: 2.6 mg/dL (ref 2.5–4.6)

## 2019-11-08 NOTE — Progress Notes (Addendum)
Patient ambulated in his room during this shift with oxygen 2L, via [place]. He was complaining of feeling weak and having SOB. Patient was encouraged to use IS and flutter valve. Will continue to monitor.

## 2019-11-08 NOTE — Progress Notes (Signed)
PROGRESS NOTE                                                                                                                                                                                                             Patient Demographics:    Cody Floyd, is a 79 y.o. male, DOB - May 02, 1941, [id number]  Admit date - 11/06/2019   Admitting Physician Karmen Bongo, MD  Outpatient Primary MD for the patient is Jani Gravel, MD  LOS - 2   No chief complaint on file.      Brief Narrative    Cody Floyd is a 79 y.o. male with medical history significant of RLS; HTN; HLD; chronic back pain; and CAD s/p complex intervention to the LAD with DES in 10/19 presenting with worsening COVID-19 symptoms.  He received his initial COVID-19 vaccine on 2/26 but was infected around that time.  He reports ongoing coughing.  Shortness of breath, loss of smell and taste, patient with new oxygen requirement, admitted for COVID-19 pneumonia .    Subjective:    Cody Floyd today ports generalized weakness, poor appetite, constipation, shortness of breath and cough.  .   Assessment  & Plan :    Principal Problem:   Acute respiratory disease due to COVID-19 virus Active Problems:   Spinal stenosis, lumbar region, with neurogenic claudication   Peripheral arterial disease ([facility])   Restless legs syndrome   Essential hypertension   Dyslipidemia   CAD (coronary artery disease), native coronary artery   Acute hypoxic respiratory failure due to COVID-19 pneumonia -Patient remains hypoxic today, he is on 2 L nasal cannula. -He was encouraged use incentive spirometry, and to use flutter valve, and to get out of bed to chair daytime. -Trend inflammatory markers. -Continue with IV steroids. -Continue with IV remdesivir. -So far no indication for Actemra    COVID-19 Labs  Recent Labs    11/06/19 1429 11/06/19 1638 11/07/19 0250 11/08/19 0119  DDIMER 2.63*  --   1.63* 1.26*  FERRITIN  --  363* 294 330  LDH 336*  --   --   --   CRP  --  3.0* 3.1* 1.8*    Lab Results  Component Value Date   SARSCOV2NAA NOT DETECTED 05/20/2019    CAD -He denies any chest pain. -s/p complex intervention to the  LAD with DES  -Continue ASA, Plavix, Imdur  HTN -Continue Norvasc, Hytrin  HLD -Continue Lipitor  Restless legs -Continue Mirapex  Code Status : Full  Family Communication  : Discussed with significant other on 3/15 (they have been together for more than 25 years)  Disposition Plan  : Home  Barriers For Discharge : Home once stable, remains hypoxic on oxygen, on IV steroids and remdesivir  Consults  : None  Procedures  : None  DVT Prophylaxis  :  [place] lovenox  Lab Results  Component Value Date   PLT 120 (L) 11/08/2019    Antibiotics  :    Anti-infectives (From admission, onward)   Start     Dose/Rate Route Frequency Ordered Stop   11/07/19 1000  remdesivir 100 mg in sodium chloride 0.9 % 100 mL IVPB  Status:  Discontinued     100 mg 200 mL/hr over 30 Minutes Intravenous Daily 11/06/19 1545 11/06/19 1556   11/07/19 1000  remdesivir 100 mg in sodium chloride 0.9 % 100 mL IVPB     100 mg 200 mL/hr over 30 Minutes Intravenous Daily 11/06/19 1558 11/11/19 0959   11/06/19 1630  remdesivir 100 mg in sodium chloride 0.9 % 100 mL IVPB     100 mg 200 mL/hr over 30 Minutes Intravenous  Once 11/06/19 1558 11/07/19 0208   11/06/19 1600  remdesivir 100 mg in sodium chloride 0.9 % 100 mL IVPB     100 mg 200 mL/hr over 30 Minutes Intravenous  Once 11/06/19 1558 11/06/19 1910   11/06/19 1545  remdesivir 200 mg in sodium chloride 0.9% 250 mL IVPB  Status:  Discontinued     200 mg 580 mL/hr over 30 Minutes Intravenous Once 11/06/19 1545 11/06/19 1556   11/06/19 1515  cefTRIAXone (ROCEPHIN) 1 g in sodium chloride 0.9 % 100 mL IVPB     1 g 200 mL/hr over 30 Minutes Intravenous  Once 11/06/19 1510 11/06/19 1636   11/06/19 1515  azithromycin  (ZITHROMAX) 500 mg in sodium chloride 0.9 % 250 mL IVPB     500 mg 250 mL/hr over 60 Minutes Intravenous  Once 11/06/19 1510 11/06/19 1800        Objective:   Vitals:   11/07/19 2148 11/08/19 0532 11/08/19 0744 11/08/19 1200  BP: (!) 119/59 (!) 118/58 122/62 118/65  Pulse: 73 66 60 (!) 58  Resp: (!) 22 19 (!) 21 (!) 21  Temp: 98 F (36.7 C) 98 F (36.7 C) 98.2 F (36.8 C) 97.8 F (36.6 C)  TempSrc: Oral Oral Oral Oral  SpO2: (!) 88% 93% 90% (!) 89%  Weight:      Height:        Wt Readings from Last 3 Encounters:  11/06/19 101.4 kg  10/19/19 111.1 kg  08/29/19 110.7 kg     Intake/Output Summary (Last 24 hours) at 11/08/2019 1347 Last data filed at 11/08/2019 0847 Gross per 24 hour  Intake 880 ml  Output 700 ml  Net 180 ml     Physical Exam  Awake Alert, Oriented X 3, No new F.N deficits, Normal affect Symmetrical Chest wall movement, Good air movement bilaterally, CTAB RRR,No Gallops,Rubs or new Murmurs, No Parasternal Heave +ve B.Sounds, Abd Soft, No tenderness, No rebound - guarding or rigidity. No Cyanosis, Clubbing or edema, No new Rash or bruise       Data Review:    CBC Recent Labs  Lab 11/06/19 1327 11/07/19 0250 11/08/19 0119  WBC 4.0 2.4* 7.7  HGB 14.5 13.0 13.5  HCT 43.5 38.4* 40.5  PLT 94* 87* 120*  MCV 80.9 80.0 81.0  MCH 27.0 27.1 27.0  MCHC 33.3 33.9 33.3  RDW 15.6* 15.3 15.2  LYMPHSABS 0.9 0.4* 0.8  MONOABS 0.2 0.1 0.3  EOSABS 0.0 0.0 0.0  BASOSABS 0.0 0.0 0.0    Chemistries  Recent Labs  Lab 11/06/19 1327 11/07/19 0250 11/08/19 0119  NA 134* 134* 134*  K 3.9 3.5 3.9  CL 103 106 103  CO2 20* 21* 21*  GLUCOSE 107* 185* 165*  BUN 20 21 26*  CREATININE 1.09 0.98 1.10  CALCIUM 8.2* 8.0* 8.6*  MG  --  1.9 2.0  AST  --  61* 59*  ALT  --  38 39  ALKPHOS  --  56 48  BILITOT  --  0.8 0.6   ------------------------------------------------------------------------------------------------------------------ No results for  input(s): CHOL, HDL, LDLCALC, TRIG, CHOLHDL, LDLDIRECT in the last 72 hours.  Lab Results  Component Value Date   HGBA1C 5.6 07/30/2018   ------------------------------------------------------------------------------------------------------------------ No results for input(s): TSH, T4TOTAL, T3FREE, THYROIDAB in the last 72 hours.  Invalid input(s): FREET3 ------------------------------------------------------------------------------------------------------------------ Recent Labs    11/07/19 0250 11/08/19 0119  FERRITIN 294 330    Coagulation profile No results for input(s): INR, PROTIME in the last 168 hours.  Recent Labs    11/07/19 0250 11/08/19 0119  DDIMER 1.63* 1.26*    Cardiac Enzymes No results for input(s): CKMB, TROPONINI, MYOGLOBIN in the last 168 hours.  Invalid input(s): CK ------------------------------------------------------------------------------------------------------------------ No results found for: BNP  Inpatient Medications  Scheduled Meds: . amLODipine  5 mg Oral Daily  . aspirin EC  81 mg Oral Daily  . atorvastatin  20 mg Oral Daily  . clopidogrel  75 mg Oral Q breakfast  . enoxaparin (LOVENOX) injection  40 mg Subcutaneous Q24H  . feeding supplement (ENSURE ENLIVE)  237 mL Oral BID BM  . isosorbide mononitrate  30 mg Oral Daily  . methylPREDNISolone (SOLU-MEDROL) injection  50 mg Intravenous Q12H  . pantoprazole  40 mg Oral Daily  . pramipexole  0.5 mg Oral QHS  . senna-docusate  3 tablet Oral QHS  . sodium chloride flush  3 mL Intravenous Q12H  . terazosin  5 mg Oral QHS   Continuous Infusions: . sodium chloride    . remdesivir 100 mg in NS 100 mL 100 mg (11/08/19 0845)   PRN Meds:.sodium chloride, acetaminophen, albuterol, benzonatate, bisacodyl, chlorpheniramine-HYDROcodone, guaiFENesin-dextromethorphan, ondansetron **OR** ondansetron (ZOFRAN) IV, oxyCODONE, polyethylene glycol, simethicone, sodium chloride flush, sodium  phosphate  Micro Results No results found for this or any previous visit (from the past 240 hour(s)).  Radiology Reports DG Chest Port 1 View  Result Date: 11/06/2019 CLINICAL DATA:  Cough. COVID positive. EXAM: PORTABLE CHEST 1 VIEW COMPARISON:  03/20/2017 chest radiograph FINDINGS: UPPER limits normal heart size noted. Patchy RIGHT LOWER lung opacity noted likely representing infection/pneumonia. No pulmonary mass, pleural effusion or pneumothorax. No acute bony abnormalities are present. IMPRESSION: Patchy RIGHT LOWER lung opacity likely representing infection/pneumonia. Electronically Signed   By: Margarette Canada M.D.   On: 11/06/2019 14:26     Phillips Climes M.D on 11/08/2019 at 1:47 PM  Between 7am to 7pm - Pager - [phone]  After 7pm go to [email or web address] - password [facility]  [facility] -  Office  [phone]

## 2019-11-08 NOTE — Progress Notes (Signed)
Cody Floyd, significant other was called with updates. All questions were answered. Will continue to monitor.

## 2019-11-08 NOTE — Progress Notes (Signed)
SATURATION QUALIFICATIONS: (This note is used to comply with regulatory documentation for home oxygen)  Patient Saturations on Room Air at Rest = 88%  Patient Saturations on Room Air while Ambulating = 85%  Patient Saturations on 2 Liters of oxygen while Ambulating = 90%  Please briefly explain why patient needs home oxygen: 

## 2019-11-09 DIAGNOSIS — J1282 Pneumonia due to coronavirus disease 2019: Secondary | ICD-10-CM

## 2019-11-09 DIAGNOSIS — J069 Acute upper respiratory infection, unspecified: Secondary | ICD-10-CM

## 2019-11-09 DIAGNOSIS — I739 Peripheral vascular disease, unspecified: Secondary | ICD-10-CM

## 2019-11-09 DIAGNOSIS — U071 COVID-19: Principal | ICD-10-CM

## 2019-11-09 DIAGNOSIS — I2510 Atherosclerotic heart disease of native coronary artery without angina pectoris: Secondary | ICD-10-CM

## 2019-11-09 LAB — C-REACTIVE PROTEIN: CRP: 0.7 mg/dL (ref <1.0)

## 2019-11-09 LAB — COMPREHENSIVE METABOLIC PANEL
ALT: 50 U/L — ABNORMAL HIGH (ref 0–44)
AST: 60 U/L — ABNORMAL HIGH (ref 15–41)
Albumin: 3 g/dL — ABNORMAL LOW (ref 3.5–5.0)
Alkaline Phosphatase: 46 U/L (ref 38–126)
Anion gap: 10 (ref 5–15)
BUN: 29 mg/dL — ABNORMAL HIGH (ref 8–23)
CO2: 21 mmol/L — ABNORMAL LOW (ref 22–32)
Calcium: 8.4 mg/dL — ABNORMAL LOW (ref 8.9–10.3)
Chloride: 105 mmol/L (ref 98–111)
Creatinine, Ser: 0.98 mg/dL (ref 0.61–1.24)
GFR calc Af Amer: >60 mL/min (ref >60)
GFR calc non Af Amer: >60 mL/min (ref >60)
Glucose, Bld: 151 mg/dL — ABNORMAL HIGH (ref 70–99)
Potassium: 3.8 mmol/L (ref 3.5–5.1)
Sodium: 136 mmol/L (ref 135–145)
Total Bilirubin: 0.9 mg/dL (ref 0.3–1.2)
Total Protein: 5.4 g/dL — ABNORMAL LOW (ref 6.5–8.1)

## 2019-11-09 LAB — D-DIMER, QUANTITATIVE: D-Dimer, Quant: 0.85 ug/mL-FEU — ABNORMAL HIGH (ref 0.00–0.50)

## 2019-11-09 LAB — CBC WITH DIFFERENTIAL/PLATELET
Abs Immature Granulocytes: 0.07 10*3/uL (ref 0.00–0.07)
Basophils Absolute: 0 10*3/uL (ref 0.0–0.1)
Basophils Relative: 0 %
Eosinophils Absolute: 0 10*3/uL (ref 0.0–0.5)
Eosinophils Relative: 0 %
HCT: 37.6 % — ABNORMAL LOW (ref 39.0–52.0)
Hemoglobin: 12.6 g/dL — ABNORMAL LOW (ref 13.0–17.0)
Immature Granulocytes: 1 %
Lymphocytes Relative: 8 %
Lymphs Abs: 0.7 10*3/uL (ref 0.7–4.0)
MCH: 27.1 pg (ref 26.0–34.0)
MCHC: 33.5 g/dL (ref 30.0–36.0)
MCV: 80.9 fL (ref 80.0–100.0)
Monocytes Absolute: 0.4 10*3/uL (ref 0.1–1.0)
Monocytes Relative: 5 %
Neutro Abs: 6.9 10*3/uL (ref 1.7–7.7)
Neutrophils Relative %: 86 %
Platelets: 122 10*3/uL — ABNORMAL LOW (ref 150–400)
RBC: 4.65 MIL/uL (ref 4.22–5.81)
RDW: 15.3 % (ref 11.5–15.5)
WBC: 8.1 10*3/uL (ref 4.0–10.5)
nRBC: 0 % (ref 0.0–0.2)

## 2019-11-09 LAB — PHOSPHORUS: Phosphorus: 2.8 mg/dL (ref 2.5–4.6)

## 2019-11-09 LAB — MAGNESIUM: Magnesium: 1.9 mg/dL (ref 1.7–2.4)

## 2019-11-09 LAB — FERRITIN: Ferritin: 303 ng/mL (ref 24–336)

## 2019-11-09 MED ORDER — FLUTICASONE PROPIONATE 50 MCG/ACT NA SUSP
2.0000 | Freq: Every day | NASAL | Status: DC
  Administered 2019-11-09 – 2019-11-11 (×3): 2 via NASAL
  Filled 2019-11-09: qty 16

## 2019-11-09 MED ORDER — MAGNESIUM SULFATE 2 GM/50ML IV SOLN
2.0000 g | Freq: Once | INTRAVENOUS | Status: AC
  Administered 2019-11-09: 2 g via INTRAVENOUS
  Filled 2019-11-09: qty 50

## 2019-11-09 MED ORDER — BENZONATATE 100 MG PO CAPS
200.0000 mg | ORAL_CAPSULE | Freq: Three times a day (TID) | ORAL | Status: DC
  Administered 2019-11-09 – 2019-11-11 (×8): 200 mg via ORAL
  Filled 2019-11-09 (×8): qty 2

## 2019-11-09 MED ORDER — OXYMETAZOLINE HCL 0.05 % NA SOLN
1.0000 | Freq: Two times a day (BID) | NASAL | Status: DC | PRN
  Filled 2019-11-09: qty 30

## 2019-11-09 MED ORDER — MENTHOL 3 MG MT LOZG
1.0000 | LOZENGE | OROMUCOSAL | Status: DC | PRN
  Administered 2019-11-09: 3 mg via ORAL
  Filled 2019-11-09: qty 9

## 2019-11-09 MED ORDER — FUROSEMIDE 10 MG/ML IJ SOLN
40.0000 mg | Freq: Once | INTRAMUSCULAR | Status: AC
  Administered 2019-11-09: 40 mg via INTRAVENOUS
  Filled 2019-11-09: qty 4

## 2019-11-09 MED ORDER — METHYLPREDNISOLONE SODIUM SUCC 40 MG IJ SOLR
40.0000 mg | Freq: Two times a day (BID) | INTRAMUSCULAR | Status: DC
  Administered 2019-11-09 – 2019-11-11 (×4): 40 mg via INTRAVENOUS
  Filled 2019-11-09 (×4): qty 1

## 2019-11-09 NOTE — Evaluation (Signed)
Physical Therapy Evaluation Patient Details Name: Cody Floyd MRN: [id number] DOB: Jan 24, 1941 Today's Date: 11/09/2019   History of Present Illness  79 year old male who presented with cough and shortness of breath and found to have acute hypoxic respiratory failure secondary to COVID-19 PNA. He was admitted 11/06/19 and REmdesivir and steroids started. CRP downtrending. PMH: CAD s/p PCI 2019, HTN, HLD  Clinical Impression  Patient unsteady with standing requiring use of RW and contact guard assistance. He describes that he has a standard walker at home. Patient grossly stable on room air during session. He does desat to 87% but quickly recovers. Patient on room air at end of session, nurse notified. Recommend continued PT services, mobility with nursing staff and discharge home with his wife and home PT with use of walker for all mobility. Patient has a standard walker (based on his description) but a RW would allow for improved energy conservation.     Follow Up Recommendations Home health PT;Supervision for mobility/OOB    Equipment Recommendations  Rolling walker with 5" wheels(patient owns standard walker)       Precautions / Restrictions Precautions Precautions: Fall;Other (comment) Precaution Comments: monitor oxygen saturation, dysphagia 3 with thin liquids Restrictions Weight Bearing Restrictions: No      Mobility  Bed Mobility   General bed mobility comments: Patient already OBO in chair  Transfers Overall transfer level: Needs assistance Equipment used: Rolling walker (2 wheeled) Transfers: Sit to/from Stand Sit to Stand: Min guard;Supervision         General transfer comment: sit<>stand from chair without AD with contact guard, sit<>stand with RW x 2 trials contact guard for sit>stand, close supervision for stand>sit  Ambulation/Gait Ambulation/Gait assistance: Min guard Gait Distance (Feet): 30 Feet(x2) Assistive device: Rolling walker (2 wheeled) Gait  Pattern/deviations: Decreased step length - left;Decreased step length - right Gait velocity: decreased   General Gait Details: On room air, oxygen saturation >/=87%. Improved gait quality and less DOE on 2nd gait trial.       Balance Overall balance assessment: Needs assistance         Standing balance support: No upper extremity supported;Bilateral upper extremity supported Standing balance-Leahy Scale: (Fair-)       Pertinent Vitals/Pain Pain Assessment: No/denies pain    Home Living Family/patient expects to be discharged to:: Private residence Living Arrangements: Spouse/significant other Available Help at Discharge: Family;Available 24 hours/day   Home Access: Level entry     Home Layout: One level Home Equipment: Shower seat;Grab bars - tub/shower;Walker - standard;Cane - single point;Bedside commode Additional Comments: Patient reports his wife is there all the time. Denies that there are any stairs.    Prior Function Level of Independence: Independent with assistive device(s);Independent         Comments: Use of SPC or standard walker but not all the time. Sits in a standard armless chair usually.        Extremity/Trunk Assessment        Lower Extremity Assessment Lower Extremity Assessment: LLE deficits/detail;RLE deficits/detail RLE Deficits / Details: RLE grossly 4/5 RLE Sensation: decreased light touch;history of peripheral neuropathy(peripheral neuropathy per patient) LLE Deficits / Details: LLE strength grossly 4/5 LLE Sensation: history of peripheral neuropathy(peripheral neuropathy per patient)          Cognition Arousal/Alertness: Awake/alert   Overall Cognitive Status: (appears to have decreased health literacy)     General Comments General comments (skin integrity, edema, etc.): Patient on room air during session. Oxygen saturation >/=87% with ambulation  in room with RW, improved trial 2 >/=89%. 90% at end of session, HR 93 bpm, RR  19. Oxygen saturation improved during ambulation to 95% after ambulation 67ft each trial. Nurse okayed patient to stay on room air at end of session, oxygen saturation 90%.        Assessment/Plan    PT Assessment Patient needs continued PT services  PT Problem List Decreased strength;Decreased activity tolerance;Decreased balance;Decreased mobility;Decreased knowledge of use of DME;Cardiopulmonary status limiting activity;Decreased safety awareness       PT Treatment Interventions DME instruction;Gait training;Functional mobility training;Therapeutic activities;Therapeutic exercise;Balance training;Patient/family education    PT Goals (Current goals can be found in the Care Plan section)  Acute Rehab PT Goals Time For Goal Achievement: 11/22/19 Potential to Achieve Goals: Good    Frequency Min 3X/week    AM-PAC PT "6 Clicks" Mobility  Outcome Measure Help needed turning from your back to your side while in a flat bed without using bedrails?: A Little Help needed moving from lying on your back to sitting on the side of a flat bed without using bedrails?: A Little Help needed moving to and from a bed to a chair (including a wheelchair)?: A Little Help needed standing up from a chair using your arms (e.g., wheelchair or bedside chair)?: A Little Help needed to walk in hospital room?: A Little Help needed climbing 3-5 steps with a railing? : A Lot 6 Click Score: 17    End of Session Equipment Utilized During Treatment: Gait belt Activity Tolerance: Patient tolerated treatment well Patient left: in chair;with call bell/phone within reach Nurse Communication: Mobility status PT Visit Diagnosis: Unsteadiness on feet (R26.81);Other abnormalities of gait and mobility (R26.89)    Time: [id number] PT Time Calculation (min) (ACUTE ONLY): 28 min   Charges:   PT Evaluation $PT Eval Moderate Complexity: 1 Mod          Birdie Hopes, PT, DPT Acute Rehab [phone]  office    Birdie Hopes 11/09/2019, 2:39 PM

## 2019-11-09 NOTE — Progress Notes (Addendum)
PROGRESS NOTE                                                                                                                                                                                                             Patient Demographics:    Cody Floyd, is a 79 y.o. male, DOB - [birth year], [id number]  Outpatient Primary MD for the patient is Cody Gravel, MD   Admit date - 11/06/2019   LOS - 3  No chief complaint on file.      Brief Narrative: Patient is a 79 y.o. male with PMHx of CAD s/p PCI 2019, HTN, HLD who presented with cough and shortness of breath-was found to have acute hypoxic respiratory failure secondary to COVID-19 pneumonia  Significant Events: 3/14>> admit to [facility] for hypoxia secondary to Covid 19 PNA  Microbiology data: None  COVID-19 medications Remdesivir: 3/14>> Steroids: 3/14  DVT prophylaxis: Lovenox   Procedures: None  Consults: None   Subjective:    [name] today remains on 1-2 L of oxygen.  He is stable at rest but claims that he gets short of breath with minimal movement.  Continues to have coughing spells.   Assessment  & Plan :   Acute Hypoxic Resp Failure due to Covid 19 Viral pneumonia: Slowly improving-remains on 1-2 L of oxygen.  CRP downtrending-continue steroids/remdesivir.  Does have some mild/trace lower extremity edema-we will give 1 dose of Lasix-and try to maintain a negative balance.  Fever: afebrile  O2 requirements:  SpO2: (!) 86 % O2 Flow Rate (L/min): 1 L/min   COVID-19 Labs: Recent Labs    11/06/19 1429 11/06/19 1638 11/07/19 0250 11/08/19 0119 11/09/19 0225  DDIMER 2.63*   < > 1.63* 1.26* 0.85*  FERRITIN  --    < > 294 330 303  LDH 336*  --   --   --   --   CRP  --    < > 3.1* 1.8* 0.7   < > = values in this interval not displayed.    No results found for: BNP  Recent Labs  Lab 11/06/19 1429  PROCALCITON <0.10    Lab Results   Component Value Date   SARSCOV2NAA NOT DETECTED 05/20/2019     Prone/Incentive Spirometry: encouraged  incentive spirometry use 3-4/hour  DVT Prophylaxis  :  Lovenox  CAD  s/p PCI: No anginal symptoms-continue aspirin/Plavix/Lipitor/Imdur  HTN: Controlled-continue Norvasc, Imdur and Hytrin  HLD: Continue Lipitor  Restless leg syndrome: Stable with Mirapex  Nutrition Problem: Nutrition Problem: Increased nutrient needs Etiology: acute illness(COVID) Signs/Symptoms: estimated needs Interventions: Ensure Enlive (each supplement provides 350kcal and 20 grams of protein)   ABG: No results found for: PHART, PCO2ART, PO2ART, HCO3, TCO2, ACIDBASEDEF, O2SAT  Vent Settings: N/A   Condition -Stable  Family Communication  :  Significant other updated over the phone on 3/17  Code Status :  Full Code  Diet :  Diet Order            DIET DYS 3 Room service appropriate? Yes; Fluid consistency: Thin  Diet effective now               Disposition Plan  :  Remain hospitalized-hopefully home with home health services over the next few days.  Barriers to discharge: Hypoxia requiring O2 supplementation/complete 5 days of IV Remdesivir  Antimicorbials  :    Anti-infectives (From admission, onward)   Start     Dose/Rate Route Frequency Ordered Stop   11/07/19 1000  remdesivir 100 mg in sodium chloride 0.9 % 100 mL IVPB  Status:  Discontinued     100 mg 200 mL/hr over 30 Minutes Intravenous Daily 11/06/19 1545 11/06/19 1556   11/07/19 1000  remdesivir 100 mg in sodium chloride 0.9 % 100 mL IVPB     100 mg 200 mL/hr over 30 Minutes Intravenous Daily 11/06/19 1558 11/11/19 0959   11/06/19 1630  remdesivir 100 mg in sodium chloride 0.9 % 100 mL IVPB     100 mg 200 mL/hr over 30 Minutes Intravenous  Once 11/06/19 1558 11/07/19 0208   11/06/19 1600  remdesivir 100 mg in sodium chloride 0.9 % 100 mL IVPB     100 mg 200 mL/hr over 30 Minutes Intravenous  Once 11/06/19 1558 11/06/19 1910    11/06/19 1545  remdesivir 200 mg in sodium chloride 0.9% 250 mL IVPB  Status:  Discontinued     200 mg 580 mL/hr over 30 Minutes Intravenous Once 11/06/19 1545 11/06/19 1556   11/06/19 1515  cefTRIAXone (ROCEPHIN) 1 g in sodium chloride 0.9 % 100 mL IVPB     1 g 200 mL/hr over 30 Minutes Intravenous  Once 11/06/19 1510 11/06/19 1636   11/06/19 1515  azithromycin (ZITHROMAX) 500 mg in sodium chloride 0.9 % 250 mL IVPB     500 mg 250 mL/hr over 60 Minutes Intravenous  Once 11/06/19 1510 11/06/19 1800      Inpatient Medications  Scheduled Meds: . amLODipine  5 mg Oral Daily  . aspirin EC  81 mg Oral Daily  . atorvastatin  20 mg Oral Daily  . benzonatate  200 mg Oral TID  . clopidogrel  75 mg Oral Q breakfast  . enoxaparin (LOVENOX) injection  40 mg Subcutaneous Q24H  . feeding supplement (ENSURE ENLIVE)  237 mL Oral BID BM  . fluticasone  2 spray Each Nare Daily  . isosorbide mononitrate  30 mg Oral Daily  . methylPREDNISolone (SOLU-MEDROL) injection  50 mg Intravenous Q12H  . pantoprazole  40 mg Oral Daily  . pramipexole  0.5 mg Oral QHS  . senna-docusate  3 tablet Oral QHS  . sodium chloride flush  3 mL Intravenous Q12H  . terazosin  5 mg Oral QHS   Continuous Infusions: . sodium chloride    . remdesivir 100 mg in NS 100 mL 100 mg (11/09/19  0833)   PRN Meds:.sodium chloride, acetaminophen, albuterol, bisacodyl, chlorpheniramine-HYDROcodone, guaiFENesin-dextromethorphan, menthol-cetylpyridinium, ondansetron **OR** ondansetron (ZOFRAN) IV, oxyCODONE, oxymetazoline, polyethylene glycol, simethicone, sodium chloride flush, sodium phosphate   Time Spent in minutes  25  See all Orders from today for further details   Oren Binet M.D on 11/09/2019 at 1:58 PM  To page go to [email or web address] - use universal password  [facility] -  Office  [phone]    Objective:   Vitals:   11/09/19 0026 11/09/19 0400 11/09/19 0800 11/09/19 1342  BP: 128/60 (!) 123/57 (!)  143/73   Pulse: 64 61 71 82  Resp: (!) 22 (!) 21 19 (!) 24  Temp: 98 F (36.7 C) 97.9 F (36.6 C)    TempSrc: Oral Oral    SpO2: 91% 94% 90% (!) 86%  Weight:      Height:        Wt Readings from Last 3 Encounters:  11/06/19 101.4 kg  10/19/19 111.1 kg  08/29/19 110.7 kg     Intake/Output Summary (Last 24 hours) at 11/09/2019 1358 Last data filed at 11/09/2019 1249 Gross per 24 hour  Intake 260 ml  Output 1370 ml  Net -1110 ml     Physical Exam Gen Exam:Alert awake-not in any distress HEENT:atraumatic, normocephalic Chest: B/L clear to auscultation anteriorly CVS:S1S2 regular Abdomen:soft non tender, non distended Extremities: Trace edema Neurology: Non focal Skin: no rash   Data Review:    CBC Recent Labs  Lab 11/06/19 1327 11/07/19 0250 11/08/19 0119 11/09/19 0225  WBC 4.0 2.4* 7.7 8.1  HGB 14.5 13.0 13.5 12.6*  HCT 43.5 38.4* 40.5 37.6*  PLT 94* 87* 120* 122*  MCV 80.9 80.0 81.0 80.9  MCH 27.0 27.1 27.0 27.1  MCHC 33.3 33.9 33.3 33.5  RDW 15.6* 15.3 15.2 15.3  LYMPHSABS 0.9 0.4* 0.8 0.7  MONOABS 0.2 0.1 0.3 0.4  EOSABS 0.0 0.0 0.0 0.0  BASOSABS 0.0 0.0 0.0 0.0    Chemistries  Recent Labs  Lab 11/06/19 1327 11/07/19 0250 11/08/19 0119 11/09/19 0225  NA 134* 134* 134* 136  K 3.9 3.5 3.9 3.8  CL 103 106 103 105  CO2 20* 21* 21* 21*  GLUCOSE 107* 185* 165* 151*  BUN 20 21 26* 29*  CREATININE 1.09 0.98 1.10 0.98  CALCIUM 8.2* 8.0* 8.6* 8.4*  MG  --  1.9 2.0 1.9  AST  --  61* 59* 60*  ALT  --  38 39 50*  ALKPHOS  --  56 48 46  BILITOT  --  0.8 0.6 0.9   ------------------------------------------------------------------------------------------------------------------ No results for input(s): CHOL, HDL, LDLCALC, TRIG, CHOLHDL, LDLDIRECT in the last 72 hours.  Lab Results  Component Value Date   HGBA1C 5.6 07/30/2018   ------------------------------------------------------------------------------------------------------------------ No  results for input(s): TSH, T4TOTAL, T3FREE, THYROIDAB in the last 72 hours.  Invalid input(s): FREET3 ------------------------------------------------------------------------------------------------------------------ Recent Labs    11/08/19 0119 11/09/19 0225  FERRITIN 330 303    Coagulation profile No results for input(s): INR, PROTIME in the last 168 hours.  Recent Labs    11/08/19 0119 11/09/19 0225  DDIMER 1.26* 0.85*    Cardiac Enzymes No results for input(s): CKMB, TROPONINI, MYOGLOBIN in the last 168 hours.  Invalid input(s): CK ------------------------------------------------------------------------------------------------------------------ No results found for: BNP  Micro Results No results found for this or any previous visit (from the past 240 hour(s)).  Radiology Reports DG Chest Port 1 View  Result Date: 11/06/2019 CLINICAL DATA:  Cough. COVID positive. EXAM: PORTABLE CHEST 1 VIEW  COMPARISON:  03/20/2017 chest radiograph FINDINGS: UPPER limits normal heart size noted. Patchy RIGHT LOWER lung opacity noted likely representing infection/pneumonia. No pulmonary mass, pleural effusion or pneumothorax. No acute bony abnormalities are present. IMPRESSION: Patchy RIGHT LOWER lung opacity likely representing infection/pneumonia. Electronically Signed   By: Margarette Canada M.D.   On: 11/06/2019 14:26

## 2019-11-09 NOTE — Progress Notes (Signed)
Occupational Therapy Evaluation Patient Details Name: Cody Floyd MRN: [id number] DOB: [birth year] Today's Date: 11/09/2019    History of Present Illness 79 year old male who presented with cough and shortness of breath and found to have acute hypoxic respiratory failure secondary to COVID-19 PNA. He was admitted 11/06/19 and REmdesivir and steroids started. CRP downtrending. PMH: CAD s/p PCI 2019, HTN, HLD   Clinical Impression   PTA, pt lived with significant other and was Modified Independent for ADLs, IADLs using cane and/or walker. Pt received seated in recliner chair on 1 L O2. Trialed RA during OT evaluation. Pt Supervision for sit to stand, min guard progressing to supervision for stand pivot and mobility in room with RW. O2 dropped to 89% briefly before quickly returning and sustaining in low 90s. Pt able to demonstrate ability to doff socks bending down to feet. Pt reports occasionally using AE for LB dressing, such as sock aide, but does not regularly wear socks. Assessed carryover of flutter valve (x 10) and incentive spirometer (up to 255mL x 5) with minimal cues needed for use. Recommend HHOT for safety evaluation of home, intermittent supervision. Will continue to follow acutely.     Follow Up Recommendations  Home health OT;Supervision - Intermittent    Equipment Recommendations  None recommended by OT    Recommendations for Other Services       Precautions / Restrictions Precautions Precautions: Fall;Other (comment) Precaution Comments: monitor oxygen saturation, dysphagia 3 with thin liquids Restrictions Weight Bearing Restrictions: No      Mobility Bed Mobility               General bed mobility comments: Patient already OBO in chair  Transfers Overall transfer level: Needs assistance Equipment used: Rolling walker (2 wheeled) Transfers: Sit to/from [employer] Transfers Sit to Stand: Supervision Stand pivot transfers: Min guard;Supervision        General transfer comment: Pt Supervision for sit to stand, min guard progressing to supervision for stand pivot with RW    Balance Overall balance assessment: Needs assistance Sitting-balance support: Feet supported Sitting balance-Leahy Scale: Good     Standing balance support: Bilateral upper extremity supported Standing balance-Leahy Scale: Fair                             ADL either performed or assessed with clinical judgement   ADL Overall ADL's : Needs assistance/impaired Eating/Feeding: Independent;Sitting   Grooming: Set up;Sitting   Upper Body Bathing: Supervision/ safety;Sitting   Lower Body Bathing: Supervison/ safety;Sit to/from stand   Upper Body Dressing : Set up;Sitting   Lower Body Dressing: Set up;Supervision/safety;Sit to/from stand;Sitting/lateral leans Lower Body Dressing Details (indicate cue type and reason): Pt able to demo ability to doff socks bending to feet with increased time. Pt reports using AE for dressing sometimes (sock aid, etc) Toilet Transfer: Min guard   Toileting- Clothing Manipulation and Hygiene: Supervision/safety       Functional mobility during ADLs: Min guard;Supervision/safety;Rolling walker General ADL Comments: Pt min guard at most to ensure safety in standing      Vision         Perception     Praxis      Pertinent Vitals/Pain Pain Assessment: No/denies pain     Hand Dominance Right   Extremity/Trunk Assessment Upper Extremity Assessment Upper Extremity Assessment: Overall WFL for tasks assessed   Lower Extremity Assessment Lower Extremity Assessment: Defer to PT evaluation RLE Deficits /  Details: RLE grossly 4/5 RLE Sensation: (peripheral neuropathy per patient) LLE Deficits / Details: LLE strength grossly 4/5 LLE Sensation: (peripheral neuropathy per patient)       Communication Communication Communication: HOH   Cognition Arousal/Alertness: Awake/alert Behavior During Therapy: WFL  for tasks assessed/performed Overall Cognitive Status: Within Functional Limits for tasks assessed                                     General Comments  Pt received on 1 L O2. Trialed on RA with stats briefly at 89%, but quickly increased to 94%    Exercises Exercises: Other exercises Other Exercises Other Exercises: pursed lip breathing Other Exercises: flutter valve x 10 Other Exercises: incentive spirometer x 5 (up to 2531mL)   Shoulder Instructions      Home Living Family/patient expects to be discharged to:: Private residence Living Arrangements: Spouse/significant other Available Help at Discharge: Family;Available 24 hours/day   Home Access: Level entry     Home Layout: One level     Bathroom Shower/Tub: Walk-in shower         Home Equipment: Shower seat;Grab bars - tub/shower;Walker - standard;Cane - single point;Bedside commode   Additional Comments: Patient reports his wife is there all the time. Denies that there are any stairs.      Prior Functioning/Environment Level of Independence: Independent with assistive device(s);Independent        Comments: Use of SPC or standard walker but not all the time. Sits in a standard armless chair usually.        OT Problem List: Decreased activity tolerance;Impaired balance (sitting and/or standing);Decreased safety awareness;Decreased knowledge of use of DME or AE;Cardiopulmonary status limiting activity      OT Treatment/Interventions: Self-care/ADL training;Therapeutic exercise;Energy conservation;DME and/or AE instruction;Therapeutic activities;Patient/family education    OT Goals(Current goals can be found in the care plan section) Acute Rehab OT Goals Patient Stated Goal: get better, get rid of cough OT Goal Formulation: With patient Time For Goal Achievement: 11/23/19 Potential to Achieve Goals: Good ADL Goals Pt Will Perform Grooming: with modified independence;standing Pt Will Perform  Lower Body Bathing: with modified independence;sit to/from stand Pt Will Perform Lower Body Dressing: with modified independence;sit to/from stand Pt Will Transfer to Toilet: with modified independence;ambulating;regular height toilet Pt Will Perform Toileting - Clothing Manipulation and hygiene: with modified independence;sit to/from stand;sitting/lateral leans Additional ADL Goal #1: Pt will verbalize at least 3 energy conservation strategies in order to maximize ADL/IADL independence.  OT Frequency: Min 3X/week   Barriers to D/C:            Co-evaluation              AM-PAC OT "6 Clicks" Daily Activity     Outcome Measure Help from another person eating meals?: None Help from another person taking care of personal grooming?: A Little Help from another person toileting, which includes using toliet, bedpan, or urinal?: A Little Help from another person bathing (including washing, rinsing, drying)?: A Little Help from another person to put on and taking off regular upper body clothing?: A Little Help from another person to put on and taking off regular lower body clothing?: A Little 6 Click Score: 19   End of Session Equipment Utilized During Treatment: Gait belt;Rolling walker Nurse Communication: Mobility status;Other (comment)(stats )  Activity Tolerance: Patient tolerated treatment well Patient left: in chair;with call bell/phone within reach  OT Visit  Diagnosis: Unsteadiness on feet (R26.81);Other abnormalities of gait and mobility (R26.89)                Time: [id number] OT Time Calculation (min): 23 min Charges:  OT General Charges $OT Visit: 1 Visit OT Evaluation $OT Eval Moderate Complexity: 1 Mod OT Treatments $Therapeutic Activity: 8-22 mins  Layla Maw, OTR/L  Layla Maw 11/09/2019, 4:03 PM

## 2019-11-09 NOTE — Consult Note (Signed)
   [facility] CM Inpatient Consult   11/09/2019  Cody Floyd Sep 07, 1940 [id number]   Patient screened for potential [place] in the [employer] for [employer] benefits and services. Review of patient's History and Physical on 11/06/19 3:26 PM by Dr. Karmen Bongo which includes but not limited to progress notes as follows:  Cody Floyd is a 79 y.o. male with medical history significant of RLS; HTN; HLD; chronic back pain; and CAD s/p complex intervention to the LAD with DES in 10/19 presenting with worsening COVID-19 symptoms.  He received his initial COVID-19 vaccine on 2/26 but was infected around that time.  He reports ongoing coughing.   No fevers.  +loss of smell and taste.  +SOB with ambulation, conversational dyspnea with repetitive coughing.  *Noted that patient has a second COVID vaccine for November 15, 2019 schedule, may need to be advised for follow up time frames for post quarantine period in post hospital transition needs.  Primary Care Provider is Jani Gravel, MD, a [facility] provider this provider is listed to do the post hospital transition of care [TOC] follow up  Plan:  Will follow inpatient [facility] team with transition of care needs as appropriate. Continue to follow progress and disposition to assess for post hospital care management needs.    Please place a [facility] Care Management consult as appropriate and for questions contact:   Natividad Brood, RN BSN [facility] Liaison  [phone] business mobile phone Toll free office [phone]  Fax number: [phone] [email or web address]

## 2019-11-09 NOTE — TOC Initial Note (Signed)
Transition of Care [facility]) - Initial/Assessment Note    Patient Details  Name: Cody Floyd MRN: [id number] Date of Birth: 1940-12-19  [facility] CM/SW Contact:    Maryclare Labrador, RN Phone Number: 11/09/2019, 3:47 PM  Clinical Narrative:    PTA independent from home with wife.  Pt states he has PCP and denied NC360 barriers and hardship with medication cost.  Pt declined both HH and RW.  CM attempted to inform of benefit of recommended therapy - pt was adamant that he was not interested.  TOC will continue to follow                  Expected Discharge Plan: Home/Self Care Barriers to Discharge: Continued Medical Work up   Patient Goals and CMS Choice        Expected Discharge Plan and Services Expected Discharge Plan: Home/Self Care       Living arrangements for the past 2 months: Single Family Home                           HH Arranged: Refused HH          Prior Living Arrangements/Services Living arrangements for the past 2 months: Single Family Home Lives with:: Spouse Patient language and need for interpreter reviewed:: Yes        Need for Family Participation in Patient Care: Yes (Comment) Care giver support system in place?: Yes (comment)   Criminal Activity/Legal Involvement Pertinent to Current Situation/Hospitalization: No - Comment as needed  Activities of Daily Living Home Assistive Devices/Equipment: None ADL Screening (condition at time of admission) Patient's cognitive ability adequate to safely complete daily activities?: Yes Is the patient deaf or have difficulty hearing?: No Does the patient have difficulty seeing, even when wearing glasses/contacts?: No Does the patient have difficulty concentrating, remembering, or making decisions?: No Patient able to express need for assistance with ADLs?: Yes Does the patient have difficulty dressing or bathing?: No Independently performs ADLs?: Yes (appropriate for developmental  age) Does the patient have difficulty walking or climbing stairs?: No Weakness of Legs: None Weakness of Arms/Hands: None  Permission Sought/Granted                  Emotional Assessment   Attitude/Demeanor/Rapport: Apprehensive, Guarded Affect (typically observed): Agitated Orientation: : Oriented to Self, Oriented to Place, Oriented to  Time, Oriented to Situation   Psych Involvement: No (comment)  Admission diagnosis:  Pneumonia due to COVID-19 virus [U07.1, J12.82] Acute respiratory disease due to COVID-19 virus [U07.1, J06.9] Patient Active Problem List   Diagnosis Date Noted  . Acute respiratory disease due to COVID-19 virus 11/06/2019  . Restless legs syndrome 11/06/2019  . Essential hypertension 11/06/2019  . Dyslipidemia 11/06/2019  . CAD (coronary artery disease), native coronary artery 11/06/2019  . Arthritis of right knee 05/24/2019  . Peripheral arterial disease ([place]) 03/08/2019  . Post PTCA 06/01/2018  . Angina pectoris ([place]) 05/31/2018  . Arthritis of knee 03/31/2017  . Spinal stenosis, lumbar region, with neurogenic claudication 05/29/2014    Class: Chronic  . Left lumbar radiculopathy 05/29/2014    Class: Chronic   PCP:  Jani Gravel, MD Pharmacy:   [facility], [place] [address] Phone: [phone] Fax: [place], [place] [address]  [address] [place] [address] Phone: [phone] Fax: [phone]     Social Determinants of Health (SDOH) Interventions    Readmission Risk Interventions No flowsheet data found.

## 2019-11-10 LAB — CBC WITH DIFFERENTIAL/PLATELET
Abs Immature Granulocytes: 0.03 10*3/uL (ref 0.00–0.07)
Basophils Absolute: 0 10*3/uL (ref 0.0–0.1)
Basophils Relative: 0 %
Eosinophils Absolute: 0 10*3/uL (ref 0.0–0.5)
Eosinophils Relative: 0 %
HCT: 38.9 % — ABNORMAL LOW (ref 39.0–52.0)
Hemoglobin: 13.2 g/dL (ref 13.0–17.0)
Immature Granulocytes: 1 %
Lymphocytes Relative: 13 %
Lymphs Abs: 0.8 10*3/uL (ref 0.7–4.0)
MCH: 27 pg (ref 26.0–34.0)
MCHC: 33.9 g/dL (ref 30.0–36.0)
MCV: 79.7 fL — ABNORMAL LOW (ref 80.0–100.0)
Monocytes Absolute: 0.5 10*3/uL (ref 0.1–1.0)
Monocytes Relative: 7 %
Neutro Abs: 5.1 10*3/uL (ref 1.7–7.7)
Neutrophils Relative %: 79 %
Platelets: 112 10*3/uL — ABNORMAL LOW (ref 150–400)
RBC: 4.88 MIL/uL (ref 4.22–5.81)
RDW: 15.2 % (ref 11.5–15.5)
WBC: 6.4 10*3/uL (ref 4.0–10.5)
nRBC: 0 % (ref 0.0–0.2)

## 2019-11-10 LAB — COMPREHENSIVE METABOLIC PANEL
ALT: 65 U/L — ABNORMAL HIGH (ref 0–44)
AST: 65 U/L — ABNORMAL HIGH (ref 15–41)
Albumin: 3.1 g/dL — ABNORMAL LOW (ref 3.5–5.0)
Alkaline Phosphatase: 48 U/L (ref 38–126)
Anion gap: 12 (ref 5–15)
BUN: 29 mg/dL — ABNORMAL HIGH (ref 8–23)
CO2: 24 mmol/L (ref 22–32)
Calcium: 8.4 mg/dL — ABNORMAL LOW (ref 8.9–10.3)
Chloride: 102 mmol/L (ref 98–111)
Creatinine, Ser: 1.05 mg/dL (ref 0.61–1.24)
GFR calc Af Amer: >60 mL/min (ref >60)
GFR calc non Af Amer: >60 mL/min (ref >60)
Glucose, Bld: 139 mg/dL — ABNORMAL HIGH (ref 70–99)
Potassium: 3.9 mmol/L (ref 3.5–5.1)
Sodium: 138 mmol/L (ref 135–145)
Total Bilirubin: 1 mg/dL (ref 0.3–1.2)
Total Protein: 5.5 g/dL — ABNORMAL LOW (ref 6.5–8.1)

## 2019-11-10 LAB — C-REACTIVE PROTEIN: CRP: 0.5 mg/dL (ref <1.0)

## 2019-11-10 LAB — D-DIMER, QUANTITATIVE: D-Dimer, Quant: 0.92 ug/mL-FEU — ABNORMAL HIGH (ref 0.00–0.50)

## 2019-11-10 LAB — FERRITIN: Ferritin: 311 ng/mL (ref 24–336)

## 2019-11-10 MED ORDER — FUROSEMIDE 10 MG/ML IJ SOLN
20.0000 mg | Freq: Once | INTRAMUSCULAR | Status: AC
  Administered 2019-11-10: 20 mg via INTRAVENOUS
  Filled 2019-11-10: qty 2

## 2019-11-10 NOTE — Progress Notes (Signed)
PROGRESS NOTE                                                                                                                                                                                                             Patient Demographics:    Cody Floyd, is a 79 y.o. male, DOB - Mar 03, 1941, [id number]  Outpatient Primary MD for the patient is Jani Gravel, MD   Admit date - 11/06/2019   LOS - 4  No chief complaint on file.      Brief Narrative: Patient is a 79 y.o. male with PMHx of CAD s/p PCI 2019, HTN, HLD who presented with cough and shortness of breath-was found to have acute hypoxic respiratory failure secondary to COVID-19 pneumonia  Significant Events: 3/14>> admit to [facility] for hypoxia secondary to Covid 19 PNA  Microbiology data: None  COVID-19 medications Remdesivir: 3/14>> Steroids: 3/14  DVT prophylaxis: Lovenox   Procedures: None  Consults: None   Subjective:   No major issues overnight-stable on 1-2 L of oxygen.  Continues to have coughing spells.  Appears frail and weak.   Assessment  & Plan :   Acute Hypoxic Resp Failure due to Covid 19 Viral pneumonia: Continues to slowly improve-coughing spells continue.  Appears frail and weak.  CRP has normalized.  Will complete remdesivir on 3/18-remains on steroids.  Repeat IV Lasix today-attempt to keep in negative balance-lower extremity edema today has improved.  Fever: afebrile  O2 requirements:  SpO2: 92 % O2 Flow Rate (L/min): 2 L/min   COVID-19 Labs: Recent Labs    11/08/19 0119 11/09/19 0225 11/10/19 0616  DDIMER 1.26* 0.85* 0.92*  FERRITIN 330 303 311  CRP 1.8* 0.7 0.5    No results found for: BNP  Recent Labs  Lab 11/06/19 1429  PROCALCITON <0.10    Lab Results  Component Value Date   SARSCOV2NAA NOT DETECTED 05/20/2019     Prone/Incentive Spirometry: encouraged  incentive spirometry use 3-4/hour  DVT Prophylaxis   :  Lovenox  CAD s/p PCI: No anginal symptoms-continue aspirin/Plavix/Lipitor/Imdur  HTN: Controlled-continue Norvasc, Imdur and Hytrin  HLD: Continue Lipitor  Restless leg syndrome: Stable with Mirapex  Nutrition Problem: Nutrition Problem: Increased nutrient needs Etiology: acute illness(COVID) Signs/Symptoms: estimated needs Interventions: Ensure Enlive (each supplement provides 350kcal and 20 grams of protein)   ABG: No results found  for: PHART, PCO2ART, PO2ART, HCO3, TCO2, ACIDBASEDEF, O2SAT  Vent Settings: N/A   Condition -Stable  Family Communication  :  Significant other updated over the phone on 3/18  Code Status :  Full Code  Diet :  Diet Order            DIET DYS 3 Room service appropriate? Yes; Fluid consistency: Thin  Diet effective now               Disposition Plan  :  Remain hospitalized-hopefully home with home health services  Barriers to discharge: Hypoxia requiring O2 supplementation/complete 5 days of IV Remdesivir  Antimicorbials  :    Anti-infectives (From admission, onward)   Start     Dose/Rate Route Frequency Ordered Stop   11/07/19 1000  remdesivir 100 mg in sodium chloride 0.9 % 100 mL IVPB  Status:  Discontinued     100 mg 200 mL/hr over 30 Minutes Intravenous Daily 11/06/19 1545 11/06/19 1556   11/07/19 1000  remdesivir 100 mg in sodium chloride 0.9 % 100 mL IVPB     100 mg 200 mL/hr over 30 Minutes Intravenous Daily 11/06/19 1558 11/10/19 0906   11/06/19 1630  remdesivir 100 mg in sodium chloride 0.9 % 100 mL IVPB     100 mg 200 mL/hr over 30 Minutes Intravenous  Once 11/06/19 1558 11/07/19 0208   11/06/19 1600  remdesivir 100 mg in sodium chloride 0.9 % 100 mL IVPB     100 mg 200 mL/hr over 30 Minutes Intravenous  Once 11/06/19 1558 11/06/19 1910   11/06/19 1545  remdesivir 200 mg in sodium chloride 0.9% 250 mL IVPB  Status:  Discontinued     200 mg 580 mL/hr over 30 Minutes Intravenous Once 11/06/19 1545 11/06/19 1556    11/06/19 1515  cefTRIAXone (ROCEPHIN) 1 g in sodium chloride 0.9 % 100 mL IVPB     1 g 200 mL/hr over 30 Minutes Intravenous  Once 11/06/19 1510 11/06/19 1636   11/06/19 1515  azithromycin (ZITHROMAX) 500 mg in sodium chloride 0.9 % 250 mL IVPB     500 mg 250 mL/hr over 60 Minutes Intravenous  Once 11/06/19 1510 11/06/19 1800      Inpatient Medications  Scheduled Meds: . amLODipine  5 mg Oral Daily  . aspirin EC  81 mg Oral Daily  . atorvastatin  20 mg Oral Daily  . benzonatate  200 mg Oral TID  . clopidogrel  75 mg Oral Q breakfast  . enoxaparin (LOVENOX) injection  40 mg Subcutaneous Q24H  . feeding supplement (ENSURE ENLIVE)  237 mL Oral BID BM  . fluticasone  2 spray Each Nare Daily  . isosorbide mononitrate  30 mg Oral Daily  . methylPREDNISolone (SOLU-MEDROL) injection  40 mg Intravenous Q12H  . pantoprazole  40 mg Oral Daily  . pramipexole  0.5 mg Oral QHS  . senna-docusate  3 tablet Oral QHS  . sodium chloride flush  3 mL Intravenous Q12H  . terazosin  5 mg Oral QHS   Continuous Infusions: . sodium chloride     PRN Meds:.sodium chloride, acetaminophen, albuterol, bisacodyl, chlorpheniramine-HYDROcodone, guaiFENesin-dextromethorphan, menthol-cetylpyridinium, ondansetron **OR** ondansetron (ZOFRAN) IV, oxyCODONE, oxymetazoline, polyethylene glycol, simethicone, sodium chloride flush, sodium phosphate   Time Spent in minutes  25  See all Orders from today for further details   Oren Binet M.D on 11/10/2019 at 2:29 PM  To page go to [email or web address] - use universal password  [facility] -  Office  [phone]  Objective:   Vitals:   11/09/19 2102 11/10/19 0445 11/10/19 0512 11/10/19 0800  BP: 118/88 (!) 151/69  (!) 151/68  Pulse: 78 67  73  Resp: (!) 22 18  (!) 22  Temp: 97.7 F (36.5 C)  97.7 F (36.5 C)   TempSrc: Oral  Oral   SpO2: 97% 96%  92%  Weight:      Height:        Wt Readings from Last 3 Encounters:  11/06/19 101.4 kg  10/19/19  111.1 kg  08/29/19 110.7 kg     Intake/Output Summary (Last 24 hours) at 11/10/2019 1429 Last data filed at 11/10/2019 1300 Gross per 24 hour  Intake 1221.47 ml  Output 1700 ml  Net -478.53 ml     Physical Exam Gen Exam:Alert awake-not in any distress HEENT:atraumatic, normocephalic Chest: B/L clear to auscultation anteriorly CVS:S1S2 regular Abdomen:soft non tender, non distended Extremities:no edema Neurology: Non focal Skin: no rash   Data Review:    CBC Recent Labs  Lab 11/06/19 1327 11/07/19 0250 11/08/19 0119 11/09/19 0225 11/10/19 0616  WBC 4.0 2.4* 7.7 8.1 6.4  HGB 14.5 13.0 13.5 12.6* 13.2  HCT 43.5 38.4* 40.5 37.6* 38.9*  PLT 94* 87* 120* 122* 112*  MCV 80.9 80.0 81.0 80.9 79.7*  MCH 27.0 27.1 27.0 27.1 27.0  MCHC 33.3 33.9 33.3 33.5 33.9  RDW 15.6* 15.3 15.2 15.3 15.2  LYMPHSABS 0.9 0.4* 0.8 0.7 0.8  MONOABS 0.2 0.1 0.3 0.4 0.5  EOSABS 0.0 0.0 0.0 0.0 0.0  BASOSABS 0.0 0.0 0.0 0.0 0.0    Chemistries  Recent Labs  Lab 11/06/19 1327 11/07/19 0250 11/08/19 0119 11/09/19 0225 11/10/19 0616  NA 134* 134* 134* 136 138  K 3.9 3.5 3.9 3.8 3.9  CL 103 106 103 105 102  CO2 20* 21* 21* 21* 24  GLUCOSE 107* 185* 165* 151* 139*  BUN 20 21 26* 29* 29*  CREATININE 1.09 0.98 1.10 0.98 1.05  CALCIUM 8.2* 8.0* 8.6* 8.4* 8.4*  MG  --  1.9 2.0 1.9  --   AST  --  61* 59* 60* 65*  ALT  --  38 39 50* 65*  ALKPHOS  --  56 48 46 48  BILITOT  --  0.8 0.6 0.9 1.0   ------------------------------------------------------------------------------------------------------------------ No results for input(s): CHOL, HDL, LDLCALC, TRIG, CHOLHDL, LDLDIRECT in the last 72 hours.  Lab Results  Component Value Date   HGBA1C 5.6 07/30/2018   ------------------------------------------------------------------------------------------------------------------ No results for input(s): TSH, T4TOTAL, T3FREE, THYROIDAB in the last 72 hours.  Invalid input(s):  FREET3 ------------------------------------------------------------------------------------------------------------------ Recent Labs    11/09/19 0225 11/10/19 0616  FERRITIN 303 311    Coagulation profile No results for input(s): INR, PROTIME in the last 168 hours.  Recent Labs    11/09/19 0225 11/10/19 0616  DDIMER 0.85* 0.92*    Cardiac Enzymes No results for input(s): CKMB, TROPONINI, MYOGLOBIN in the last 168 hours.  Invalid input(s): CK ------------------------------------------------------------------------------------------------------------------ No results found for: BNP  Micro Results No results found for this or any previous visit (from the past 240 hour(s)).  Radiology Reports DG Chest Port 1 View  Result Date: 11/06/2019 CLINICAL DATA:  Cough. COVID positive. EXAM: PORTABLE CHEST 1 VIEW COMPARISON:  03/20/2017 chest radiograph FINDINGS: UPPER limits normal heart size noted. Patchy RIGHT LOWER lung opacity noted likely representing infection/pneumonia. No pulmonary mass, pleural effusion or pneumothorax. No acute bony abnormalities are present. IMPRESSION: Patchy RIGHT LOWER lung opacity likely representing infection/pneumonia. Electronically Signed  By: Margarette Canada M.D.   On: 11/06/2019 14:26

## 2019-11-10 NOTE — Progress Notes (Signed)
SATURATION QUALIFICATIONS: (This note is used to comply with regulatory documentation for home oxygen)  Patient Saturations on Room Air at Rest = 87 %  Patient Saturations on Room Air while Ambulating = 85%  Patient Saturations on 2 Liters of oxygen while Ambulating = 90%  Please briefly explain why patient needs home oxygen:  Pt need oxygen to ambulate and while resting

## 2019-11-11 DIAGNOSIS — I10 Essential (primary) hypertension: Secondary | ICD-10-CM

## 2019-11-11 DIAGNOSIS — G2581 Restless legs syndrome: Secondary | ICD-10-CM

## 2019-11-11 LAB — CBC WITH DIFFERENTIAL/PLATELET
Abs Immature Granulocytes: 0.07 10*3/uL (ref 0.00–0.07)
Basophils Absolute: 0 10*3/uL (ref 0.0–0.1)
Basophils Relative: 0 %
Eosinophils Absolute: 0 10*3/uL (ref 0.0–0.5)
Eosinophils Relative: 0 %
HCT: 39.2 % (ref 39.0–52.0)
Hemoglobin: 12.9 g/dL — ABNORMAL LOW (ref 13.0–17.0)
Immature Granulocytes: 1 %
Lymphocytes Relative: 10 %
Lymphs Abs: 0.7 10*3/uL (ref 0.7–4.0)
MCH: 26.5 pg (ref 26.0–34.0)
MCHC: 32.9 g/dL (ref 30.0–36.0)
MCV: 80.7 fL (ref 80.0–100.0)
Monocytes Absolute: 0.4 10*3/uL (ref 0.1–1.0)
Monocytes Relative: 5 %
Neutro Abs: 5.5 10*3/uL (ref 1.7–7.7)
Neutrophils Relative %: 84 %
Platelets: 113 10*3/uL — ABNORMAL LOW (ref 150–400)
RBC: 4.86 MIL/uL (ref 4.22–5.81)
RDW: 15.1 % (ref 11.5–15.5)
WBC: 6.6 10*3/uL (ref 4.0–10.5)
nRBC: 0 % (ref 0.0–0.2)

## 2019-11-11 LAB — COMPREHENSIVE METABOLIC PANEL
ALT: 71 U/L — ABNORMAL HIGH (ref 0–44)
AST: 57 U/L — ABNORMAL HIGH (ref 15–41)
Albumin: 3 g/dL — ABNORMAL LOW (ref 3.5–5.0)
Alkaline Phosphatase: 50 U/L (ref 38–126)
Anion gap: 10 (ref 5–15)
BUN: 28 mg/dL — ABNORMAL HIGH (ref 8–23)
CO2: 26 mmol/L (ref 22–32)
Calcium: 8.2 mg/dL — ABNORMAL LOW (ref 8.9–10.3)
Chloride: 101 mmol/L (ref 98–111)
Creatinine, Ser: 0.96 mg/dL (ref 0.61–1.24)
GFR calc Af Amer: >60 mL/min (ref >60)
GFR calc non Af Amer: >60 mL/min (ref >60)
Glucose, Bld: 163 mg/dL — ABNORMAL HIGH (ref 70–99)
Potassium: 4.2 mmol/L (ref 3.5–5.1)
Sodium: 137 mmol/L (ref 135–145)
Total Bilirubin: 1.1 mg/dL (ref 0.3–1.2)
Total Protein: 5.3 g/dL — ABNORMAL LOW (ref 6.5–8.1)

## 2019-11-11 LAB — C-REACTIVE PROTEIN: CRP: 0.6 mg/dL (ref <1.0)

## 2019-11-11 LAB — FERRITIN: Ferritin: 356 ng/mL — ABNORMAL HIGH (ref 24–336)

## 2019-11-11 LAB — D-DIMER, QUANTITATIVE: D-Dimer, Quant: 0.89 ug/mL-FEU — ABNORMAL HIGH (ref 0.00–0.50)

## 2019-11-11 MED ORDER — PREDNISONE 10 MG PO TABS: ORAL_TABLET | ORAL | 0 refills | Status: DC

## 2019-11-11 MED ORDER — FLUTICASONE PROPIONATE 50 MCG/ACT NA SUSP: 2.0000 | Freq: Every day | NASAL | 0 refills | Status: AC

## 2019-11-11 MED ORDER — ENSURE ENLIVE PO LIQD: 237.0000 mL | Freq: Two times a day (BID) | ORAL | 0 refills | Status: AC

## 2019-11-11 MED ORDER — ALBUTEROL SULFATE HFA 108 (90 BASE) MCG/ACT IN AERS: 2.0000 | INHALATION_SPRAY | RESPIRATORY_TRACT | 0 refills | Status: DC | PRN

## 2019-11-11 NOTE — Progress Notes (Signed)
NURSING PROGRESS NOTE  BRANSTON FLINT [id number] Discharge Data: 11/11/2019 5:26 PM Attending Provider: Jonetta Osgood, MD PCP:Kim, Jeneen Rinks, MD     [name] to be D/C'd Home with [facility] per MD order.  Discussed with the patient the After Visit Summary and all questions fully answered. All IV's discontinued with no bleeding noted. All belongings returned to patient for patient to take home.   Last Vital Signs:  Blood pressure (!) 148/66, pulse 68, temperature 98.3 F (36.8 C), temperature source Oral, resp. rate (!) 22, height 6\' 1"  (1.854 m), weight 101.4 kg, SpO2 94 %.  Discharge Medication List Allergies as of 11/11/2019   No Known Allergies     Medication List    STOP taking these medications   amoxicillin-clavulanate 875-125 MG tablet Commonly known as: AUGMENTIN   azithromycin 250 MG tablet Commonly known as: ZITHROMAX     TAKE these medications   albuterol 108 (90 Base) MCG/ACT inhaler Commonly known as: VENTOLIN HFA Inhale 2 puffs into the lungs every 2 (two) hours as needed for wheezing or shortness of breath.   amLODipine 5 MG tablet Commonly known as: NORVASC Take 1 tablet (5 mg total) by mouth daily.   aspirin EC 81 MG tablet Take 81 mg by mouth daily.   atorvastatin 20 MG tablet Commonly known as: LIPITOR Take 1 tablet (20 mg total) by mouth daily.   benzonatate 100 MG capsule Commonly known as: TESSALON Take 100 mg by mouth 3 (three) times daily as needed for cough.   clopidogrel 75 MG tablet Commonly known as: PLAVIX Take 1 tablet (75 mg total) by mouth daily with breakfast.   dextromethorphan-guaiFENesin 30-600 MG 12hr tablet Commonly known as: MUCINEX DM Take 1 tablet by mouth 2 (two) times daily.   feeding supplement (ENSURE ENLIVE) Liqd Take 237 mLs by mouth 2 (two) times daily between meals.   fluticasone 50 MCG/ACT nasal spray Commonly known as: FLONASE Place 2 sprays into both nostrils daily. Start taking on: November 12, 2019    guaiFENesin-codeine 100-10 MG/5ML syrup Commonly known as: ROBITUSSIN AC Take 5 mLs by mouth every 4 (four) hours as needed for cough.   isosorbide mononitrate 30 MG 24 hr tablet Commonly known as: IMDUR Take 1 tablet (30 mg total) by mouth daily.   MAGNESIUM PO Take 1 tablet by mouth daily.   meclizine 25 MG tablet Commonly known as: ANTIVERT Take 25 mg by mouth 3 (three) times daily as needed for dizziness.   nitroGLYCERIN 0.4 MG SL tablet Commonly known as: NITROSTAT Place 0.4 mg under the tongue every 5 (five) minutes as needed for chest pain.   pantoprazole 40 MG tablet Commonly known as: PROTONIX TAKE 1 TABLET(40 MG) BY MOUTH DAILY What changed: See the new instructions.   pramipexole 0.5 MG tablet Commonly known as: MIRAPEX TAKE 1 TABLET BY MOUTH EVERY NIGHT AT BEDTIME FOR RESTLESS LEG. What changed: See the new instructions.   predniSONE 10 MG tablet Commonly known as: DELTASONE Take 40 mg daily for 1 day, 30 mg daily for 1 day, 20 mg daily for 1 days,10 mg daily for 1 day, then stop   terazosin 5 MG capsule Commonly known as: HYTRIN Take 5 mg by mouth at bedtime.   VITAMIN D3 PO Take 1 tablet by mouth daily.   ZINC PO Take 1 tablet by mouth daily.            Durable Medical Equipment  (From admission, onward)  Start     Ordered   11/11/19 0724  For home use only DME Walker rolling  Once    Question Answer Comment  Walker: With Corinne Wheels   Patient needs a walker to treat with the following condition Physical deconditioning      11/11/19 0724   11/10/19 1435  For home use only DME oxygen  Once    Question Answer Comment  Length of Need 6 Months   Mode or (Route) Nasal cannula   Liters per Minute 2   Frequency Continuous (stationary and portable oxygen unit needed)   Oxygen conserving device Yes   Oxygen delivery system Gas      11/10/19 1435

## 2019-11-11 NOTE — Discharge Instructions (Signed)
Person Under Monitoring Name: Cody Floyd  Location: [place] [id number]   Infection Prevention Recommendations for Individuals Confirmed to have, or Being Evaluated for, 2019 Novel Coronavirus (COVID-19) Infection Who Receive Care at Home  Individuals who are confirmed to have, or are being evaluated for, COVID-19 should follow the prevention steps below until a healthcare provider or local or state health department says they can return to normal activities.  Stay home except to get medical care You should restrict activities outside your home, except for getting medical care. Do not go to work, school, or public areas, and do not use public transportation or taxis.  Call ahead before visiting your doctor Before your medical appointment, call the healthcare provider and tell them that you have, or are being evaluated for, COVID-19 infection. This will help the healthcare provider's office take steps to keep other people from getting infected. Ask your healthcare provider to call the local or state health department.  Monitor your symptoms Seek prompt medical attention if your illness is worsening (e.g., difficulty breathing). Before going to your medical appointment, call the healthcare provider and tell them that you have, or are being evaluated for, COVID-19 infection. Ask your healthcare provider to call the local or state health department.  Wear a facemask You should wear a facemask that covers your nose and mouth when you are in the same room with other people and when you visit a healthcare provider. People who live with or visit you should also wear a facemask while they are in the same room with you.  Separate yourself from other people in your home As much as possible, you should stay in a different room from other people in your home. Also, you should use a separate bathroom, if available.  Avoid sharing household items You should not  share dishes, drinking glasses, cups, eating utensils, towels, bedding, or other items with other people in your home. After using these items, you should wash them thoroughly with soap and water.  Cover your coughs and sneezes Cover your mouth and nose with a tissue when you cough or sneeze, or you can cough or sneeze into your sleeve. Throw used tissues in a lined trash can, and immediately wash your hands with soap and water for at least 20 seconds or use an alcohol-based hand rub.  Wash your [facility] your hands often and thoroughly with soap and water for at least 20 seconds. You can use an alcohol-based hand sanitizer if soap and water are not available and if your hands are not visibly dirty. Avoid touching your eyes, nose, and mouth with unwashed hands.   Prevention Steps for Caregivers and Household Members of Individuals Confirmed to have, or Being Evaluated for, COVID-19 Infection Being Cared for in the Home  If you live with, or provide care at home for, a person confirmed to have, or being evaluated for, COVID-19 infection please follow these guidelines to prevent infection:  Follow healthcare provider's instructions Make sure that you understand and can help the patient follow any healthcare provider instructions for all care.  Provide for the patient's basic needs You should help the patient with basic needs in the home and provide support for getting groceries, prescriptions, and other personal needs.  Monitor the patient's symptoms If they are getting sicker, call his or her medical provider and tell them that the patient has, or is being evaluated for, COVID-19 infection. This will help the healthcare provider's  office take steps to keep other people from getting infected. Ask the healthcare provider to call the local or state health department.  Limit the number of people who have contact with the patient  If possible, have only one caregiver for the  patient.  Other household members should stay in another home or place of residence. If this is not possible, they should stay  in another room, or be separated from the patient as much as possible. Use a separate bathroom, if available.  Restrict visitors who do not have an essential need to be in the home.  Keep older adults, very young children, and other sick people away from the patient Keep older adults, very young children, and those who have compromised immune systems or chronic health conditions away from the patient. This includes people with chronic heart, lung, or kidney conditions, diabetes, and cancer.  Ensure good ventilation Make sure that shared spaces in the home have good air flow, such as from an air conditioner or an opened window, weather permitting.  Wash your hands often  Wash your hands often and thoroughly with soap and water for at least 20 seconds. You can use an alcohol based hand sanitizer if soap and water are not available and if your hands are not visibly dirty.  Avoid touching your eyes, nose, and mouth with unwashed hands.  Use disposable paper towels to dry your hands. If not available, use dedicated cloth towels and replace them when they become wet.  Wear a facemask and gloves  Wear a disposable facemask at all times in the room and gloves when you touch or have contact with the patient's blood, body fluids, and/or secretions or excretions, such as sweat, saliva, sputum, nasal mucus, vomit, urine, or feces.  Ensure the mask fits over your nose and mouth tightly, and do not touch it during use.  Throw out disposable facemasks and gloves after using them. Do not reuse.  Wash your hands immediately after removing your facemask and gloves.  If your personal clothing becomes contaminated, carefully remove clothing and launder. Wash your hands after handling contaminated clothing.  Place all used disposable facemasks, gloves, and other waste in a lined  container before disposing them with other household waste.  Remove gloves and wash your hands immediately after handling these items.  Do not share dishes, glasses, or other household items with the patient  Avoid sharing household items. You should not share dishes, drinking glasses, cups, eating utensils, towels, bedding, or other items with a patient who is confirmed to have, or being evaluated for, COVID-19 infection.  After the person uses these items, you should wash them thoroughly with soap and water.  Wash laundry thoroughly  Immediately remove and wash clothes or bedding that have blood, body fluids, and/or secretions or excretions, such as sweat, saliva, sputum, nasal mucus, vomit, urine, or feces, on them.  Wear gloves when handling laundry from the patient.  Read and follow directions on labels of laundry or clothing items and detergent. In general, wash and dry with the warmest temperatures recommended on the label.  Clean all areas the individual has used often  Clean all touchable surfaces, such as counters, tabletops, doorknobs, bathroom fixtures, toilets, phones, keyboards, tablets, and bedside tables, every day. Also, clean any surfaces that may have blood, body fluids, and/or secretions or excretions on them.  Wear gloves when cleaning surfaces the patient has come in contact with.  Use a diluted bleach solution (e.g., dilute bleach with 1  part bleach and 10 parts water) or a household disinfectant with a label that says EPA-registered for coronaviruses. To make a bleach solution at home, add 1 tablespoon of bleach to 1 quart (4 cups) of water. For a larger supply, add  cup of bleach to 1 gallon (16 cups) of water.  Read labels of cleaning products and follow recommendations provided on product labels. Labels contain instructions for safe and effective use of the cleaning product including precautions you should take when applying the product, such as wearing gloves or  eye protection and making sure you have good ventilation during use of the product.  Remove gloves and wash hands immediately after cleaning.  Monitor yourself for signs and symptoms of illness Caregivers and household members are considered close contacts, should monitor their health, and will be asked to limit movement outside of the home to the extent possible. Follow the monitoring steps for close contacts listed on the symptom monitoring form.   ? If you have additional questions, contact your local health department or call the epidemiologist on call at [phone] (available 24/7). ? This guidance is subject to change. For the most up-to-date guidance from [facility], please refer to their website: [email or web address]

## 2019-11-11 NOTE — Discharge Summary (Signed)
PATIENT DETAILS Name: Cody Floyd Age: 79 y.o. Sex: male Date of Birth: April 03, 1941 MRN: [id number]. Admitting Physician: Karmen Bongo, MD PCP:Kim, Jeneen Rinks, MD  Admit Date: 11/06/2019 Discharge date: 11/11/2019  Recommendations for Outpatient Follow-up:  1. Follow up with PCP in 1-2 weeks 2. Please obtain CMP/CBC in one week 3. Repeat Chest Xray in 4-6 week 4. Reassess home O2 requirement-on next follow-up with PCP.   Admitted From:  Home  Disposition: Home with home health services   Home Health: Yes  Equipment/Devices: oxygen 2L  Discharge Condition: Stable  CODE STATUS: FULL CODE  Diet recommendation:  Diet Order            Diet - low sodium heart healthy        DIET DYS 3 Room service appropriate? Yes; Fluid consistency: Thin  Diet effective now               Brief Narrative: Patient is a 79 y.o. male with PMHx of CAD s/p PCI 2019, HTN, HLD who presented with cough and shortness of breath-was found to have acute hypoxic respiratory failure secondary to COVID-19 pneumonia  Significant Events: 3/14>> admit to [facility] for hypoxia secondary to Covid 19 PNA  Microbiology data: None  COVID-19 medications Remdesivir: 3/14>>3/18 Steroids: 3/14>>  Procedures: None  Consults: None  Brief Hospital Course: Acute Hypoxic Resp Failure due to Covid 19 Viral pneumonia: Improved significantly by the day of discharge-feels better-coughing spells have decreased.  CRP is normalized.  Has completed a course of remdesivir on 3/18, will be discharged on a few more days of tapering steroids.  Initially reluctant to get home health-but has now consented-will be discharged home with home health services.  He will also require oxygen on discharge-his PCP will need to assess whether he still requires oxygen at next visit.   COVID-19 Labs:  Recent Labs    11/09/19 0225 11/10/19 0616 11/11/19 0214  DDIMER 0.85* 0.92* 0.89*  FERRITIN 303 311 356*  CRP 0.7 0.5 0.6     Lab Results  Component Value Date   SARSCOV2NAA NOT DETECTED 05/20/2019     CAD s/p PCI: No anginal symptoms-continue aspirin/Plavix/Lipitor/Imdur  HTN: Controlled-continue Norvasc, Imdur and Hytrin  HLD: Continue Lipitor  Restless leg syndrome: Stable with Mirapex  Nutrition Problem: Nutrition Problem: Increased nutrient needs Etiology: acute illness(COVID) Signs/Symptoms: estimated needs Interventions: Ensure Enlive (each supplement provides 350kcal and 20 grams of protein)   Discharge Diagnoses:  Principal Problem:   Acute respiratory disease due to COVID-19 virus Active Problems:   Spinal stenosis, lumbar region, with neurogenic claudication   Peripheral arterial disease (HCC)   Restless legs syndrome   Essential hypertension   Dyslipidemia   CAD (coronary artery disease), native coronary artery   Discharge Instructions:    Person Under Monitoring Name: Cody Floyd  Location: [address] [place] [address]   Infection Prevention Recommendations for Individuals Confirmed to have, or Being Evaluated for, 2019 Novel Coronavirus (COVID-19) Infection Who Receive Care at Home  Individuals who are confirmed to have, or are being evaluated for, COVID-19 should follow the prevention steps below until a healthcare provider or local or state health department says they can return to normal activities.  Stay home except to get medical care You should restrict activities outside your home, except for getting medical care. Do not go to work, school, or public areas, and do not use public transportation or taxis.  Call ahead before visiting your doctor Before your medical appointment,  call the healthcare provider and tell them that you have, or are being evaluated for, COVID-19 infection. This will help the healthcare provider's office take steps to keep other people from getting infected. Ask your healthcare provider to call the local or state  health department.  Monitor your symptoms Seek prompt medical attention if your illness is worsening (e.g., difficulty breathing). Before going to your medical appointment, call the healthcare provider and tell them that you have, or are being evaluated for, COVID-19 infection. Ask your healthcare provider to call the local or state health department.  Wear a facemask You should wear a facemask that covers your nose and mouth when you are in the same room with other people and when you visit a healthcare provider. People who live with or visit you should also wear a facemask while they are in the same room with you.  Separate yourself from other people in your home As much as possible, you should stay in a different room from other people in your home. Also, you should use a separate bathroom, if available.  Avoid sharing household items You should not share dishes, drinking glasses, cups, eating utensils, towels, bedding, or other items with other people in your home. After using these items, you should wash them thoroughly with soap and water.  Cover your coughs and sneezes Cover your mouth and nose with a tissue when you cough or sneeze, or you can cough or sneeze into your sleeve. Throw used tissues in a lined trash can, and immediately wash your hands with soap and water for at least 20 seconds or use an alcohol-based hand rub.  Wash your [facility] your hands often and thoroughly with soap and water for at least 20 seconds. You can use an alcohol-based hand sanitizer if soap and water are not available and if your hands are not visibly dirty. Avoid touching your eyes, nose, and mouth with unwashed hands.   Prevention Steps for Caregivers and Household Members of Individuals Confirmed to have, or Being Evaluated for, COVID-19 Infection Being Cared for in the Home  If you live with, or provide care at home for, a person confirmed to have, or being evaluated for, COVID-19  infection please follow these guidelines to prevent infection:  Follow healthcare provider's instructions Make sure that you understand and can help the patient follow any healthcare provider instructions for all care.  Provide for the patient's basic needs You should help the patient with basic needs in the home and provide support for getting groceries, prescriptions, and other personal needs.  Monitor the patient's symptoms If they are getting sicker, call his or her medical provider and tell them that the patient has, or is being evaluated for, COVID-19 infection. This will help the healthcare provider's office take steps to keep other people from getting infected. Ask the healthcare provider to call the local or state health department.  Limit the number of people who have contact with the patient  If possible, have only one caregiver for the patient.  Other household members should stay in another home or place of residence. If this is not possible, they should stay  in another room, or be separated from the patient as much as possible. Use a separate bathroom, if available.  Restrict visitors who do not have an essential need to be in the home.  Keep older adults, very young children, and other sick people away from the patient Keep older adults, very young children, and those who have  compromised immune systems or chronic health conditions away from the patient. This includes people with chronic heart, lung, or kidney conditions, diabetes, and cancer.  Ensure good ventilation Make sure that shared spaces in the home have good air flow, such as from an air conditioner or an opened window, weather permitting.  Wash your hands often  Wash your hands often and thoroughly with soap and water for at least 20 seconds. You can use an alcohol based hand sanitizer if soap and water are not available and if your hands are not visibly dirty.  Avoid touching your eyes, nose, and mouth  with unwashed hands.  Use disposable paper towels to dry your hands. If not available, use dedicated cloth towels and replace them when they become wet.  Wear a facemask and gloves  Wear a disposable facemask at all times in the room and gloves when you touch or have contact with the patient's blood, body fluids, and/or secretions or excretions, such as sweat, saliva, sputum, nasal mucus, vomit, urine, or feces.  Ensure the mask fits over your nose and mouth tightly, and do not touch it during use.  Throw out disposable facemasks and gloves after using them. Do not reuse.  Wash your hands immediately after removing your facemask and gloves.  If your personal clothing becomes contaminated, carefully remove clothing and launder. Wash your hands after handling contaminated clothing.  Place all used disposable facemasks, gloves, and other waste in a lined container before disposing them with other household waste.  Remove gloves and wash your hands immediately after handling these items.  Do not share dishes, glasses, or other household items with the patient  Avoid sharing household items. You should not share dishes, drinking glasses, cups, eating utensils, towels, bedding, or other items with a patient who is confirmed to have, or being evaluated for, COVID-19 infection.  After the person uses these items, you should wash them thoroughly with soap and water.  Wash laundry thoroughly  Immediately remove and wash clothes or bedding that have blood, body fluids, and/or secretions or excretions, such as sweat, saliva, sputum, nasal mucus, vomit, urine, or feces, on them.  Wear gloves when handling laundry from the patient.  Read and follow directions on labels of laundry or clothing items and detergent. In general, wash and dry with the warmest temperatures recommended on the label.  Clean all areas the individual has used often  Clean all touchable surfaces, such as counters, tabletops,  doorknobs, bathroom fixtures, toilets, phones, keyboards, tablets, and bedside tables, every day. Also, clean any surfaces that may have blood, body fluids, and/or secretions or excretions on them.  Wear gloves when cleaning surfaces the patient has come in contact with.  Use a diluted bleach solution (e.g., dilute bleach with 1 part bleach and 10 parts water) or a household disinfectant with a label that says EPA-registered for coronaviruses. To make a bleach solution at home, add 1 tablespoon of bleach to 1 quart (4 cups) of water. For a larger supply, add  cup of bleach to 1 gallon (16 cups) of water.  Read labels of cleaning products and follow recommendations provided on product labels. Labels contain instructions for safe and effective use of the cleaning product including precautions you should take when applying the product, such as wearing gloves or eye protection and making sure you have good ventilation during use of the product.  Remove gloves and wash hands immediately after cleaning.  Monitor yourself for signs and symptoms of illness Caregivers  and household members are considered close contacts, should monitor their health, and will be asked to limit movement outside of the home to the extent possible. Follow the monitoring steps for close contacts listed on the symptom monitoring form.   ? If you have additional questions, contact your local health department or call the epidemiologist on call at [phone] (available 24/7). ? This guidance is subject to change. For the most up-to-date guidance from CDC, please refer to their website: YouBlogs.pl    Activity:  As tolerated with Full fall precautions use walker/cane & assistance as needed   Discharge Instructions    Call MD for:  difficulty breathing, headache or visual disturbances   Complete by: As directed    Call MD for:  persistant dizziness or  light-headedness   Complete by: As directed    Call MD for:  persistant nausea and vomiting   Complete by: As directed    Call MD for:  severe uncontrolled pain   Complete by: As directed    Diet - low sodium heart healthy   Complete by: As directed    Discharge instructions   Complete by: As directed    1.)  3 weeks of isolation from the day of your first positive Covid test.   Follow with Primary MD  Jani Gravel, MD in 1-2 weeks  Please get a complete blood count and chemistry panel checked by your Primary MD at your next visit, and again as instructed by your Primary MD.  Get Medicines reviewed and adjusted: Please take all your medications with you for your next visit with your Primary MD  Laboratory/radiological data: Please request your Primary MD to go over all hospital tests and procedure/radiological results at the follow up, please ask your Primary MD to get all Hospital records sent to his/her office.  In some cases, they will be blood work, cultures and biopsy results pending at the time of your discharge. Please request that your primary care M.D. follows up on these results.  Also Note the following: If you experience worsening of your admission symptoms, develop shortness of breath, life threatening emergency, suicidal or homicidal thoughts you must seek medical attention immediately by calling 911 or calling your MD immediately  if symptoms less severe.  You must read complete instructions/literature along with all the possible adverse reactions/side effects for all the Medicines you take and that have been prescribed to you. Take any new Medicines after you have completely understood and accpet all the possible adverse reactions/side effects.   Do not drive when taking Pain medications or sleeping medications (Benzodaizepines)  Do not take more than prescribed Pain, Sleep and Anxiety Medications. It is not advisable to combine anxiety,sleep and pain medications without  talking with your primary care practitioner  Special Instructions: If you have smoked or chewed Tobacco  in the last 2 yrs please stop smoking, stop any regular Alcohol  and or any Recreational drug use.  Wear Seat belts while driving.  Please note: You were cared for by a hospitalist during your hospital stay. Once you are discharged, your primary care physician will handle any further medical issues. Please note that NO REFILLS for any discharge medications will be authorized once you are discharged, as it is imperative that you return to your primary care physician (or establish a relationship with a primary care physician if you do not have one) for your post hospital discharge needs so that they can reassess your need for medications and monitor your  lab values.   Increase activity slowly   Complete by: As directed      Allergies as of 11/11/2019   No Known Allergies     Medication List    STOP taking these medications   amoxicillin-clavulanate 875-125 MG tablet Commonly known as: AUGMENTIN   azithromycin 250 MG tablet Commonly known as: ZITHROMAX     TAKE these medications   albuterol 108 (90 Base) MCG/ACT inhaler Commonly known as: VENTOLIN HFA Inhale 2 puffs into the lungs every 2 (two) hours as needed for wheezing or shortness of breath.   amLODipine 5 MG tablet Commonly known as: NORVASC Take 1 tablet (5 mg total) by mouth daily.   aspirin EC 81 MG tablet Take 81 mg by mouth daily.   atorvastatin 20 MG tablet Commonly known as: LIPITOR Take 1 tablet (20 mg total) by mouth daily.   benzonatate 100 MG capsule Commonly known as: TESSALON Take 100 mg by mouth 3 (three) times daily as needed for cough.   clopidogrel 75 MG tablet Commonly known as: PLAVIX Take 1 tablet (75 mg total) by mouth daily with breakfast.   dextromethorphan-guaiFENesin 30-600 MG 12hr tablet Commonly known as: MUCINEX DM Take 1 tablet by mouth 2 (two) times daily.   feeding supplement  (ENSURE ENLIVE) Liqd Take 237 mLs by mouth 2 (two) times daily between meals.   fluticasone 50 MCG/ACT nasal spray Commonly known as: FLONASE Place 2 sprays into both nostrils daily. Start taking on: November 12, 2019   guaiFENesin-codeine 100-10 MG/5ML syrup Commonly known as: ROBITUSSIN AC Take 5 mLs by mouth every 4 (four) hours as needed for cough.   isosorbide mononitrate 30 MG 24 hr tablet Commonly known as: IMDUR Take 1 tablet (30 mg total) by mouth daily.   MAGNESIUM PO Take 1 tablet by mouth daily.   meclizine 25 MG tablet Commonly known as: ANTIVERT Take 25 mg by mouth 3 (three) times daily as needed for dizziness.   nitroGLYCERIN 0.4 MG SL tablet Commonly known as: NITROSTAT Place 0.4 mg under the tongue every 5 (five) minutes as needed for chest pain.   pantoprazole 40 MG tablet Commonly known as: PROTONIX TAKE 1 TABLET(40 MG) BY MOUTH DAILY What changed: See the new instructions.   pramipexole 0.5 MG tablet Commonly known as: MIRAPEX TAKE 1 TABLET BY MOUTH EVERY NIGHT AT BEDTIME FOR RESTLESS LEG. What changed: See the new instructions.   predniSONE 10 MG tablet Commonly known as: DELTASONE Take 40 mg daily for 1 day, 30 mg daily for 1 day, 20 mg daily for 1 days,10 mg daily for 1 day, then stop   terazosin 5 MG capsule Commonly known as: HYTRIN Take 5 mg by mouth at bedtime.   VITAMIN D3 PO Take 1 tablet by mouth daily.   ZINC PO Take 1 tablet by mouth daily.            Durable Medical Equipment  (From admission, onward)         Start     Ordered   11/11/19 0724  For home use only DME Walker rolling  Once    Question Answer Comment  Walker: With 5 Inch Wheels   Patient needs a walker to treat with the following condition Physical deconditioning      11/11/19 0724   11/10/19 1435  For home use only DME oxygen  Once    Question Answer Comment  Length of Need 6 Months   Mode or (Route) Nasal cannula  Liters per Minute 2   Frequency  Continuous (stationary and portable oxygen unit needed)   Oxygen conserving device Yes   Oxygen delivery system Gas      11/10/19 1435         Follow-up Information    Jani Gravel, MD. Schedule an appointment as soon as possible for a visit in 1 week(s).   Specialty: Internal Medicine Contact information: [place] [address] [phone]          No Known Allergies    Other Procedures/Studies: DG Chest Port 1 View  Result Date: 11/06/2019 CLINICAL DATA:  Cough. COVID positive. EXAM: PORTABLE CHEST 1 VIEW COMPARISON:  03/20/2017 chest radiograph FINDINGS: UPPER limits normal heart size noted. Patchy RIGHT LOWER lung opacity noted likely representing infection/pneumonia. No pulmonary mass, pleural effusion or pneumothorax. No acute bony abnormalities are present. IMPRESSION: Patchy RIGHT LOWER lung opacity likely representing infection/pneumonia. Electronically Signed   By: Margarette Canada M.D.   On: 11/06/2019 14:26     TODAY-DAY OF DISCHARGE:  Subjective:   Cody Floyd today has no headache,no chest abdominal pain,no new weakness tingling or numbness, feels much better wants to go home today.   Objective:   Blood pressure (!) 148/66, pulse 68, temperature 98.3 F (36.8 C), temperature source Oral, resp. rate (!) 22, height 6\' 1"  (1.854 m), weight 101.4 kg, SpO2 94 %.  Intake/Output Summary (Last 24 hours) at 11/11/2019 1142 Last data filed at 11/11/2019 1029 Gross per 24 hour  Intake 357 ml  Output 2275 ml  Net -1918 ml   Filed Weights   11/06/19 1716  Weight: 101.4 kg    Exam: Awake Alert, Oriented *3, No new F.N deficits, Normal affect [place].AT,PERRAL Supple Neck,No JVD, No cervical lymphadenopathy appriciated.  Symmetrical Chest wall movement, Good air movement bilaterally, CTAB RRR,No Gallops,Rubs or new Murmurs, No Parasternal Heave +ve B.Sounds, Abd Soft, Non tender, No organomegaly appriciated, No rebound -guarding or  rigidity. No Cyanosis, Clubbing or edema, No new Rash or bruise   PERTINENT RADIOLOGIC STUDIES: DG Chest Port 1 View  Result Date: 11/06/2019 CLINICAL DATA:  Cough. COVID positive. EXAM: PORTABLE CHEST 1 VIEW COMPARISON:  03/20/2017 chest radiograph FINDINGS: UPPER limits normal heart size noted. Patchy RIGHT LOWER lung opacity noted likely representing infection/pneumonia. No pulmonary mass, pleural effusion or pneumothorax. No acute bony abnormalities are present. IMPRESSION: Patchy RIGHT LOWER lung opacity likely representing infection/pneumonia. Electronically Signed   By: Margarette Canada M.D.   On: 11/06/2019 14:26     PERTINENT LAB RESULTS: CBC: Recent Labs    11/10/19 0616 11/11/19 0214  WBC 6.4 6.6  HGB 13.2 12.9*  HCT 38.9* 39.2  PLT 112* 113*   CMET CMP     Component Value Date/Time   NA 137 11/11/2019 0214   K 4.2 11/11/2019 0214   CL 101 11/11/2019 0214   CO2 26 11/11/2019 0214   GLUCOSE 163 (H) 11/11/2019 0214   BUN 28 (H) 11/11/2019 0214   CREATININE 0.96 11/11/2019 0214   CALCIUM 8.2 (L) 11/11/2019 0214   PROT 5.3 (L) 11/11/2019 0214   PROT 6.6 07/30/2018 0927   ALBUMIN 3.0 (L) 11/11/2019 0214   AST 57 (H) 11/11/2019 0214   ALT 71 (H) 11/11/2019 0214   ALKPHOS 50 11/11/2019 0214   BILITOT 1.1 11/11/2019 0214   GFRNONAA >60 11/11/2019 0214   GFRAA >60 11/11/2019 0214    GFR Estimated Creatinine Clearance: 78.1 mL/min (by C-G formula based on SCr of 0.96  mg/dL). No results for input(s): LIPASE, AMYLASE in the last 72 hours. No results for input(s): CKTOTAL, CKMB, CKMBINDEX, TROPONINI in the last 72 hours. Invalid input(s): POCBNP Recent Labs    11/10/19 0616 11/11/19 0214  DDIMER 0.92* 0.89*   No results for input(s): HGBA1C in the last 72 hours. No results for input(s): CHOL, HDL, LDLCALC, TRIG, CHOLHDL, LDLDIRECT in the last 72 hours. No results for input(s): TSH, T4TOTAL, T3FREE, THYROIDAB in the last 72 hours.  Invalid input(s): FREET3 Recent  Labs    11/10/19 0616 11/11/19 0214  FERRITIN 311 356*   Coags: No results for input(s): INR in the last 72 hours.  Invalid input(s): PT Microbiology: No results found for this or any previous visit (from the past 240 hour(s)).  FURTHER DISCHARGE INSTRUCTIONS:  Get Medicines reviewed and adjusted: Please take all your medications with you for your next visit with your Primary MD  Laboratory/radiological data: Please request your Primary MD to go over all hospital tests and procedure/radiological results at the follow up, please ask your Primary MD to get all Hospital records sent to his/her office.  In some cases, they will be blood work, cultures and biopsy results pending at the time of your discharge. Please request that your primary care M.D. goes through all the records of your hospital data and follows up on these results.  Also Note the following: If you experience worsening of your admission symptoms, develop shortness of breath, life threatening emergency, suicidal or homicidal thoughts you must seek medical attention immediately by calling 911 or calling your MD immediately  if symptoms less severe.  You must read complete instructions/literature along with all the possible adverse reactions/side effects for all the Medicines you take and that have been prescribed to you. Take any new Medicines after you have completely understood and accpet all the possible adverse reactions/side effects.   Do not drive when taking Pain medications or sleeping medications (Benzodaizepines)  Do not take more than prescribed Pain, Sleep and Anxiety Medications. It is not advisable to combine anxiety,sleep and pain medications without talking with your primary care practitioner  Special Instructions: If you have smoked or chewed Tobacco  in the last 2 yrs please stop smoking, stop any regular Alcohol  and or any Recreational drug use.  Wear Seat belts while driving.  Please note: You were  cared for by a hospitalist during your hospital stay. Once you are discharged, your primary care physician will handle any further medical issues. Please note that NO REFILLS for any discharge medications will be authorized once you are discharged, as it is imperative that you return to your primary care physician (or establish a relationship with a primary care physician if you do not have one) for your post hospital discharge needs so that they can reassess your need for medications and monitor your lab values.  Total Time spent coordinating discharge including counseling, education and face to face time equals 45 minutes.  SignedOren Binet 11/11/2019 11:42 AM

## 2019-11-11 NOTE — Plan of Care (Signed)
  Problem: Activity: Goal: Risk for activity intolerance will decrease Outcome: Adequate for Discharge   Problem: Nutrition: Goal: Adequate nutrition will be maintained Outcome: Adequate for Discharge

## 2019-11-11 NOTE — Care Management Important Message (Signed)
Important Message  Patient Details  Name: Cody Floyd MRN: [id number] Date of Birth: [birth year]   Medicare Important Message Given:  Yes - Important Message mailed due to current National Emergency  Verbal consent obtained due to current National Emergency  Relationship to patient: Spouse/Significant Other Contact Name: Johnnette Barrios Call Date: 11/11/19  Time: 1404 Phone: [phone] Outcome: Spoke with contact Important Message mailed to: Patient address on file    Delorse Lek 11/11/2019, 2:05 PM

## 2019-11-11 NOTE — Progress Notes (Signed)
Pt placed on room air this morning with O2sats running in the 90s. Will continue to monitor pt to assess need for home oxygen at discharge.

## 2019-11-11 NOTE — Progress Notes (Signed)
Occupational Therapy Treatment Patient Details Name: Cody Floyd MRN: [id number] DOB: 1941/07/15 Today's Date: 11/11/2019    History of present illness 79 year old male who presented with cough and shortness of breath and found to have acute hypoxic respiratory failure secondary to COVID-19 PNA. He was admitted 11/06/19 and REmdesivir and steroids started. CRP downtrending. PMH: CAD s/p PCI 2019, HTN, HLD   OT comments  Pt progressing towards goals. Pt received in recliner chair on RA at 90-91%. Pt able to Independently pull up socks on B feet, but cued for orientation of grips on socks for safety. Pt Independently able to wipe face, intermittent coughing noted.  Pt denied need for toileting task or other ADLs during session. Pt Supervision for sit to stand, min guard for initial mobility in room due to unsteadiness, progressing to Supervision with RW. On second bout of mobility in room with RW, O2 stats dropped to 86% briefly, but quickly recovered (<5 seconds) to 89-90% on RA. Provided education on optimal O2 levels, reason for SOB on exertion per pt inquiries.    Follow Up Recommendations  Home health OT;Supervision - Intermittent    Equipment Recommendations  None recommended by OT    Recommendations for Other Services      Precautions / Restrictions Precautions Precautions: Fall;Other (comment) Precaution Comments: monitor oxygen saturation Restrictions Weight Bearing Restrictions: No       Mobility Bed Mobility               General bed mobility comments: Patient already OOB in chair  Transfers Overall transfer level: Needs assistance Equipment used: Rolling walker (2 wheeled) Transfers: Sit to/from Stand;Stand Pivot Transfers Sit to Stand: Supervision Stand pivot transfers: Supervision            Balance Overall balance assessment: Needs assistance Sitting-balance support: Feet supported Sitting balance-Leahy Scale: Good     Standing balance support:  Bilateral upper extremity supported Standing balance-Leahy Scale: Fair                             ADL either performed or assessed with clinical judgement   ADL       Grooming: Independent;Wash/dry face;Sitting               Lower Body Dressing: Independent;Sitting/lateral leans;Supervision/safety Lower Body Dressing Details (indicate cue type and reason): Independently pulled up socks seated in recliner, but required cues to ensure grips on correct side for safety             Functional mobility during ADLs: Min guard;Supervision/safety;Rolling walker General ADL Comments: Min guard initially for mobility in room with RW due to slight unsteadiness, but progressed to supervision with RW on RA     Vision       Perception     Praxis      Cognition Arousal/Alertness: Awake/alert Behavior During Therapy: WFL for tasks assessed/performed Overall Cognitive Status: Within Functional Limits for tasks assessed                                          Exercises Other Exercises Other Exercises: flutter x 5 Other Exercises: incentive spirometer x 5, max 2486mL(education on improved technique with teachback)   Shoulder Instructions       General Comments Pt received on RA at 90-91%. During first bout of mobility within room, pt  O2 WFL, on second bout, O2 dropped to 86% on RA, but quickly recovered to 89-90% on RA.     Pertinent Vitals/ Pain       Pain Assessment: No/denies pain  Home Living                                          Prior Functioning/Environment              Frequency  Min 3X/week        Progress Toward Goals  OT Goals(current goals can now be found in the care plan section)  Progress towards OT goals: Progressing toward goals  Acute Rehab OT Goals Patient Stated Goal: get better, get rid of cough OT Goal Formulation: With patient Time For Goal Achievement: 11/23/19 Potential to Achieve  Goals: Good ADL Goals Pt Will Perform Grooming: with modified independence;standing Pt Will Perform Lower Body Bathing: with modified independence;sit to/from stand Pt Will Perform Lower Body Dressing: with modified independence;sit to/from stand Pt Will Transfer to Toilet: with modified independence;ambulating;regular height toilet Pt Will Perform Toileting - Clothing Manipulation and hygiene: with modified independence;sit to/from stand;sitting/lateral leans Additional ADL Goal #1: Pt will verbalize at least 3 energy conservation strategies in order to maximize ADL/IADL independence.  Plan Discharge plan remains appropriate    Co-evaluation                 AM-PAC OT "6 Clicks" Daily Activity     Outcome Measure   Help from another person eating meals?: None Help from another person taking care of personal grooming?: None Help from another person toileting, which includes using toliet, bedpan, or urinal?: A Little Help from another person bathing (including washing, rinsing, drying)?: A Little Help from another person to put on and taking off regular upper body clothing?: A Little Help from another person to put on and taking off regular lower body clothing?: A Little 6 Click Score: 20    End of Session Equipment Utilized During Treatment: Gait belt;Rolling walker  OT Visit Diagnosis: Unsteadiness on feet (R26.81);Other abnormalities of gait and mobility (R26.89)   Activity Tolerance Patient tolerated treatment well   Patient Left in chair;with call bell/phone within reach   Nurse Communication Mobility status;Other (comment)(O2 stats)        Time: [id number] OT Time Calculation (min): 22 min  Charges: OT General Charges $OT Visit: 1 Visit OT Treatments $Therapeutic Activity: 8-22 mins  Layla Maw, OTR/L   Layla Maw 11/11/2019, 1:10 PM

## 2019-11-11 NOTE — Progress Notes (Addendum)
Physical Therapy Treatment Note  Patient with improved mobility today using RW. On room air he desatted to 86% during gait but not sustained, 88% post ambulation but recovers to >/=90% with seated rest. Oxygen saturation grossly 88-92% on room air during session. Education provided on energy conservation strategies and pacing techniques.  Continued recommendation for home PT, use of RW and supervision for mobility.    11/11/19 1211  PT Visit Information  Last PT Received On 11/11/19  Assistance Needed +1  History of Present Illness 79 year old male who presented with cough and shortness of breath and found to have acute hypoxic respiratory failure secondary to COVID-19 PNA. He was admitted 11/06/19 and REmdesivir and steroids started. CRP downtrending. PMH: CAD s/p PCI 2019, HTN, HLD  Subjective Data  Subjective discharge home planned for today  Precautions  Precautions Fall;Other (comment)  Precaution Comments monitor oxygen saturation, dysphagia 3 with thin liquids  Restrictions  Weight Bearing Restrictions No  Pain Assessment  Pain Assessment No/denies pain (No complaints of pain, no signs/symptoms of pain)  Cognition  Arousal/Alertness Awake/alert  Behavior During Therapy WFL for tasks assessed/performed  Bed Mobility  General bed mobility comments Already OOB in chair  Transfers  Overall transfer level Needs assistance  Equipment used Rolling walker (2 wheeled)  Transfers Sit to/from Stand  Sit to Stand Supervision;Modified independent (Device/Increase time)  Ambulation/Gait  Ambulation/Gait assistance Supervision;Modified independent (Device/Increase time)  Gait Distance (Feet) 70 Feet  Assistive device Rolling walker (2 wheeled)  Gait Pattern/deviations Decreased step length - right;Decreased step length - left;Step-through pattern  General Gait Details On room air, oxygen saturation down to 86% during gait but not sustained, 88% post ambulation but recovers to >/=90% with  seated rest. Patient now says that he has two standard walkers and one walker with wheels. Education on recommendation for use of RW for energy conservation and improved mobility. Patient verbalized understanding.  Gait velocity decreased  Balance  Overall balance assessment Needs assistance  Standing balance support Bilateral upper extremity supported  Standing balance-Leahy Scale  (Good with use of RW)  General Comments  General comments (skin integrity, edema, etc.) Patient now accepting of home health services.  Other Exercises  Other Exercises flutter x 5  Other Exercises incentive spirometer x 5, max 2422mL (education on improved technique with teachback)  PT - End of Session  Activity Tolerance Patient tolerated treatment well  Patient left in chair;with call bell/phone within reach   PT - Assessment/Plan  PT Plan Current plan remains appropriate  PT Visit Diagnosis Unsteadiness on feet (R26.81);Other abnormalities of gait and mobility (R26.89)  PT Frequency (ACUTE ONLY) Min 3X/week  Follow Up Recommendations Home health PT;Supervision for mobility/OOB  PT equipment Rolling walker with 5" wheels (pt states he owns a RW)  AM-PAC PT "6 Clicks" Mobility Outcome Measure (Version 2)  Help needed turning from your back to your side while in a flat bed without using bedrails? 4  Help needed moving from lying on your back to sitting on the side of a flat bed without using bedrails? 4  Help needed moving to and from a bed to a chair (including a wheelchair)? 3  Help needed standing up from a chair using your arms (e.g., wheelchair or bedside chair)? 4  Help needed to walk in hospital room? 3  Help needed climbing 3-5 steps with a railing?  3  6 Click Score 21  Consider Recommendation of Discharge To: Home with no services  PT Goal Progression  Progress towards PT goals Progressing toward goals  PT Time Calculation  PT Start Time (ACUTE ONLY) 1211  PT Stop Time (ACUTE ONLY) 1228  PT  Time Calculation (min) (ACUTE ONLY) 17 min  PT General Charges  $$ ACUTE PT VISIT 1 Visit  PT Treatments  $Gait Training 8-22 mins   Birdie Hopes, PT, DPT Acute Rehab [phone] office

## 2019-11-11 NOTE — Consult Note (Signed)
   [facility] CM Inpatient Consult   11/11/2019  FREDERICO VENEMAN 26-Jul-1941 [id number]   Follow up:  Spoke with patient via hospital phone regarding post hospital follow up needs. HIPAA verified.  [place] involvement with [employer] and his primary care provider.  Patient confirms that Dr. Jani Gravel is his primary provider, [facility], this office is listed to provide the transition of care follow up.    Encouraged patient to reach out regarding his primary care regarding 2nd vaccine timeframe for next vaccine scheduled 11/15/2019 may be too soon. Reached out to attending to assess for need to postpone current vaccine schedule.  Attempts to contact by phone again however line ringing busy x 3. I also tried calling the nursing station without success.  Plan: Will have [facility] RN CM follow up.  For questions, please contact:  Natividad Brood, RN BSN [facility] Liaison  [phone] business mobile phone Toll free office [phone]  Fax number: [phone] [email or web address]

## 2019-11-11 NOTE — TOC Transition Note (Addendum)
Transition of Care [facility]) - CM/SW Discharge Note   Patient Details  Name: KENANIAH BUSSARD MRN: [id number] Date of Birth: [birth year]  [facility] of [facility]) CM/SW Contact:  Maryclare Labrador, RN Phone Number: 11/11/2019, 4:37 PM   Clinical Narrative:   Pt deemed stable for discharge today.  Pt now in agreement with HH - CM provided medicare.gov HH choice - pt in agreement with [facility] .  Alvis Lemmings accepts pt .  Pt in agreement for Home oxygen - pt chose [facility] - agency accepts referral.  [facility] will bring portable tank to unit before discharge therefore pt will not need to wait for home equipment to be delivered.  Pt declined RW as recommended.  No other CM needs determined CM signing off  Final next level of care: Home w Home Health Services Barriers to Discharge: Barriers Resolved   Patient Goals and CMS Choice   CMS Medicare.gov Compare Post Acute Care list provided to:: Patient Choice offered to / list presented to : Patient  Discharge Placement                       Discharge Plan and Services                DME Arranged: Oxygen DME Agency: [facility] Date DME Agency Contacted: 11/11/19 Time DME Agency Contacted: 1227 Representative spoke with at DME Agency: [place]: PT, OT [place] Agency: [place] Date [place]: 11/11/19 Time [place]: 1212 Representative spoke with at [place]: [place] ([place]) Interventions     Readmission Risk Interventions No flowsheet data found.

## 2019-11-14 ENCOUNTER — Other Ambulatory Visit: Payer: Self-pay | Admitting: *Deleted

## 2019-11-14 NOTE — Patient Outreach (Signed)
[place] [facility]) Care Management  11/14/2019  Cody Floyd [birth year] [id number]   Subjective: Telephone call to patient's home number, spoke with male answering the phone, states Cody Floyd, is currently laying down, left HIPAA compliant message for Cody Floyd, and requested call back.    Objective: Per KPN (Knowledge Performance Now, point of care tool) and chart review, patient hospitalized 11/06/2019 - 11/11/2019 for acute hypoxic respiratory failure secondary to COVID-19 pneumonia.  Patient also has history of CAD, status post PCI, hypertension, Spinal stenosis, lumbar region, with neurogenic claudication, chronic back pain, Dyslipidemia, ventral hernia, BPH, Vertigo,  and Restless legs syndrome.   Had 1st Covid vaccine on 10/21/2019.  Patient due for 2nd Covid vaccine on 11/15/2019 but his quarantine period will end on April 3.     Assessment: Received [facility] Liaison referral on 11/11/2019.  Referral source: Cody Floyd.  Referral reason: Please assign to [place] for complex care and disease management follow up calls on 2nd vaccin which is scheduled too soon after diagnosis of COVID [next dose 11/15/2019] explained to patient to follow up with PCP at [facility], but was unable to get him back on the phone to ensure recommendations prior to leaving hospital and assess for further needs.  Diagnosis: COVID positive after receiving 1st vaccine.   Screening  follow up pending patient contact.      Plan: RNCM will send unsuccessful outreach  letter, [facility] pamphlet, will call patient for 2nd telephone outreach attempt within 4 business days, screening follow up, and will proceed with case closure within 10 business days if no return call.     Berk Pilot H. Annia Friendly, BSN, [place] Management [facility] Telephonic CM Phone: [phone] Fax: [phone]

## 2019-11-15 ENCOUNTER — Ambulatory Visit: Payer: Medicare Other

## 2019-11-15 ENCOUNTER — Other Ambulatory Visit: Payer: Self-pay | Admitting: *Deleted

## 2019-11-15 NOTE — Patient Outreach (Signed)
[facility] [facility]) Care Management  11/15/2019  Cody Floyd [birth year] [id number]   Subjective: Telephone call to patient's home number, no answer, left HIPAA compliant voicemail message, and requested call back.    Objective: Per KPN (Knowledge Performance Now, point of care tool) and chart review, patient hospitalized 11/06/2019 - 11/11/2019 for acute hypoxic respiratory failure secondary to COVID-19 pneumonia.  Patient also has history of CAD, status post PCI, hypertension, Spinal stenosis, lumbar region, with neurogenic claudication, chronic back pain, Dyslipidemia, ventral hernia, BPH, Vertigo,  and Restless legs syndrome.   Had 1st Covid vaccine on 10/21/2019.  Patient due for 2nd Covid vaccine on 11/15/2019 but his quarantine period will end on April 3.     Assessment: Received [facility] Liaison referral on 11/11/2019.  Referral source: Natividad Brood.  Referral reason: Please assign to [place] for complex care and disease management follow up calls on 2nd vaccin which is scheduled too soon after diagnosis of COVID [next dose 11/15/2019] explained to patient to follow up with PCP at [facility], but was unable to get him back on the phone to ensure recommendations prior to leaving hospital and assess for further needs.  Diagnosis: COVID positive after receiving 1st vaccine.   Screening  follow up pending patient contact.      Plan: RNCM has sent unsuccessful outreach  letter, [facility] pamphlet, will call patient for 3rd telephone outreach attempt within 4 business days, screening follow up, and will proceed with case closure within 10 business days if no return call.     Rayel Santizo H. Annia Friendly, BSN, [place] Management [facility] Telephonic CM Phone: [phone] Fax: [phone]

## 2019-11-21 ENCOUNTER — Other Ambulatory Visit: Payer: Self-pay | Admitting: *Deleted

## 2019-11-21 NOTE — Patient Outreach (Signed)
[place] [facility]) Care Management  11/21/2019  TAOS LANNEN 1941/06/23 [id number]   Subjective: Telephone call to patient's home number, no answer, left HIPAA compliant voicemail message, and requested call back.    Objective:Per KPN (Knowledge Performance Now, point of care tool) and chart review,patient hospitalized 11/06/2019 - 11/11/2019 foracute hypoxic respiratory failure secondary to COVID-19 pneumonia. Patient also has history of CAD, status post PCI, hypertension, Spinal stenosis, lumbar region, with neurogenic claudication, chronic back pain,Dyslipidemia, ventral hernia, BPH,Vertigo, and Restless legs syndrome. Had 1st Covid vaccine on 10/21/2019. Patient due for 2nd Covid vaccine on 11/15/2019 but his quarantine period will end on April 3.     Assessment: Received [facility] Liaison referral on 11/11/2019. Referral source: Natividad Brood. Referral reason: Please assign to [place] for complex care and disease management follow up calls on 2nd vaccin which is scheduled too soon after diagnosis of COVID [next dose 11/15/2019] explained to patient to follow up with PCP at [facility], but was unable to get him back on the phone to ensure recommendations prior to leaving hospital and assess for further needs.Diagnosis:COVID positive after receiving 1st vaccine.Screening follow up pending patient contact.      Plan:RNCM has sent unsuccessful outreach letter, [facility] pamphlet, will call patient for 4th telephone outreach attempt within 21 business days, screening follow up, and will proceed with case closure within 10 business days if no return call after 4th unsuccessful outreach.     Elham Fini H. Annia Friendly, BSN, [place] Management [facility] Telephonic CM Phone: [phone] Fax: [phone]

## 2019-12-12 ENCOUNTER — Other Ambulatory Visit: Payer: Self-pay | Admitting: *Deleted

## 2019-12-12 NOTE — Patient Outreach (Signed)
[place] [facility]) Care Management  12/12/2019  CONAGHER FAIRLESS 1941-02-19 [id number]   Subjective: Telephone call to patient's home and mobile number, no answer, left HIPAA compliant voicemail message, and requested call back.     Objective:Per KPN (Knowledge Performance Now, point of care tool) and chart review,patient hospitalized 11/06/2019 - 11/11/2019 foracute hypoxic respiratory failure secondary to COVID-19 pneumonia. Patient also has history of CAD, status post PCI, hypertension, Spinal stenosis, lumbar region, with neurogenic claudication, chronic back pain,Dyslipidemia, ventral hernia, BPH,Vertigo, and Restless legs syndrome. Had 1st Covid vaccine on 10/21/2019. Patient due for 2nd Covid vaccine on 11/15/2019 but his quarantine period will end on April 3.     Assessment: Received [facility] Liaison referral on 11/11/2019. Referral source: Natividad Brood. Referral reason: Please assign to [place] for complex care and disease management follow up calls on 2nd vaccin which is scheduled too soon after diagnosis of COVID [next dose 11/15/2019] explained to patient to follow up with PCP at [facility], but was unable to get him back on the phone to ensure recommendations prior to leaving hospital and assess for further needs.Diagnosis:COVID positive after receiving 1st vaccine.Screening follow up pending patient contact.      Plan:RNCMhassentunsuccessful outreach letter, [facility] pamphlet, will call patient for4th telephone outreach attempt within 21 business days, screening follow up, and will proceed with case closure within 10 business days if no return call after 4th unsuccessful outreach.     Sten Dematteo H. Annia Friendly, BSN, [place] Management [facility] Telephonic CM Phone: [phone] Fax: [phone]

## 2019-12-29 ENCOUNTER — Other Ambulatory Visit: Payer: Self-pay

## 2019-12-29 ENCOUNTER — Encounter: Payer: Self-pay | Admitting: Podiatry

## 2019-12-29 ENCOUNTER — Other Ambulatory Visit: Payer: Self-pay | Admitting: Podiatry

## 2019-12-29 ENCOUNTER — Ambulatory Visit: Payer: Medicare Other | Admitting: Podiatry

## 2019-12-29 ENCOUNTER — Ambulatory Visit (INDEPENDENT_AMBULATORY_CARE_PROVIDER_SITE_OTHER): Payer: Medicare Other

## 2019-12-29 VITALS — Temp 97.0°F

## 2019-12-29 DIAGNOSIS — M722 Plantar fascial fibromatosis: Secondary | ICD-10-CM

## 2019-12-29 DIAGNOSIS — M79672 Pain in left foot: Secondary | ICD-10-CM

## 2020-01-01 NOTE — Progress Notes (Signed)
Subjective:   Patient ID: Cody Floyd, male   DOB: 80 y.o.   MRN: [id number]   HPI Patient has developed severe discomfort in the plantar aspect of the left heel when he steps on it and states he does not remember specific injury and its been occurring over the last 1 to 2 weeks   ROS      Objective:  Physical Exam  Neurovascular status intact with exquisite discomfort plantar aspect left heel at the insertional point of the tendon into the calcaneus     Assessment:  Acute plantar fasciitis left with inflammation fluid of the medial band     Plan:  Reviewed condition sterile prep done and reinjected the fascia left 3 mg Kenalog 5 mg Xylocaine advised on physical therapy support shoe gear and not going barefoot.  Patient will be seen back to recheck and is encouraged to call with questions concerns  X-rays indicate that there is no indications currently of stress fracture but does have spur formation

## 2020-01-02 ENCOUNTER — Ambulatory Visit: Payer: Medicare Other | Attending: Internal Medicine

## 2020-01-02 ENCOUNTER — Other Ambulatory Visit: Payer: Self-pay | Admitting: *Deleted

## 2020-01-02 DIAGNOSIS — Z23 Encounter for immunization: Secondary | ICD-10-CM

## 2020-01-02 NOTE — Progress Notes (Signed)
   Covid-19 Vaccination Clinic  Name:  Cody Floyd    MRN: [id number] DOB: 1941/03/15  01/02/2020  [name] was observed post Covid-19 immunization for 15 minutes without incident. He was provided with Vaccine Information Sheet and instruction to access the V-Safe system.   [name] was instructed to call 911 with any severe reactions post vaccine: [name] Difficulty breathing  . Swelling of face and throat  . A fast heartbeat  . A bad rash all over body  . Dizziness and weakness   Immunizations Administered    Name Date Dose VIS Date Route   Pfizer COVID-19 Vaccine 01/02/2020  8:17 AM 0.3 mL 10/19/2018 Intramuscular   Manufacturer: [place]   Lot: [id number]   [place]: [id number]

## 2020-01-02 NOTE — Patient Outreach (Signed)
[facility]) Care Management  01/02/2020  Cody Floyd 17-Feb-1941 [id number]   No response from patient outreach attempts will proceed with case closure.     Objective:Per KPN (Knowledge Performance Now, point of care tool) and chart review,patient hospitalized 11/06/2019 - 11/11/2019 foracute hypoxic respiratory failure secondary to COVID-19 pneumonia. Patient also has history of CAD, status post PCI, hypertension, Spinal stenosis, lumbar region, with neurogenic claudication, chronic back pain,Dyslipidemia, ventral hernia, BPH,Vertigo, and Restless legs syndrome. Had 1st Covid vaccine on 10/21/2019. Patient due for 2nd Covid vaccine on 11/15/2019 but his quarantine period will end on April 3.     Assessment: Received [facility] Liaison referral on 11/11/2019. Referral source: Natividad Brood. Referral reason: Please assign to [place] for complex care and disease management follow up calls on 2nd vaccin which is scheduled too soon after diagnosis of COVID [next dose 11/15/2019] explained to patient to follow up with PCP at [facility], but was unable to get him back on the phone to ensure recommendations prior to leaving hospital and assess for further needs.Diagnosis:COVID positive after receiving 1st vaccine.Screening follow up not completed due to unable to contact patient and will proceed with case closure.      Plan:Case closure due to unable to reach.     Zeph Riebel H. Annia Friendly, BSN, [place] [facility] Telephonic CM Phone: [phone] Fax: [phone]

## 2020-01-29 ENCOUNTER — Other Ambulatory Visit: Payer: Self-pay | Admitting: Surgical

## 2020-01-30 ENCOUNTER — Other Ambulatory Visit: Payer: Self-pay | Admitting: Surgical

## 2020-01-30 NOTE — Telephone Encounter (Signed)
Pls advise.  

## 2020-04-06 DIAGNOSIS — H402211 Chronic angle-closure glaucoma, right eye, mild stage: Secondary | ICD-10-CM | POA: Diagnosis not present

## 2020-04-06 DIAGNOSIS — H2513 Age-related nuclear cataract, bilateral: Secondary | ICD-10-CM | POA: Diagnosis not present

## 2020-04-06 DIAGNOSIS — H40052 Ocular hypertension, left eye: Secondary | ICD-10-CM | POA: Diagnosis not present

## 2020-04-06 DIAGNOSIS — H402222 Chronic angle-closure glaucoma, left eye, moderate stage: Secondary | ICD-10-CM | POA: Diagnosis not present

## 2020-04-09 DIAGNOSIS — R739 Hyperglycemia, unspecified: Secondary | ICD-10-CM | POA: Diagnosis not present

## 2020-04-09 DIAGNOSIS — R7989 Other specified abnormal findings of blood chemistry: Secondary | ICD-10-CM | POA: Diagnosis not present

## 2020-04-09 DIAGNOSIS — I10 Essential (primary) hypertension: Secondary | ICD-10-CM | POA: Diagnosis not present

## 2020-04-13 DIAGNOSIS — H402211 Chronic angle-closure glaucoma, right eye, mild stage: Secondary | ICD-10-CM | POA: Diagnosis not present

## 2020-04-13 DIAGNOSIS — H2511 Age-related nuclear cataract, right eye: Secondary | ICD-10-CM | POA: Diagnosis not present

## 2020-04-13 DIAGNOSIS — H2513 Age-related nuclear cataract, bilateral: Secondary | ICD-10-CM | POA: Diagnosis not present

## 2020-04-13 DIAGNOSIS — H25013 Cortical age-related cataract, bilateral: Secondary | ICD-10-CM | POA: Diagnosis not present

## 2020-04-13 DIAGNOSIS — H402222 Chronic angle-closure glaucoma, left eye, moderate stage: Secondary | ICD-10-CM | POA: Diagnosis not present

## 2020-04-19 ENCOUNTER — Ambulatory Visit: Payer: Medicare Other | Admitting: Cardiology

## 2020-04-19 DIAGNOSIS — G2581 Restless legs syndrome: Secondary | ICD-10-CM | POA: Diagnosis not present

## 2020-04-19 DIAGNOSIS — I10 Essential (primary) hypertension: Secondary | ICD-10-CM | POA: Diagnosis not present

## 2020-04-19 DIAGNOSIS — K219 Gastro-esophageal reflux disease without esophagitis: Secondary | ICD-10-CM | POA: Diagnosis not present

## 2020-04-19 DIAGNOSIS — M5432 Sciatica, left side: Secondary | ICD-10-CM | POA: Diagnosis not present

## 2020-04-19 DIAGNOSIS — R739 Hyperglycemia, unspecified: Secondary | ICD-10-CM | POA: Diagnosis not present

## 2020-04-25 DIAGNOSIS — H25811 Combined forms of age-related cataract, right eye: Secondary | ICD-10-CM | POA: Diagnosis not present

## 2020-04-25 DIAGNOSIS — H2511 Age-related nuclear cataract, right eye: Secondary | ICD-10-CM | POA: Diagnosis not present

## 2020-04-25 DIAGNOSIS — H402211 Chronic angle-closure glaucoma, right eye, mild stage: Secondary | ICD-10-CM | POA: Diagnosis not present

## 2020-05-02 ENCOUNTER — Other Ambulatory Visit: Payer: Self-pay | Admitting: Cardiology

## 2020-05-09 DIAGNOSIS — H59021 Cataract (lens) fragments in eye following cataract surgery, right eye: Secondary | ICD-10-CM | POA: Diagnosis not present

## 2020-05-11 DIAGNOSIS — S30861A Insect bite (nonvenomous) of abdominal wall, initial encounter: Secondary | ICD-10-CM | POA: Diagnosis not present

## 2020-05-11 DIAGNOSIS — W57XXXA Bitten or stung by nonvenomous insect and other nonvenomous arthropods, initial encounter: Secondary | ICD-10-CM | POA: Diagnosis not present

## 2020-05-11 DIAGNOSIS — L089 Local infection of the skin and subcutaneous tissue, unspecified: Secondary | ICD-10-CM | POA: Diagnosis not present

## 2020-06-11 DIAGNOSIS — H25012 Cortical age-related cataract, left eye: Secondary | ICD-10-CM | POA: Diagnosis not present

## 2020-06-11 DIAGNOSIS — H2512 Age-related nuclear cataract, left eye: Secondary | ICD-10-CM | POA: Diagnosis not present

## 2020-06-18 ENCOUNTER — Encounter: Payer: Self-pay | Admitting: *Deleted

## 2020-06-19 ENCOUNTER — Ambulatory Visit: Payer: Medicare Other | Admitting: Diagnostic Neuroimaging

## 2020-06-19 ENCOUNTER — Encounter: Payer: Self-pay | Admitting: Diagnostic Neuroimaging

## 2020-06-19 VITALS — BP 169/89 | HR 69 | Ht 73.0 in | Wt 226.0 lb

## 2020-06-19 DIAGNOSIS — G629 Polyneuropathy, unspecified: Secondary | ICD-10-CM

## 2020-06-19 DIAGNOSIS — G5601 Carpal tunnel syndrome, right upper limb: Secondary | ICD-10-CM | POA: Diagnosis not present

## 2020-06-19 NOTE — Progress Notes (Addendum)
[facility]  PATIENT: Cody Floyd DOB: [birth year]  REFERRING CLINICIAN: Einar Gip HISTORY FROM: patient  REASON FOR VISIT: new consult / existing patient   HISTORICAL  CHIEF COMPLAINT:  Chief Complaint  Patient presents with  . Numbness in right arm    rm 6 "numbness x 5 weeks"    HISTORY OF PRESENT ILLNESS:   UPDATE (06/19/20, VRP): Since last visit, doing well, except had COVID in March 2021. THen had increasing right hand / arm numbness since August 2021. Symptoms are intermittent. Severity is mild. Worse with certain arm positions; better with shaking it out.   UPDATE (07/30/18, VRP): Since last visit, doing 07/30/18. Symptoms are progressive. More gait and balance diff. More falls. Referred here for cerebellar ataxia eval per Dr. Einar Gip. Back issues being managed by Dr. Brien Few and Dr. Annette Stable now. Does not remember seeing Dr. Louanne Skye in the past (? Memory loss). Uses cane occ (but did not bring today).   PRIOR HPI (01/23/16): 79 year old male here for evaluation of balance and gait difficulty. October 2015 patient had lumbar spine surgery due to low back pain radiating to left leg. Symptoms slightly improved but then returned. In December 2015 patient felt increasing balance and gait difficulty. Symptoms seem to fluctuate and were intermittent. These fluctuation and balance difficulty continued until March 2017. At this time he had significant increased back pain especially with twisting movements. Patient having more balance and walking problems especially if he walks longer distances. Patient was also found to have elevated CK levels, saw a rheumatologist Dr. Dossie Der, who then referred patient to me for further evaluation of ataxia.  Patient denies any problems with his fingers, hands or neck. No headache or vision changes.   REVIEW OF SYSTEMS: Full 14 system review of systems performed and negative with exception of: as per HPI.   ALLERGIES: No Known Allergies  HOME  MEDICATIONS: Outpatient Medications Prior to Visit  Medication Sig Dispense Refill  . amLODipine (NORVASC) 5 MG tablet Take 1 tablet (5 mg total) by mouth daily. 30 tablet 1  . aspirin EC 81 MG tablet Take 81 mg by mouth daily.    . clopidogrel (PLAVIX) 75 MG tablet TAKE 1 TABLET(75 MG) BY MOUTH DAILY WITH BREAKFAST 90 tablet 0  . meclizine (ANTIVERT) 25 MG tablet Take 25 mg by mouth 3 (three) times daily as needed for dizziness.    [name] omeprazole (PRILOSEC) 20 MG capsule Take 20 mg by mouth daily.    . pramipexole (MIRAPEX) 0.5 MG tablet TAKE 1 TABLET(0.5 MG) BY MOUTH AT BEDTIME 90 tablet 0  . albuterol (VENTOLIN HFA) 108 (90 Base) MCG/ACT inhaler Inhale 2 puffs into the lungs every 2 (two) hours as needed for wheezing or shortness of breath. (Patient not taking: Reported on 06/19/2020) 6.7 g 0  . atorvastatin (LIPITOR) 20 MG tablet Take 1 tablet (20 mg total) by mouth daily. (Patient not taking: Reported on 06/19/2020) 30 tablet 2  . benzonatate (TESSALON) 100 MG capsule Take 100 mg by mouth 3 (three) times daily as needed for cough. (Patient not taking: Reported on 06/19/2020)    . Cholecalciferol (VITAMIN D3 PO) Take 1 tablet by mouth daily. (Patient not taking: Reported on 06/19/2020)    . dextromethorphan-guaiFENesin (MUCINEX DM) 30-600 MG 12hr tablet Take 1 tablet by mouth 2 (two) times daily. (Patient not taking: Reported on 06/19/2020)    . fluticasone (FLONASE) 50 MCG/ACT nasal spray Place 2 sprays into both nostrils daily. (Patient not taking: Reported on 06/19/2020)  9.9 mL 0  . guaiFENesin-codeine (ROBITUSSIN AC) 100-10 MG/5ML syrup Take 5 mLs by mouth every 4 (four) hours as needed for cough. (Patient not taking: Reported on 06/19/2020)    . isosorbide mononitrate (IMDUR) 30 MG 24 hr tablet Take 1 tablet (30 mg total) by mouth daily. 90 tablet 0  . MAGNESIUM PO Take 1 tablet by mouth daily. (Patient not taking: Reported on 06/19/2020)    . Multiple Vitamins-Minerals (ZINC PO) Take 1  tablet by mouth daily. (Patient not taking: Reported on 06/19/2020)    . nitroGLYCERIN (NITROSTAT) 0.4 MG SL tablet Place 0.4 mg under the tongue every 5 (five) minutes as needed for chest pain. (Patient not taking: Reported on 06/19/2020)    . terazosin (HYTRIN) 5 MG capsule Take 5 mg by mouth at bedtime.  (Patient not taking: Reported on 06/19/2020)    . pantoprazole (PROTONIX) 40 MG tablet TAKE 1 TABLET(40 MG) BY MOUTH DAILY (Patient taking differently: Take 40 mg by mouth daily. ) 90 tablet 1  . predniSONE (DELTASONE) 10 MG tablet Take 40 mg daily for 1 day, 30 mg daily for 1 day, 20 mg daily for 1 days,10 mg daily for 1 day, then stop 10 tablet 0   No facility-administered medications prior to visit.    PAST MEDICAL HISTORY: Past Medical History:  Diagnosis Date  . Arthritis    "a little all over my body" (06/01/2018)  . BPH (benign prostatic hyperplasia)   . Chronic lower back pain   . Coronary artery disease   . GERD (gastroesophageal reflux disease)   . History of BPH   . Hyperlipidemia   . Hypertension   . Numbness    right arm  . RLS (restless legs syndrome)   . Sciatica   . Ventral hernia   . Vertigo     PAST SURGICAL HISTORY: Past Surgical History:  Procedure Laterality Date  . BACK SURGERY    . CORONARY ANGIOPLASTY WITH STENT PLACEMENT  06/01/2018  . CORONARY STENT INTERVENTION N/A 06/01/2018   Procedure: CORONARY STENT INTERVENTION;  Surgeon: Adrian Prows, MD;  Location: [facility];  Service: Cardiovascular;  Laterality: N/A;  . HAMMER TOE SURGERY Left 2012  . JOINT REPLACEMENT    . LEFT HEART CATH AND CORONARY ANGIOGRAPHY N/A 06/01/2018   Procedure: LEFT HEART CATH AND CORONARY ANGIOGRAPHY;  Surgeon: Adrian Prows, MD;  Location: [place] [facility];  Service: Cardiovascular;  Laterality: N/A;  . LUMBAR [place]     "I had 2 other lower back ORs before the one in 2015"  . LUMBAR LAMINECTOMY/DECOMPRESSION MICRODISCECTOMY N/A 05/29/2014   Procedure: CENTRAL  LUMBAR LAMINECTOMY L3-4 and L4-5;  Surgeon: Jessy Oto, MD;  Location: [facility];  Service: Orthopedics;  Laterality: N/A;  . TOTAL KNEE ARTHROPLASTY Left 03/31/2017   Procedure: LEFT TOTAL KNEE ARTHROPLASTY;  Surgeon: Meredith Pel, MD;  Location: [facility];  Service: Orthopedics;  Laterality: Left;  . TOTAL KNEE ARTHROPLASTY Right 05/24/2019   Procedure: RIGHT TOTAL KNEE ARTHROPLASTY;  Surgeon: Meredith Pel, MD;  Location: [facility];  Service: Orthopedics;  Laterality: Right;    FAMILY HISTORY: Family History  Problem Relation Age of Onset  . Osteoarthritis Mother     SOCIAL HISTORY:  Social History   Socioeconomic History  . Marital status: Significant Other    Spouse name: Diane  . Number of children: 0  . Years of education: 8  . 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 Smoking status: Never Smoker  . Smokeless tobacco: Never Used  Vaping Use  . Vaping Use: Never used  Substance and Sexual Activity  . Alcohol use: Yes    Comment: 06/01/2018  "probably have 3 beers/month"  . Drug use: Never  . Sexual activity: Not Currently  Other Topics Concern  . Not on file  Social History Narrative   Lives with sig other   Caffeine use- coffee all day   Social Determinants of Health   Financial Resource Strain:   . Difficulty of Paying Living Expenses: Not on file  Food Insecurity:   . Worried About Charity fundraiser in the Last Year: Not on file  . Ran Out of Food in the Last Year: Not on file  Transportation Needs:   . Lack of Transportation (Medical): Not on file  . Lack of Transportation (Non-Medical): Not on file  Physical Activity:   . Days of Exercise per Week: Not on file  . Minutes of Exercise per Session: Not on file  Stress:   . Feeling of Stress : Not on file  Social Connections:   . Frequency of Communication with Friends and Family: Not on file  . Frequency of Social  Gatherings with Friends and Family: Not on file  . Attends Religious Services: Not on file  . Active Member of Clubs or Organizations: Not on file  . Attends Archivist Meetings: Not on file  . Marital Status: Not on file  Intimate Partner Violence:   . Fear of Current or Ex-Partner: Not on file  . Emotionally Abused: Not on file  . Physically Abused: Not on file  . Sexually Abused: Not on file     PHYSICAL EXAM  GENERAL EXAM/CONSTITUTIONAL: Vitals:  Vitals:   06/19/20 1515  BP: (!) 169/89  Pulse: 69  Weight: 226 lb (102.5 kg)  Height: 6\' 1"  (1.854 m)   Body mass index is 29.82 kg/m. No exam data present  Patient is in no distress; well developed, nourished; SLIGHTLY DISHEVELED; neck is supple  ARTHRITIS CHANGES IN HANDS, WRISTS, ELBOWS, KNEES  CARDIOVASCULAR:  Examination of carotid arteries is normal; no carotid bruits  Regular rate and rhythm, no murmurs  Examination of peripheral vascular system by observation and palpation is normal  EYES:  Ophthalmoscopic exam of optic discs and posterior segments is normal; no papilledema or hemorrhages  MUSCULOSKELETAL:  Gait, strength, tone, movements noted in Neurologic exam below  NEUROLOGIC: MENTAL STATUS:  No flowsheet data found.  awake, alert, oriented to person, place and time  recent and remote memory intact  normal attention and concentration  language fluent, comprehension intact, naming intact,   fund of knowledge appropriate  CRANIAL NERVE:   2nd - no papilledema on fundoscopic exam  2nd, 3rd, 4th, 6th - pupils equal and reactive to light, visual fields full to confrontation, extraocular muscles intact, no nystagmus  5th - facial sensation symmetric  7th - facial strength symmetric  8th - hearing intact  9th - palate elevates symmetrically, uvula midline  11th - shoulder shrug symmetric  12th - tongue protrusion midline  MILD HOARSE VOICE  MOTOR:   NO RIGIDITY; MILD  BRADYKINESIA (? RELATED TO ARTHRITIS)  normal bulk and tone, full strength in the BUE, BLE; EXCEPT ATROPHY AND WEAKNESS OF RIGHT > LEFT APB  SENSORY:   normal and symmetric to light touch, temperature, vibration; EXCEPT DECR PP IN FINGERTIPS BILATERALLY; ABSENT PP  IN FEET; ABSENT VIB IN FEET  COORDINATION:   finger-nose-finger, fine finger movements --> slow  REFLEXES:   deep tendon reflexes TRACE and symmetric; ABSENT AT ANKLES  GAIT/STATION:   UNSTEADY GAIT; USING SINGLE POINT CANE     DIAGNOSTIC DATA (LABS, IMAGING, TESTING) - I reviewed patient records, labs, notes, testing and imaging myself where available.  Lab Results  Component Value Date   WBC 6.6 11/11/2019   HGB 12.9 (L) 11/11/2019   HCT 39.2 11/11/2019   MCV 80.7 11/11/2019   PLT 113 (L) 11/11/2019      Component Value Date/Time   NA 137 11/11/2019 0214   K 4.2 11/11/2019 0214   CL 101 11/11/2019 0214   CO2 26 11/11/2019 0214   GLUCOSE 163 (H) 11/11/2019 0214   BUN 28 (H) 11/11/2019 0214   CREATININE 0.96 11/11/2019 0214   CALCIUM 8.2 (L) 11/11/2019 0214   PROT 5.3 (L) 11/11/2019 0214   PROT 6.6 07/30/2018 0927   ALBUMIN 3.0 (L) 11/11/2019 0214   AST 57 (H) 11/11/2019 0214   ALT 71 (H) 11/11/2019 0214   ALKPHOS 50 11/11/2019 0214   BILITOT 1.1 11/11/2019 0214   GFRNONAA >60 11/11/2019 0214   GFRAA >60 11/11/2019 0214   No results found for: CHOL, HDL, LDLCALC, LDLDIRECT, TRIG, CHOLHDL Lab Results  Component Value Date   HGBA1C 5.6 07/30/2018   Lab Results  Component Value Date   VITAMINB12 310 07/30/2018   Lab Results  Component Value Date   TSH 2.090 07/30/2018    05/24/14 EKG [I reviewed images myself and agree with interpretation. -VRP]  - sinus bradycardia with 1st degree AV block  04/21/14 MRI lumbar spine [I reviewed images myself and agree with interpretation. -VRP]  1. Moderate to severe multifactorial spinal stenosis at L3-4 with mild lateral recess stenosis bilaterally.  2.  Severe multifactorial spinal stenosis at L4-5. There is significant lateral recess stenosis with mild right greater than left foraminal stenosis. 3. Postsurgical changes on the right at L5-S1 with chronic disc degeneration and osteophytes contributing to mild biforaminal stenosis, but no definite nerve root encroachment.  01/14/16 CT lumbar spine [I reviewed images myself and agree with interpretation. -VRP]  - L1-2 multifactorial mild spinal stenosis. - L2-3 multifactorial mild spinal stenosis and mild bilateral lateral recess stenosis (greater on left). - L3-4 prior posterior decompression. Multifactorial right greater than left foraminal narrowing and slight encroachment upon the exiting right L3 nerve root. Multifactorial moderate canal narrowing most notable just above the disc space. - L4-5 prior posterior decompression. Prominent facet degenerative changes and bony overgrowth. 7 mm anterior slip L4. Multifactorial moderate to marked bilateral foraminal narrowing and encroachment upon the exiting L4 nerve roots. No significant canal narrowing although evaluation limited by postop changes. - L5-S1 prior right hemilaminectomy. Multifactorial mild to moderate bilateral foraminal narrowing. Central spur with mild bilateral lateral recess stenosis and minimal indentation ventral thecal sac. - Calcified ectatic/dilated aorta and iliac arteries incompletely assessed on present exam.  01/31/16 MRI lumbar spine [I reviewed images myself and agree with interpretation. -VRP]  1. At L3-4: posterior decompression, disc bulging, facet hypertrophy, ligamentum flavum hypertrophy, with severe right and moderate left foraminal stenosis 2. At L4-5: posterior decompression, disc bulging, facet hypertrophy, ligamentum flavum hypertrophy, with severe biforaminal stenosis 3. At L5-S1: right laminectomy, bone spurring, facet hypertrophy with moderate-severe biforaminal stenosis and right greater than left lateral recess  stenoses 4. Enhancing cystic lesions in the right L3 pedicle and right L4-5 facet joint, may represent  synovial cysts. These are stable from CT lumbar spine on 01/14/16, but not seen on MRI from 04/21/14. 5. Compared to MRI on 04/21/14, there has been interval lumbar decompression surgery.   09/02/18 EMG/NCS - Severe length dependent axonal sensorimotor polyneuropathy. - Superimposed right median neuropathy at the wrist consistent with right carpal tunnel syndrome.      ASSESSMENT AND PLAN  79 y.o. year old male here with history of lumbar spinal stenosis, status post decompression surgery (2015, Dr. Louanne Skye), left knee replacement (Aug 2018, Dr. Marlou Sa) now with increasing balance difficulty. Most likely these are related to progression of lumbar spinal degenerative disease and radiculopathies (currently being managed by Dr. Annette Stable and Dr. Brien Few). Also with evidence of neuropathy or other posterior column dysfunction (decr proprioception). No evidence of cerebellar ataxia at this time.   Ddx of balance difficulty: lumbar radiculopathy +/- neuropathy  1. Neuropathy   2. Carpal tunnel syndrome of right wrist      PLAN:  RIGHT HAND / ARM NUMBNESS (worse since August 2021) - likely right carpal tunnel syndrome; noted on EMG from 2020 - wear wrist splint; refer to hand surgery clinic  GAIT DIFFICULTY (due to lumbar degenerative spine disease + neuropathy) - check neuropathy labs - refer to PT evaluation; use cane at all times  Orders Placed This Encounter  Procedures  . Vitamin B12  . A1c  . TSH  . SPEP with IFE  . ANA w/Reflex  . SSA, SSB  . ANCA  . Vitamin B1  . Hepatitis C antibody  . Hepatitis B core antibody, total  . Hepatitis B surface antigen  . Hepatitis B surface antibody, qualitative  . RPR  . HIV  . Ambulatory referral to Hand Surgery   Return for pending if symptoms worsen or fail to improve, return to PCP.    Penni Bombard, MD 75/64/3329, 5:18  PM Certified in Neurology, Neurophysiology and Neuroimaging  [facility] [address], [place], [place] [address] [phone]

## 2020-06-19 NOTE — Patient Instructions (Signed)
  RIGHT HAND / ARM NUMBNESS (worse since August 2021) - likely right carpal tunnel syndrome; noted on EMG from 2020 - wear wrist splint; refer to hand surgery clinic  GAIT DIFFICULTY (due to lumbar degenerative spine disease + neuropathy) - check neuropathy labs - use cane at all times

## 2020-06-24 LAB — SJOGREN'S SYNDROME ANTIBODS(SSA + SSB)
ENA SSA (RO) Ab: <0.2 AI (ref 0.0–0.9)
ENA SSB (LA) Ab: <0.2 AI (ref 0.0–0.9)

## 2020-06-24 LAB — ANA W/REFLEX: ANA Titer 1: NEGATIVE

## 2020-06-24 LAB — PAN-ANCA
ANCA Proteinase 3: <3.5 U/mL (ref 0.0–3.5)
Atypical pANCA: <1:20 {titer}
C-ANCA: <1:20 {titer}
Myeloperoxidase Ab: <9 U/mL (ref 0.0–9.0)
P-ANCA: <1:20 {titer}

## 2020-06-24 LAB — HEMOGLOBIN A1C
Est. average glucose Bld gHb Est-mCnc: 117 mg/dL
Hgb A1c MFr Bld: 5.7 % — ABNORMAL HIGH (ref 4.8–5.6)

## 2020-06-24 LAB — MULTIPLE MYELOMA PANEL, SERUM
Albumin SerPl Elph-Mcnc: 4.1 g/dL (ref 2.9–4.4)
Albumin/Glob SerPl: 1.5 (ref 0.7–1.7)
Alpha 1: 0.2 g/dL (ref 0.0–0.4)
Alpha2 Glob SerPl Elph-Mcnc: 0.8 g/dL (ref 0.4–1.0)
B-Globulin SerPl Elph-Mcnc: 1.1 g/dL (ref 0.7–1.3)
Gamma Glob SerPl Elph-Mcnc: 0.8 g/dL (ref 0.4–1.8)
Globulin, Total: 2.8 g/dL (ref 2.2–3.9)
IgA/Immunoglobulin A, Serum: 213 mg/dL (ref 61–437)
IgG (Immunoglobin G), Serum: 808 mg/dL (ref 603–1613)
IgM (Immunoglobulin M), Srm: 13 mg/dL — ABNORMAL LOW (ref 15–143)
Total Protein: 6.9 g/dL (ref 6.0–8.5)

## 2020-06-24 LAB — HEPATITIS B SURFACE ANTIBODY,QUALITATIVE: Hep B Surface Ab, Qual: NONREACTIVE

## 2020-06-24 LAB — VITAMIN B1: Thiamine: 146.1 nmol/L (ref 66.5–200.0)

## 2020-06-24 LAB — VITAMIN B12: Vitamin B-12: 341 pg/mL (ref 232–1245)

## 2020-06-24 LAB — RPR: RPR Ser Ql: NONREACTIVE

## 2020-06-24 LAB — HEPATITIS B CORE ANTIBODY, TOTAL: Hep B Core Total Ab: NEGATIVE

## 2020-06-24 LAB — TSH: TSH: 4.07 u[IU]/mL (ref 0.450–4.500)

## 2020-06-24 LAB — HIV ANTIBODY (ROUTINE TESTING W REFLEX): HIV Screen 4th Generation wRfx: NONREACTIVE

## 2020-06-24 LAB — HEPATITIS B SURFACE ANTIGEN: Hepatitis B Surface Ag: NEGATIVE

## 2020-06-24 LAB — HEPATITIS C ANTIBODY: Hep C Virus Ab: <0.1 s/co ratio (ref 0.0–0.9)

## 2020-06-25 ENCOUNTER — Telehealth: Payer: Self-pay | Admitting: *Deleted

## 2020-06-26 NOTE — Telephone Encounter (Signed)
LVM informing patient he had unremarkable labs. No cause for neuropathy found. He should continue supportive care, use cane, see hand surgeon, call for questions.

## 2020-06-27 ENCOUNTER — Ambulatory Visit: Payer: Medicare Other | Admitting: Diagnostic Neuroimaging

## 2020-06-27 DIAGNOSIS — H25012 Cortical age-related cataract, left eye: Secondary | ICD-10-CM | POA: Diagnosis not present

## 2020-06-27 DIAGNOSIS — H25812 Combined forms of age-related cataract, left eye: Secondary | ICD-10-CM | POA: Diagnosis not present

## 2020-06-27 DIAGNOSIS — H402222 Chronic angle-closure glaucoma, left eye, moderate stage: Secondary | ICD-10-CM | POA: Diagnosis not present

## 2020-06-27 DIAGNOSIS — H401122 Primary open-angle glaucoma, left eye, moderate stage: Secondary | ICD-10-CM | POA: Diagnosis not present

## 2020-06-27 DIAGNOSIS — H2512 Age-related nuclear cataract, left eye: Secondary | ICD-10-CM | POA: Diagnosis not present

## 2020-07-18 DIAGNOSIS — R21 Rash and other nonspecific skin eruption: Secondary | ICD-10-CM | POA: Diagnosis not present

## 2020-07-18 DIAGNOSIS — L02414 Cutaneous abscess of left upper limb: Secondary | ICD-10-CM | POA: Diagnosis not present

## 2020-08-01 ENCOUNTER — Other Ambulatory Visit: Payer: Self-pay | Admitting: Cardiology

## 2020-08-06 DIAGNOSIS — L308 Other specified dermatitis: Secondary | ICD-10-CM | POA: Diagnosis not present

## 2020-08-13 ENCOUNTER — Encounter: Payer: Self-pay | Admitting: Cardiology

## 2020-08-15 ENCOUNTER — Other Ambulatory Visit: Payer: Self-pay | Admitting: Cardiology

## 2020-08-15 DIAGNOSIS — M48061 Spinal stenosis, lumbar region without neurogenic claudication: Secondary | ICD-10-CM | POA: Diagnosis not present

## 2020-08-27 DIAGNOSIS — L2089 Other atopic dermatitis: Secondary | ICD-10-CM | POA: Diagnosis not present

## 2020-11-10 IMAGING — DX DG CHEST 1V PORT
1 series · 2 of 2 positions shown · non-contrast
Comparison: 03/20/2017 chest radiograph

CLINICAL DATA: Cough. COVID positive.

EXAM:
PORTABLE CHEST 1 VIEW

[Series 1: chest · 0.14mm/px · 2 of 2 slices shown]
[im 1/2]
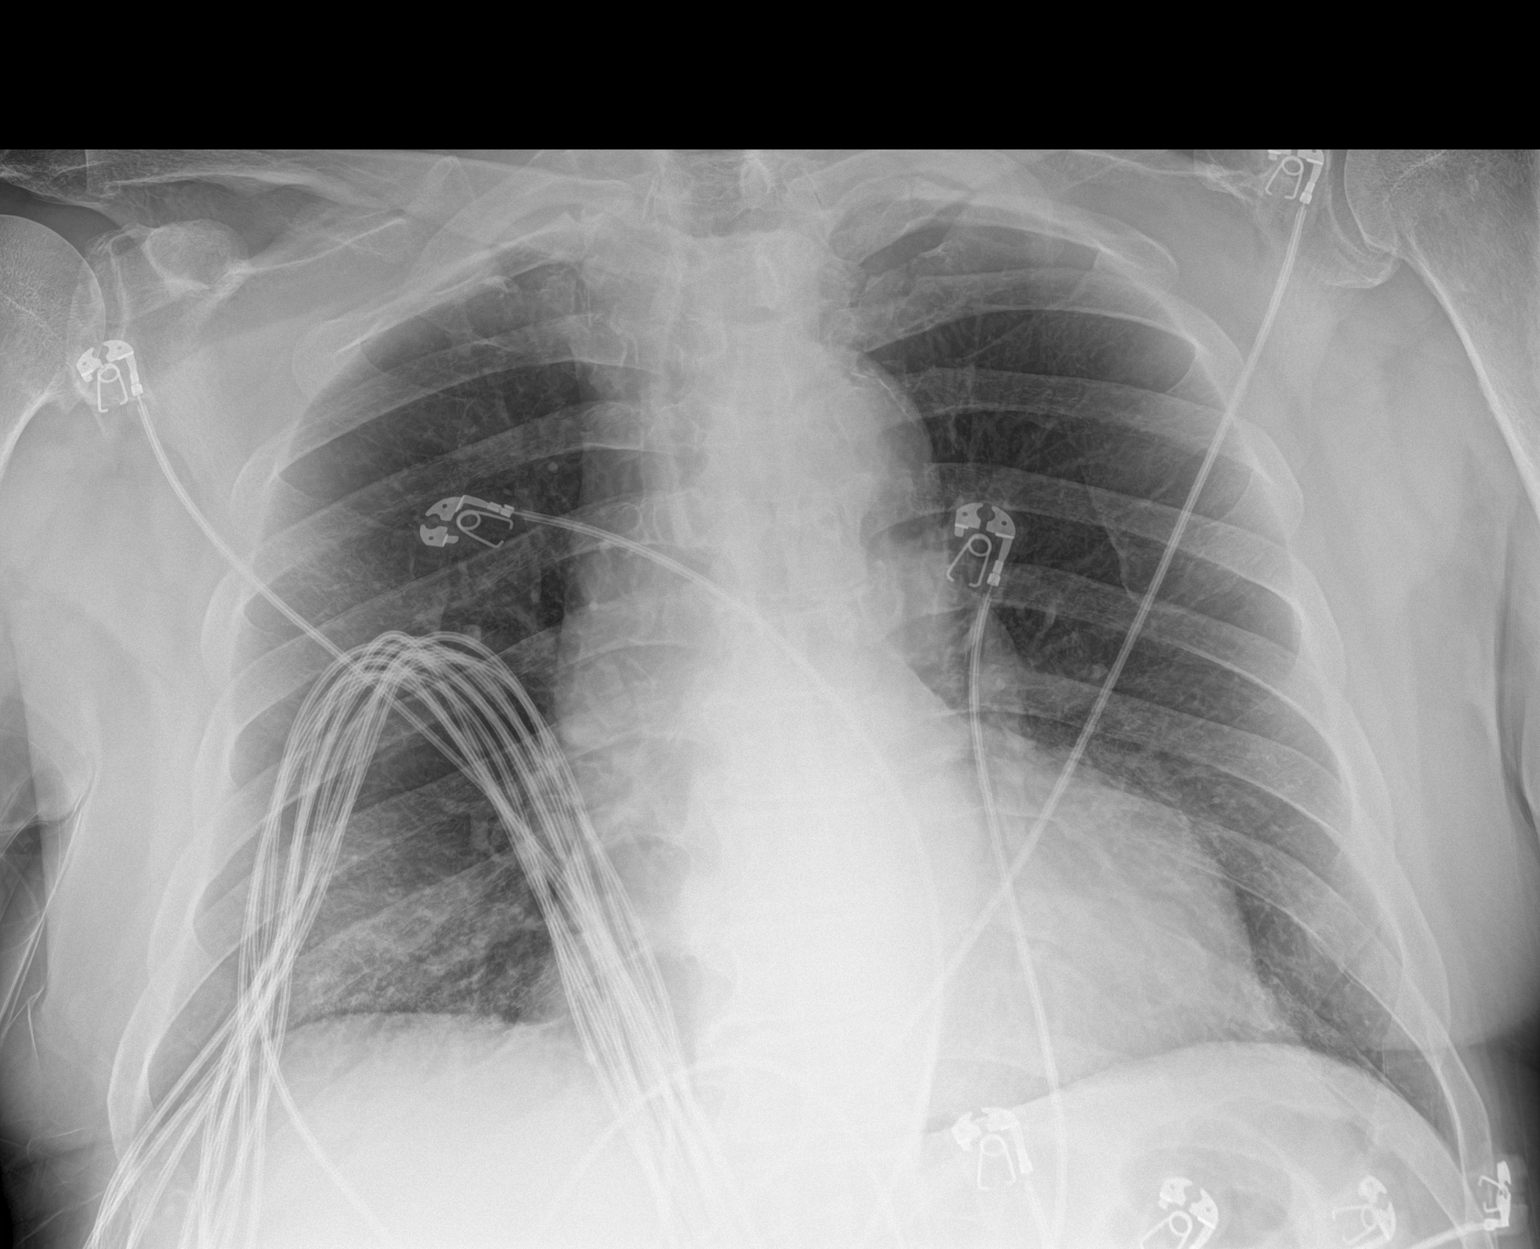
[im 2/2]
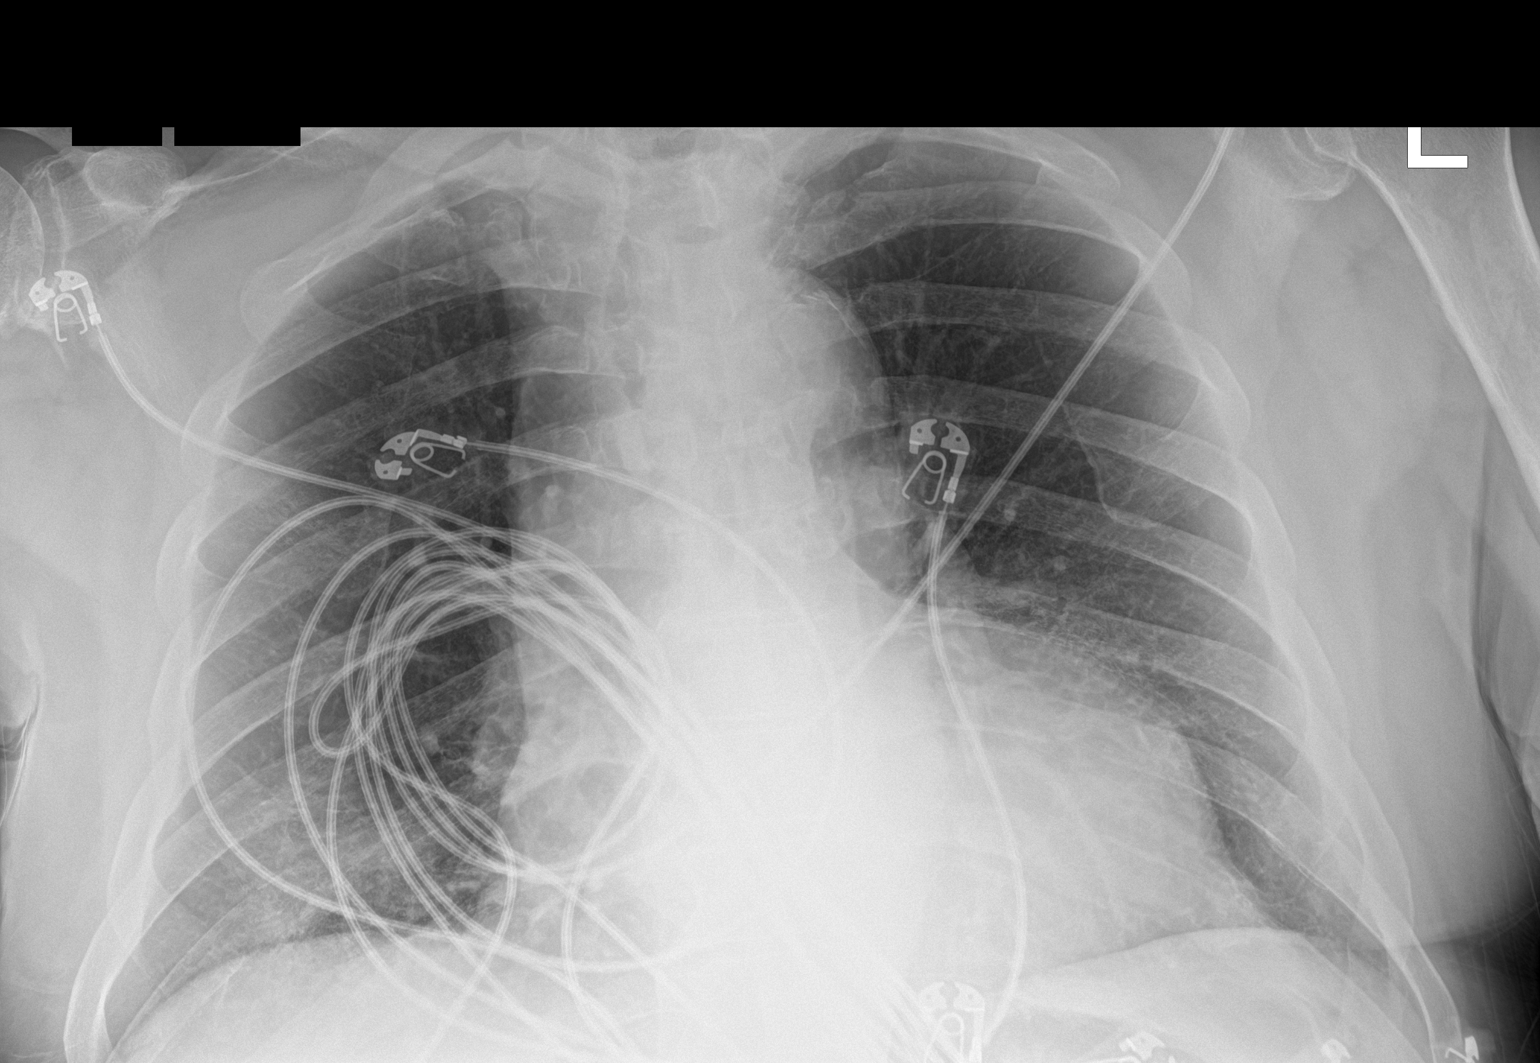

[2 of 2 positions shown; findings below may reference images not displayed]

FINDINGS: UPPER limits normal heart size noted.

Patchy RIGHT LOWER lung opacity noted likely representing
infection/pneumonia.

No pulmonary mass, pleural effusion or pneumothorax.

No acute bony abnormalities are present.
IMPRESSION: Patchy RIGHT LOWER lung opacity likely representing
infection/pneumonia.

## 2020-11-27 DIAGNOSIS — H402211 Chronic angle-closure glaucoma, right eye, mild stage: Secondary | ICD-10-CM | POA: Diagnosis not present

## 2020-11-27 DIAGNOSIS — H04123 Dry eye syndrome of bilateral lacrimal glands: Secondary | ICD-10-CM | POA: Diagnosis not present

## 2020-11-27 DIAGNOSIS — H402222 Chronic angle-closure glaucoma, left eye, moderate stage: Secondary | ICD-10-CM | POA: Diagnosis not present

## 2021-03-05 DIAGNOSIS — H524 Presbyopia: Secondary | ICD-10-CM | POA: Diagnosis not present

## 2021-03-05 DIAGNOSIS — H402222 Chronic angle-closure glaucoma, left eye, moderate stage: Secondary | ICD-10-CM | POA: Diagnosis not present

## 2021-03-05 DIAGNOSIS — H402211 Chronic angle-closure glaucoma, right eye, mild stage: Secondary | ICD-10-CM | POA: Diagnosis not present

## 2021-03-05 DIAGNOSIS — H04123 Dry eye syndrome of bilateral lacrimal glands: Secondary | ICD-10-CM | POA: Diagnosis not present

## 2021-06-07 DIAGNOSIS — I10 Essential (primary) hypertension: Secondary | ICD-10-CM | POA: Diagnosis not present

## 2021-06-07 DIAGNOSIS — R7989 Other specified abnormal findings of blood chemistry: Secondary | ICD-10-CM | POA: Diagnosis not present

## 2021-06-07 DIAGNOSIS — R5383 Other fatigue: Secondary | ICD-10-CM | POA: Diagnosis not present

## 2021-06-07 DIAGNOSIS — W19XXXA Unspecified fall, initial encounter: Secondary | ICD-10-CM | POA: Diagnosis not present

## 2021-06-07 DIAGNOSIS — R739 Hyperglycemia, unspecified: Secondary | ICD-10-CM | POA: Diagnosis not present

## 2021-06-07 DIAGNOSIS — R7303 Prediabetes: Secondary | ICD-10-CM | POA: Diagnosis not present

## 2021-06-07 DIAGNOSIS — Z0000 Encounter for general adult medical examination without abnormal findings: Secondary | ICD-10-CM | POA: Diagnosis not present

## 2021-06-17 DIAGNOSIS — Z23 Encounter for immunization: Secondary | ICD-10-CM | POA: Diagnosis not present

## 2021-06-17 DIAGNOSIS — R5383 Other fatigue: Secondary | ICD-10-CM | POA: Diagnosis not present

## 2021-06-17 DIAGNOSIS — R200 Anesthesia of skin: Secondary | ICD-10-CM | POA: Diagnosis not present

## 2021-06-17 DIAGNOSIS — I10 Essential (primary) hypertension: Secondary | ICD-10-CM | POA: Diagnosis not present

## 2021-06-17 DIAGNOSIS — R739 Hyperglycemia, unspecified: Secondary | ICD-10-CM | POA: Diagnosis not present

## 2021-06-17 DIAGNOSIS — K219 Gastro-esophageal reflux disease without esophagitis: Secondary | ICD-10-CM | POA: Diagnosis not present

## 2021-06-17 DIAGNOSIS — R0602 Shortness of breath: Secondary | ICD-10-CM | POA: Diagnosis not present

## 2021-06-17 DIAGNOSIS — G629 Polyneuropathy, unspecified: Secondary | ICD-10-CM | POA: Diagnosis not present

## 2021-06-17 DIAGNOSIS — R7303 Prediabetes: Secondary | ICD-10-CM | POA: Diagnosis not present

## 2021-06-17 DIAGNOSIS — G2581 Restless legs syndrome: Secondary | ICD-10-CM | POA: Diagnosis not present

## 2021-06-25 DIAGNOSIS — G629 Polyneuropathy, unspecified: Secondary | ICD-10-CM | POA: Diagnosis not present

## 2021-06-25 DIAGNOSIS — R0602 Shortness of breath: Secondary | ICD-10-CM | POA: Diagnosis not present

## 2021-06-25 DIAGNOSIS — I10 Essential (primary) hypertension: Secondary | ICD-10-CM | POA: Diagnosis not present

## 2021-06-25 DIAGNOSIS — R5383 Other fatigue: Secondary | ICD-10-CM | POA: Diagnosis not present

## 2021-07-29 ENCOUNTER — Other Ambulatory Visit: Payer: Self-pay

## 2021-07-29 ENCOUNTER — Encounter: Payer: Self-pay | Admitting: Cardiology

## 2021-07-29 ENCOUNTER — Ambulatory Visit: Payer: Medicare Other | Admitting: Cardiology

## 2021-07-29 VITALS — BP 138/82 | HR 65 | Temp 98.0°F | Resp 16 | Ht 73.0 in | Wt 241.8 lb

## 2021-07-29 DIAGNOSIS — R0609 Other forms of dyspnea: Secondary | ICD-10-CM | POA: Diagnosis not present

## 2021-07-29 DIAGNOSIS — I25118 Atherosclerotic heart disease of native coronary artery with other forms of angina pectoris: Secondary | ICD-10-CM

## 2021-07-29 DIAGNOSIS — I453 Trifascicular block: Secondary | ICD-10-CM

## 2021-07-29 DIAGNOSIS — I10 Essential (primary) hypertension: Secondary | ICD-10-CM

## 2021-07-29 NOTE — Progress Notes (Signed)
Primary Physician:  Deon Pilling, NP   Patient ID: Cody Floyd, male    DOB: 09-24-1940, 80 y.o.   MRN: [id number]  Subjective:    Chief Complaint  Patient presents with   Coronary Artery Disease   Hypertension   Follow-up    6 months    Cody Floyd  is a 80 y.o. male  With lumbar radiculopathy with gait imbalance, in which he uses a cane, GERD, hypertension, migraines, and hyperlipidemia, CAD s/p complex intervention to the proximal LAD implantation of 3 overlapping stents and also obtuse marginal 1 stenosis S/P drug-eluting stent placed on 06/01/2018.    I have seen him >1.5 years ago, he made an appointment to see me as his significant other at home who is his girlfriend has noticed marked dyspnea on exertion even doing minimal activity.  Overall his activity level has decreased as he has had difficulty with gait, arthritis and back pain.  He has not had any chest pain, denies any syncope or near syncope.  States that occasionally he has noticed his blood pressure to be low.  He has not had any chest pain.  Past Medical History:  Diagnosis Date   Arthritis    "a little all over my body" (06/01/2018)   BPH (benign prostatic hyperplasia)    Chronic lower back pain    Coronary artery disease    GERD (gastroesophageal reflux disease)    History of BPH    Hyperlipidemia    Hypertension    Numbness    right arm   RLS (restless legs syndrome)    Sciatica    Ventral hernia    Vertigo     Past Surgical History:  Procedure Laterality Date   BACK SURGERY     CORONARY ANGIOPLASTY WITH STENT PLACEMENT  06/01/2018   CORONARY STENT INTERVENTION N/A 06/01/2018   Procedure: CORONARY STENT INTERVENTION;  Surgeon: Adrian Prows, MD;  Location: [facility];  Service: Cardiovascular;  Laterality: N/A;   HAMMER TOE SURGERY Left 2012   JOINT REPLACEMENT     LEFT HEART CATH AND CORONARY ANGIOGRAPHY N/A 06/01/2018   Procedure: LEFT HEART CATH AND CORONARY ANGIOGRAPHY;  Surgeon: Adrian Prows, MD;  Location: [facility];  Service: Cardiovascular;  Laterality: N/A;   LUMBAR DISC SURGERY     "I had 2 other lower back ORs before the one in 2015"   LUMBAR LAMINECTOMY/DECOMPRESSION MICRODISCECTOMY N/A 05/29/2014   Procedure: CENTRAL LUMBAR LAMINECTOMY L3-4 and L4-5;  Surgeon: Jessy Oto, MD;  Location: [facility];  Service: Orthopedics;  Laterality: N/A;   TOTAL KNEE ARTHROPLASTY Left 03/31/2017   Procedure: LEFT TOTAL KNEE ARTHROPLASTY;  Surgeon: Meredith Pel, MD;  Location: [facility];  Service: Orthopedics;  Laterality: Left;   TOTAL KNEE ARTHROPLASTY Right 05/24/2019   Procedure: RIGHT TOTAL KNEE ARTHROPLASTY;  Surgeon: Meredith Pel, MD;  Location: [facility];  Service: Orthopedics;  Laterality: Right;   Social History   Tobacco Use   Smoking status: Never   Smokeless tobacco: Never  Substance Use Topics   Alcohol use: Yes    Comment: 06/01/2018  "probably have 3 beers/month"    Family History  Problem Relation Age of Onset   Osteoarthritis Mother     Review of Systems  Cardiovascular:  Positive for dyspnea on exertion. Negative for chest pain and leg swelling.  Musculoskeletal:  Positive for arthritis, back pain and muscle weakness.  Gastrointestinal:  Negative for melena.  Neurological:  Positive  for loss of balance.  Objective:  Blood pressure 138/82, pulse 65, temperature 98 F (36.7 C), temperature source Temporal, resp. rate 16, height _0  (1.854 m), weight 241 lb 12.8 oz (109.7 kg), SpO2 97 %. Body mass index is 31.9 kg/m.   Vitals with BMI 07/29/2021 06/19/2020 11/11/2019  Height _1  _2  -  Weight 241 lbs 13 oz 226 lbs -  BMI 41.03 01.31 -  Systolic 438 887 579  Diastolic 82 89 72  Pulse 65 69 101      Physical Exam Vitals reviewed.  Constitutional:      Appearance: He is well-developed.     Comments: Mildly obese  Neck:     Vascular: No carotid bruit or JVD.  Cardiovascular:     Rate and Rhythm: Normal rate and regular rhythm.      Pulses: Intact distal pulses.          Femoral pulses are 2+ on the right side with bruit and 2+ on the left side with bruit.      Popliteal pulses are 2+ on the right side and 1+ on the left side.       Dorsalis pedis pulses are 0 on the right side and 0 on the left side.       Posterior tibial pulses are 0 on the right side and 0 on the left side.     Heart sounds: Normal heart sounds.  Pulmonary:     Effort: Pulmonary effort is normal. No accessory muscle usage or respiratory distress.     Breath sounds: Normal breath sounds.  Abdominal:     General: Bowel sounds are normal.     Palpations: Abdomen is soft.  Musculoskeletal:     Right lower leg: No edema.     Left lower leg: Edema (trace. Venostasis changes noted) present.   Radiology: No results found.  Laboratory examination:    CMP Latest Ref Rng & Units 06/19/2020 11/11/2019 11/10/2019  Glucose 70 - 99 mg/dL - 163(H) 139(H)  BUN 8 - 23 mg/dL - 28(H) 29(H)  Creatinine 0.61 - 1.24 mg/dL - 0.96 1.05  Sodium 135 - 145 mmol/L - 137 138  Potassium 3.5 - 5.1 mmol/L - 4.2 3.9  Chloride 98 - 111 mmol/L - 101 102  CO2 22 - 32 mmol/L - 26 24  Calcium 8.9 - 10.3 mg/dL - 8.2(L) 8.4(L)  Total Protein 6.0 - 8.5 g/dL 6.9 5.3(L) 5.5(L)  Total Bilirubin 0.3 - 1.2 mg/dL - 1.1 1.0  Alkaline Phos 38 - 126 U/L - 50 48  AST 15 - 41 U/L - 57(H) 65(H)  ALT 0 - 44 U/L - 71(H) 65(H)   CBC Latest Ref Rng & Units 11/11/2019 11/10/2019 11/09/2019  WBC 4.0 - 10.5 K/uL 6.6 6.4 8.1  Hemoglobin 13.0 - 17.0 g/dL 12.9(L) 13.2 12.6(L)  Hematocrit 39.0 - 52.0 % 39.2 38.9(L) 37.6(L)  Platelets 150 - 400 K/uL 113(L) 112(L) 122(L)   Lipid Panel  No results found for: CHOL, TRIG, HDL, CHOLHDL, VLDL, LDLCALC, LDLDIRECT HEMOGLOBIN A1C Lab Results  Component Value Date   HGBA1C 5.7 (H) 06/19/2020   TSH No results for input(s): TSH in the last 8760 hours.   External labs: 07/06/2018: Cholesterol 120, triglycerides 104, HDL 37, LDL 62.  03/30/2018: eGFR  74, Creatinine 1.1, Potassium 4.9, BMP normal. Platelets 138, CBC otherwise normal. TSH 2.5. HB 14.0/HCT 41.9, platelets 138,000. Vitamin B12 373.  PRN Meds:. Medications Discontinued During This Encounter  Medication Reason  dextromethorphan-guaiFENesin (MUCINEX DM) 30-600 MG 12hr tablet    guaiFENesin-codeine (ROBITUSSIN AC) 100-10 MG/5ML syrup    isosorbide mononitrate (IMDUR) 30 MG 24 hr tablet    aspirin 81 MG chewable tablet Duplicate   Current Meds  Medication Sig   albuterol (VENTOLIN HFA) 108 (90 Base) MCG/ACT inhaler Inhale 2 puffs into the lungs every 2 (two) hours as needed for wheezing or shortness of breath.   amLODipine (NORVASC) 5 MG tablet Take 1 tablet (5 mg total) by mouth daily.   aspirin EC 81 MG tablet Take 81 mg by mouth daily.   atorvastatin (LIPITOR) 20 MG tablet Take 1 tablet (20 mg total) by mouth daily.   benzonatate (TESSALON) 100 MG capsule Take 100 mg by mouth 3 (three) times daily as needed for cough.   Cholecalciferol (VITAMIN D3 PO) Take 1 tablet by mouth daily.   clopidogrel (PLAVIX) 75 MG tablet TAKE 1 TABLET(75 MG) BY MOUTH DAILY WITH BREAKFAST   fluticasone (FLONASE) 50 MCG/ACT nasal spray Place 2 sprays into both nostrils daily.   lisinopril (ZESTRIL) 10 MG tablet Take 10 mg by mouth daily.   MAGNESIUM PO Take 1 tablet by mouth daily.   meclizine (ANTIVERT) 25 MG tablet Take 25 mg by mouth 3 (three) times daily as needed for dizziness.   Multiple Vitamins-Minerals (ZINC PO) Take 1 tablet by mouth daily.   nitroGLYCERIN (NITROSTAT) 0.4 MG SL tablet Place 0.4 mg under the tongue every 5 (five) minutes as needed for chest pain.   omeprazole (PRILOSEC) 20 MG capsule Take 20 mg by mouth daily.   pramipexole (MIRAPEX) 0.5 MG tablet TAKE 1 TABLET(0.5 MG) BY MOUTH AT BEDTIME   terazosin (HYTRIN) 5 MG capsule Take 5 mg by mouth at bedtime.    Cardiac Studies:   Coronary angiogram 06/01/2018: LM short normal. LAD prox to mid 80-99% S/P 2.5x40, 2.75x15  and 2.5x13 mm Orserio DES. OM1 80% S/P 2.5x13 mm Orserio DES. All stenosis reduced to 0%. Mid RCA 50%. Normal LV.  Echocardiogram 08/22/2019: Left ventricle cavity is normal in size. Moderate concentric hypertrophy of the left ventricle. Normal global wall motion. Normal LV systolic function with EF 55%. Doppler evidence of grade I (impaired) diastolic dysfunction, normal LAP.  Trileaflet aortic valve with mild aortic valve leaflet calcification. Moderate (Grade II) aortic regurgitation. Moderate (Grade II) mitral regurgitation. Inadequate TR jet to estimate pulmonary artery systolic pressure. Normal right atrial pressure.  No significant change compared to previous study on 08/01/2019.  Lexiscan Tetrofosmin Stress Test  08/08/2019: Nondiagnostic ECG stress. Resting SR, LAD, LAFB, RBBB, trifascicular block. CRO inferior infarct, old.  Perfusion images reveal a moderate to large sized inferior, inferoapical and apical septal scar with mild peri-infarct ischemia in the distribution of right coronary artery or a dominant circumflex coronary artery. Overall function is abnormal with regional wall motion abnormalities. Stress LV EF: 49%. Severely elevated LV end diastolic volume of 161 mL in both rest and stress images.  Intermediate risk study.  Compared to 04/02/2018, no significant change in ischemic findings however, LV dilatation and mild decrease in LV function is new.  EKG:  EKG 07/29/2021: Sinus rhythm with first-degree AV block at rate of 65 bpm, left anterior fascicular block, cannot exclude inferior infarct old, right bundle branch block.  Trifascicular block.  Low voltage complexes.  No significant change from 07/19/2019.  Assessment:     ICD-10-CM   1. Atherosclerosis of native coronary artery of native heart with stable angina pectoris ([place])  I25.118 EKG 12-Lead  PCV ECHOCARDIOGRAM COMPLETE    PCV MYOCARDIAL PERFUSION WITH LEXISCAN    2. Dyspnea on exertion  R06.09 PCV  ECHOCARDIOGRAM COMPLETE    PCV MYOCARDIAL PERFUSION WITH LEXISCAN    3. Primary hypertension  I10     4. Trifascicular block  I45.3      Recommendations:   RANDON SOMERA  is a 80 y.o. male  With lumbar radiculopathy with gait imbalance, in which he uses a cane, GERD, hypertension, migraines, and hyperlipidemia, CAD s/p complex intervention to the proximal LAD implantation of 3 overlapping stents and also obtuse marginal 1 stenosis S/P drug-eluting stent placed on 06/01/2018.   He made an appointment to see [place] in view of worsening dyspnea on exertion.  No PND or orthopnea.  I suspect physical deconditioning to be the etiology for his dyspnea as he has great difficulty, arthritis and also has decreased his physical activity.  However he has had abnormal nuclear stress test and ability to repeat the nuclear stress test to see if there has been increasing ischemic burden.  I will also repeat an echocardiogram to evaluate his LV systolic function and mitral regurgitation noted previously.  I do not have his recent labs, will obtain previously performed labs including lipid profile testing.  With regard to hypertension, blood pressure is controlled, slightly elevated, however patient states that at times his blood pressure has been very low at home.  Hence I did not make any changes to his medication.  He does have underlying trifascicular block but is essentially asymptomatic without any syncope.  I would like to see him back in 4 to 6 weeks for follow-up.    Adrian Prows, MD, [facility] 07/29/2021, 9:47 AM Office: [phone] Fax: [phone] Pager: [phone]

## 2021-07-30 ENCOUNTER — Institutional Professional Consult (permissible substitution): Payer: Medicare Other | Admitting: Diagnostic Neuroimaging

## 2021-07-31 ENCOUNTER — Ambulatory Visit: Payer: Medicare Other | Admitting: Diagnostic Neuroimaging

## 2021-07-31 ENCOUNTER — Encounter: Payer: Self-pay | Admitting: Diagnostic Neuroimaging

## 2021-07-31 VITALS — BP 164/82 | HR 69 | Ht 73.0 in | Wt 242.6 lb

## 2021-07-31 DIAGNOSIS — G5601 Carpal tunnel syndrome, right upper limb: Secondary | ICD-10-CM

## 2021-07-31 NOTE — Patient Instructions (Signed)
RIGHT HAND / ARM NUMBNESS (worse since August 2021) - due to right carpal tunnel syndrome; noted on EMG from 2020 - wear wrist splint; refer to hand surgery clinic - follow up with PCP and rheumatology re: arthritis changes  GAIT DIFFICULTY (due to lumbar degenerative spine disease + idiopathic neuropathy) - check invitae amyloid / hereditary neuropathy panels - consider PT evaluation; use cane at all times

## 2021-07-31 NOTE — Progress Notes (Signed)
[facility]  PATIENT: Cody Floyd DOB: 25-May-1941  REFERRING CLINICIAN: Deon Pilling, NP HISTORY FROM: patient  REASON FOR VISIT: new consult / existing patient   HISTORICAL  CHIEF COMPLAINT:  Chief Complaint  Patient presents with   Numbness    Rm 7  sig other- Diane  "numbness in right hand and from knees down to feet, never saw hand surgeon- didn't go to appointment"     HISTORY OF PRESENT ILLNESS:   UPDATE (07/31/21, VRP): Since last visit, doing about the same. Did not go to hand surg referral. PCP referred back here, but did not know that I already saw patient in Oct 2021.  UPDATE (06/19/20, VRP): Since last visit, doing well, except had COVID in March 2021. Then had increasing right hand / arm numbness since August 2021. Symptoms are intermittent. Severity is mild. Worse with certain arm positions; better with shaking it out.   UPDATE (07/30/18, VRP): Since last visit, doing 07/30/18. Symptoms are progressive. More gait and balance diff. More falls. Referred here for cerebellar ataxia eval per Dr. Einar Gip. Back issues being managed by Dr. Brien Few and Dr. Annette Stable now. Does not remember seeing Dr. Louanne Skye in the past (? Memory loss). Uses cane occ (but did not bring today).   PRIOR HPI (01/23/16): 80 year old male here for evaluation of balance and gait difficulty. October 2015 patient had lumbar spine surgery due to low back pain radiating to left leg. Symptoms slightly improved but then returned. In December 2015 patient felt increasing balance and gait difficulty. Symptoms seem to fluctuate and were intermittent. These fluctuation and balance difficulty continued until March 2017. At this time he had significant increased back pain especially with twisting movements. Patient having more balance and walking problems especially if he walks longer distances. Patient was also found to have elevated CK levels, saw a rheumatologist Dr. Dossie Der, who then referred patient to me for  further evaluation of ataxia.  Patient denies any problems with his fingers, hands or neck. No headache or vision changes.   REVIEW OF SYSTEMS: Full 14 system review of systems performed and negative with exception of: as per HPI.   ALLERGIES: No Known Allergies  HOME MEDICATIONS: Outpatient Medications Prior to Visit  Medication Sig Dispense Refill   albuterol (VENTOLIN HFA) 108 (90 Base) MCG/ACT inhaler Inhale 2 puffs into the lungs every 2 (two) hours as needed for wheezing or shortness of breath. 6.7 g 0   amLODipine (NORVASC) 5 MG tablet Take 1 tablet (5 mg total) by mouth daily. 30 tablet 1   aspirin EC 81 MG tablet Take 81 mg by mouth daily.     atorvastatin (LIPITOR) 20 MG tablet Take 1 tablet (20 mg total) by mouth daily. 30 tablet 2   benzonatate (TESSALON) 100 MG capsule Take 100 mg by mouth 3 (three) times daily as needed for cough.     Cholecalciferol (VITAMIN D3 PO) Take 1 tablet by mouth daily.     clopidogrel (PLAVIX) 75 MG tablet TAKE 1 TABLET(75 MG) BY MOUTH DAILY WITH BREAKFAST 90 tablet 0   fluticasone (FLONASE) 50 MCG/ACT nasal spray Place 2 sprays into both nostrils daily. 9.9 mL 0   lisinopril (ZESTRIL) 10 MG tablet Take 10 mg by mouth daily.     MAGNESIUM PO Take 1 tablet by mouth daily.     meclizine (ANTIVERT) 25 MG tablet Take 25 mg by mouth 3 (three) times daily as needed for dizziness.     Multiple Vitamins-Minerals (ZINC PO)  Take 1 tablet by mouth daily.     nitroGLYCERIN (NITROSTAT) 0.4 MG SL tablet Place 0.4 mg under the tongue every 5 (five) minutes as needed for chest pain.     omeprazole (PRILOSEC) 20 MG capsule Take 20 mg by mouth daily.     pramipexole (MIRAPEX) 0.5 MG tablet TAKE 1 TABLET(0.5 MG) BY MOUTH AT BEDTIME 90 tablet 0   terazosin (HYTRIN) 5 MG capsule Take 5 mg by mouth at bedtime.     No facility-administered medications prior to visit.    PAST MEDICAL HISTORY: Past Medical History:  Diagnosis Date   Arthritis    "a little all over  my body" (06/01/2018)   BPH (benign prostatic hyperplasia)    Chronic lower back pain    Coronary artery disease    GERD (gastroesophageal reflux disease)    History of BPH    Hyperlipidemia    Hypertension    Numbness    right arm   RLS (restless legs syndrome)    Sciatica    Ventral hernia    Vertigo     PAST SURGICAL HISTORY: Past Surgical History:  Procedure Laterality Date   BACK SURGERY     CORONARY ANGIOPLASTY WITH STENT PLACEMENT  06/01/2018   CORONARY STENT INTERVENTION N/A 06/01/2018   Procedure: CORONARY STENT INTERVENTION;  Surgeon: Adrian Prows, MD;  Location: [facility];  Service: Cardiovascular;  Laterality: N/A;   HAMMER TOE SURGERY Left 2012   JOINT REPLACEMENT     LEFT HEART CATH AND CORONARY ANGIOGRAPHY N/A 06/01/2018   Procedure: LEFT HEART CATH AND CORONARY ANGIOGRAPHY;  Surgeon: Adrian Prows, MD;  Location: [facility];  Service: Cardiovascular;  Laterality: N/A;   LUMBAR DISC SURGERY     "I had 2 other lower back ORs before the one in 2015"   LUMBAR LAMINECTOMY/DECOMPRESSION MICRODISCECTOMY N/A 05/29/2014   Procedure: CENTRAL LUMBAR LAMINECTOMY L3-4 and L4-5;  Surgeon: Jessy Oto, MD;  Location: [facility];  Service: Orthopedics;  Laterality: N/A;   TOTAL KNEE ARTHROPLASTY Left 03/31/2017   Procedure: LEFT TOTAL KNEE ARTHROPLASTY;  Surgeon: Meredith Pel, MD;  Location: [facility];  Service: Orthopedics;  Laterality: Left;   TOTAL KNEE ARTHROPLASTY Right 05/24/2019   Procedure: RIGHT TOTAL KNEE ARTHROPLASTY;  Surgeon: Meredith Pel, MD;  Location: [facility];  Service: Orthopedics;  Laterality: Right;    FAMILY HISTORY: Family History  Problem Relation Age of Onset   Osteoarthritis Mother     SOCIAL HISTORY:  Social History   Socioeconomic History   Marital status: Significant Other    Spouse name: Diane   Number of children: 0   Years of education: 8   Highest education level: Not on file  Occupational History    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[employer], Clinical biochemist  Tobacco Use   Smoking status: Never   Smokeless tobacco: Never  Vaping Use   Vaping Use: Never used  Substance and Sexual Activity   Alcohol use: Yes    Comment: 06/01/2018  "probably have 3 beers/month"   Drug use: Never   Sexual activity: Not Currently  Other Topics Concern   Not on file  Social History Narrative   Lives with sig other   Caffeine use- coffee all day   Social Determinants of Health   Financial Resource Strain: Not on file  Food Insecurity: Not on file  Transportation Needs: Not on file  Physical Activity: Not on file  Stress: Not on file  Social Connections: Not  on file  Intimate Partner Violence: Not on file     PHYSICAL EXAM  GENERAL EXAM/CONSTITUTIONAL: Vitals:  Vitals:   07/31/21 0821  BP: (!) 164/82  Pulse: 69  Weight: 242 lb 9.6 oz (110 kg)  Height: 6\' 1"  (1.854 m)   Body mass index is 32.01 kg/m. No results found. Patient is in no distress; well developed, nourished; SLIGHTLY DISHEVELED; neck is supple ARTHRITIS CHANGES IN HANDS, WRISTS, ELBOWS, KNEES  CARDIOVASCULAR: Examination of carotid arteries is normal; no carotid bruits Regular rate and rhythm, no murmurs Examination of peripheral vascular system by observation and palpation is normal  EYES: Ophthalmoscopic exam of optic discs and posterior segments is normal; no papilledema or hemorrhages  MUSCULOSKELETAL: Gait, strength, tone, movements noted in Neurologic exam below  NEUROLOGIC: MENTAL STATUS:  No flowsheet data found. awake, alert, oriented to person, place and time recent and remote memory intact normal attention and concentration language fluent, comprehension intact, naming intact,  fund of knowledge appropriate  CRANIAL NERVE:  2nd - no papilledema on fundoscopic exam 2nd, 3rd, 4th, 6th - pupils equal and reactive to light, visual fields full to confrontation, extraocular muscles intact, no nystagmus 5th - facial sensation  symmetric 7th - facial strength symmetric 8th - hearing intact 9th - palate elevates symmetrically, uvula midline 11th - shoulder shrug symmetric 12th - tongue protrusion midline MILD HOARSE VOICE  MOTOR:  NO RIGIDITY; MILD BRADYKINESIA (? RELATED TO ARTHRITIS) normal bulk and tone, full strength in the BUE, BLE; EXCEPT ATROPHY AND WEAKNESS OF RIGHT > LEFT APB  SENSORY:  normal and symmetric to light touch, temperature, vibration; EXCEPT DECR PP IN FINGERTIPS BILATERALLY; ABSENT PP IN FEET; ABSENT VIB IN FEET  COORDINATION:  finger-nose-finger, fine finger movements --> slow  REFLEXES:  deep tendon reflexes TRACE and symmetric; ABSENT AT ANKLES  GAIT/STATION:  UNSTEADY GAIT; USING SINGLE POINT CANE     DIAGNOSTIC DATA (LABS, IMAGING, TESTING) - I reviewed patient records, labs, notes, testing and imaging myself where available.  Lab Results  Component Value Date   WBC 6.6 11/11/2019   HGB 12.9 (L) 11/11/2019   HCT 39.2 11/11/2019   MCV 80.7 11/11/2019   PLT 113 (L) 11/11/2019      Component Value Date/Time   NA 137 11/11/2019 0214   K 4.2 11/11/2019 0214   CL 101 11/11/2019 0214   CO2 26 11/11/2019 0214   GLUCOSE 163 (H) 11/11/2019 0214   BUN 28 (H) 11/11/2019 0214   CREATININE 0.96 11/11/2019 0214   CALCIUM 8.2 (L) 11/11/2019 0214   PROT 6.9 06/19/2020 1629   ALBUMIN 3.0 (L) 11/11/2019 0214   AST 57 (H) 11/11/2019 0214   ALT 71 (H) 11/11/2019 0214   ALKPHOS 50 11/11/2019 0214   BILITOT 1.1 11/11/2019 0214   GFRNONAA >60 11/11/2019 0214   GFRAA >60 11/11/2019 0214   No results found for: CHOL, HDL, LDLCALC, LDLDIRECT, TRIG, CHOLHDL Lab Results  Component Value Date   HGBA1C 5.7 (H) 06/19/2020   Lab Results  Component Value Date   [id number] 06/19/2020   Lab Results  Component Value Date   TSH 4.070 06/19/2020   06/19/20 NEUROPATHY LABS - ANA. ANCA, SPEP, HIV, RPR, HepB, HepC, B1 --> all normal  05/24/14 EKG [I reviewed images myself and  agree with interpretation. -VRP]  - sinus bradycardia with 1st degree AV block  04/21/14 MRI lumbar spine [I reviewed images myself and agree with interpretation. -VRP]  1. Moderate to severe multifactorial spinal stenosis at L3-4  with mild lateral recess stenosis bilaterally.  2. Severe multifactorial spinal stenosis at L4-5. There is significant lateral recess stenosis with mild right greater than left foraminal stenosis. 3. Postsurgical changes on the right at L5-S1 with chronic disc degeneration and osteophytes contributing to mild biforaminal stenosis, but no definite nerve root encroachment.  01/14/16 CT lumbar spine [I reviewed images myself and agree with interpretation. -VRP]  - L1-2 multifactorial mild spinal stenosis. - L2-3 multifactorial mild spinal stenosis and mild bilateral lateral recess stenosis (greater on left). - L3-4 prior posterior decompression. Multifactorial right greater than left foraminal narrowing and slight encroachment upon the exiting right L3 nerve root. Multifactorial moderate canal narrowing most notable just above the disc space. - L4-5 prior posterior decompression. Prominent facet degenerative changes and bony overgrowth. 7 mm anterior slip L4. Multifactorial moderate to marked bilateral foraminal narrowing and encroachment upon the exiting L4 nerve roots. No significant canal narrowing although evaluation limited by postop changes. - L5-S1 prior right hemilaminectomy. Multifactorial mild to moderate bilateral foraminal narrowing. Central spur with mild bilateral lateral recess stenosis and minimal indentation ventral thecal sac. - Calcified ectatic/dilated aorta and iliac arteries incompletely assessed on present exam.  01/31/16 MRI lumbar spine [I reviewed images myself and agree with interpretation. -VRP]  1. At L3-4: posterior decompression, disc bulging, facet hypertrophy, ligamentum flavum hypertrophy, with severe right and moderate left foraminal  stenosis 2. At L4-5: posterior decompression, disc bulging, facet hypertrophy, ligamentum flavum hypertrophy, with severe biforaminal stenosis 3. At L5-S1: right laminectomy, bone spurring, facet hypertrophy with moderate-severe biforaminal stenosis and right greater than left lateral recess stenoses 4. Enhancing cystic lesions in the right L3 pedicle and right L4-5 facet joint, may represent synovial cysts. These are stable from CT lumbar spine on 01/14/16, but not seen on MRI from 04/21/14. 5. Compared to MRI on 04/21/14, there has been interval lumbar decompression surgery.   09/02/18 EMG/NCS - Severe length dependent axonal sensorimotor polyneuropathy. - Superimposed right median neuropathy at the wrist consistent with right carpal tunnel syndrome.      ASSESSMENT AND PLAN  80 y.o. year old male here with history of lumbar spinal stenosis, status post decompression surgery (2015, Dr. Louanne Skye), left knee replacement (Aug 2018, Dr. Marlou Sa) now with increasing balance difficulty. Most likely these are related to progression of lumbar spinal degenerative disease and radiculopathies (currently being managed by Dr. Annette Stable and Dr. Brien Few). Also with evidence of neuropathy or other posterior column dysfunction (decr proprioception). No evidence of cerebellar ataxia at this time.   Ddx of balance difficulty: lumbar radiculopathy +/- neuropathy  1. Right carpal tunnel syndrome       PLAN:  RIGHT HAND / ARM NUMBNESS (worse since August 2021) - due to right carpal tunnel syndrome; noted on EMG from 2020 - wear wrist splint; refer to hand surgery clinic - follow up with PCP and rheumatology re: arthritis changes  GAIT DIFFICULTY (due to lumbar degenerative spine disease + idiopathic neuropathy) - check invitae amyloid / hereditary neuropathy panels - consider PT evaluation; use cane at all times  Orders Placed This Encounter  Procedures   Ambulatory referral to Hand Surgery   Return for return  to PCP.    Penni Bombard, MD 05/02/3531, 9:92 AM Certified in Neurology, Neurophysiology and Neuroimaging  [facility] [address] [place], [place] [address] [phone]

## 2021-08-12 ENCOUNTER — Ambulatory Visit: Payer: Medicare Other

## 2021-08-12 ENCOUNTER — Other Ambulatory Visit: Payer: Self-pay

## 2021-08-12 DIAGNOSIS — R0609 Other forms of dyspnea: Secondary | ICD-10-CM | POA: Diagnosis not present

## 2021-08-12 DIAGNOSIS — I25118 Atherosclerotic heart disease of native coronary artery with other forms of angina pectoris: Secondary | ICD-10-CM

## 2021-08-20 NOTE — Progress Notes (Signed)
Normal heart function, no change from 2020, continue to exercise regularly and to let us know if he is not doing well.

## 2021-08-21 ENCOUNTER — Other Ambulatory Visit: Payer: Medicare Other

## 2021-08-21 DIAGNOSIS — I10 Essential (primary) hypertension: Secondary | ICD-10-CM | POA: Diagnosis not present

## 2021-08-21 DIAGNOSIS — H60502 Unspecified acute noninfective otitis externa, left ear: Secondary | ICD-10-CM | POA: Diagnosis not present

## 2021-08-21 DIAGNOSIS — H6692 Otitis media, unspecified, left ear: Secondary | ICD-10-CM | POA: Diagnosis not present

## 2021-08-23 NOTE — Progress Notes (Signed)
Attempted to call pt, no answer. Left vm requesting call back.

## 2021-08-27 NOTE — Progress Notes (Signed)
Attempted to call pt, no answer. Left vm requesting call back.

## 2021-08-30 NOTE — Progress Notes (Signed)
Third attempt to contact pt, no answer. Left vm requesting call back. No attempt has been made to contact us at this time.

## 2021-09-05 DIAGNOSIS — H04123 Dry eye syndrome of bilateral lacrimal glands: Secondary | ICD-10-CM | POA: Diagnosis not present

## 2021-09-05 DIAGNOSIS — H402222 Chronic angle-closure glaucoma, left eye, moderate stage: Secondary | ICD-10-CM | POA: Diagnosis not present

## 2021-09-05 DIAGNOSIS — H402211 Chronic angle-closure glaucoma, right eye, mild stage: Secondary | ICD-10-CM | POA: Diagnosis not present

## 2021-09-06 ENCOUNTER — Other Ambulatory Visit: Payer: Self-pay | Admitting: Cardiology

## 2021-09-09 ENCOUNTER — Ambulatory Visit: Payer: Medicare Other | Admitting: Cardiology

## 2021-09-09 ENCOUNTER — Encounter: Payer: Self-pay | Admitting: Cardiology

## 2021-09-09 ENCOUNTER — Other Ambulatory Visit: Payer: Self-pay

## 2021-09-09 VITALS — BP 140/78 | HR 69 | Temp 98.3°F | Resp 16 | Ht 73.0 in | Wt 240.0 lb

## 2021-09-09 DIAGNOSIS — I25118 Atherosclerotic heart disease of native coronary artery with other forms of angina pectoris: Secondary | ICD-10-CM | POA: Diagnosis not present

## 2021-09-09 DIAGNOSIS — I951 Orthostatic hypotension: Secondary | ICD-10-CM | POA: Diagnosis not present

## 2021-09-09 DIAGNOSIS — R0609 Other forms of dyspnea: Secondary | ICD-10-CM | POA: Diagnosis not present

## 2021-09-09 DIAGNOSIS — E7800 Pure hypercholesterolemia, unspecified: Secondary | ICD-10-CM

## 2021-09-09 DIAGNOSIS — I10 Essential (primary) hypertension: Secondary | ICD-10-CM

## 2021-09-09 MED ORDER — ATORVASTATIN CALCIUM 20 MG PO TABS: 20.0000 mg | ORAL_TABLET | Freq: Every day | ORAL | 3 refills | Status: AC

## 2021-09-09 NOTE — Progress Notes (Signed)
Primary Physician:  Deon Pilling, NP   Patient ID: Cody Floyd, male    DOB: 02/14/1941, 81 y.o.   MRN: [id number]  Subjective:    Chief Complaint  Patient presents with   Shortness of Breath   Follow-up    6 weeks   Coronary Artery Disease    Cody Floyd  is a 81 y.o. male  With lumbar radiculopathy with gait imbalance, he uses a cane, GERD, hypertension, orthostatic hypotension, migraines, and hyperlipidemia, CAD s/p complex intervention to the proximal LAD implantation of 3 overlapping stents and also obtuse marginal 1 stenosis S/P drug-eluting stent placed on 06/01/2018.  He also has peripheral arterial disease with small vessel disease below the knee by Dopplers in 2020.  He was seen by me about 2 months ago for worsening dyspnea.  His main complaint today is severe back pain and sciatica.  Overall his activity level has decreased as he has had difficulty with gait, arthritis and back pain.  He has not had any chest pain, denies any syncope or near syncope.  States that occasionally he has noticed his blood pressure to be low.  He has not had any chest pain.  Past Medical History:  Diagnosis Date   Arthritis    "a little all over my body" (06/01/2018)   BPH (benign prostatic hyperplasia)    Chronic lower back pain    Coronary artery disease    GERD (gastroesophageal reflux disease)    History of BPH    Hyperlipidemia    Hypertension    Numbness    right arm   RLS (restless legs syndrome)    Sciatica    Ventral hernia    Vertigo     Past Surgical History:  Procedure Laterality Date   BACK SURGERY     CORONARY ANGIOPLASTY WITH STENT PLACEMENT  06/01/2018   CORONARY STENT INTERVENTION N/A 06/01/2018   Procedure: CORONARY STENT INTERVENTION;  Surgeon: Cody Prows, MD;  Location: [facility];  Service: Cardiovascular;  Laterality: N/A;   HAMMER TOE SURGERY Left 2012   JOINT REPLACEMENT     LEFT HEART CATH AND CORONARY ANGIOGRAPHY N/A 06/01/2018   Procedure: LEFT  HEART CATH AND CORONARY ANGIOGRAPHY;  Surgeon: Cody Prows, MD;  Location: [facility];  Service: Cardiovascular;  Laterality: N/A;   LUMBAR DISC SURGERY     "I had 2 other lower back ORs before the one in 2015"   LUMBAR LAMINECTOMY/DECOMPRESSION MICRODISCECTOMY N/A 05/29/2014   Procedure: CENTRAL LUMBAR LAMINECTOMY L3-4 and L4-5;  Surgeon: Jessy Oto, MD;  Location: [facility];  Service: Orthopedics;  Laterality: N/A;   TOTAL KNEE ARTHROPLASTY Left 03/31/2017   Procedure: LEFT TOTAL KNEE ARTHROPLASTY;  Surgeon: Meredith Pel, MD;  Location: [facility];  Service: Orthopedics;  Laterality: Left;   TOTAL KNEE ARTHROPLASTY Right 05/24/2019   Procedure: RIGHT TOTAL KNEE ARTHROPLASTY;  Surgeon: Meredith Pel, MD;  Location: [facility];  Service: Orthopedics;  Laterality: Right;   Social History   Tobacco Use   Smoking status: Never   Smokeless tobacco: Never  Substance Use Topics   Alcohol use: Not Currently    Comment: 06/01/2018  "probably have 3 beers/month"    Family History  Problem Relation Age of Onset   Osteoarthritis Mother     Review of Systems  Cardiovascular:  Positive for dyspnea on exertion. Negative for chest pain and leg swelling.  Musculoskeletal:  Positive for arthritis, back pain and muscle weakness.  Gastrointestinal:  Negative for melena.  Neurological:  Positive for loss of balance.  Objective:  Blood pressure 140/78, pulse 69, temperature 98.3 F (36.8 C), resp. rate 16, height 6' 1" (1.854 m), weight 240 lb (108.9 kg), SpO2 96 %. Body mass index is 31.66 kg/m.   Vitals with BMI 09/09/2021 07/31/2021 07/29/2021  Height 6' 1" 6' 1" 6' 1"  Weight 240 lbs 242 lbs 10 oz 241 lbs 13 oz  BMI 31.67 94.85 46.27  Systolic 035 009 381  Diastolic 78 82 82  Pulse 69 69 65      Physical Exam Vitals reviewed.  Constitutional:      Appearance: He is well-developed. He is obese.     Comments: Mildly obese  Neck:     Vascular: No carotid bruit or JVD.  Cardiovascular:      Rate and Rhythm: Normal rate and regular rhythm.     Pulses: Intact distal pulses.          Femoral pulses are 2+ on the right side with bruit and 2+ on the left side with bruit.      Popliteal pulses are 2+ on the right side and 2+ on the left side.       Dorsalis pedis pulses are 0 on the right side and 0 on the left side.       Posterior tibial pulses are 0 on the right side and 0 on the left side.     Heart sounds: Normal heart sounds.  Pulmonary:     Effort: Pulmonary effort is normal. No accessory muscle usage or respiratory distress.     Breath sounds: Normal breath sounds.  Abdominal:     General: Bowel sounds are normal.     Palpations: Abdomen is soft.  Musculoskeletal:     Right lower leg: No edema.     Left lower leg: Edema (trace. Venostasis changes noted) present.  Skin:    Capillary Refill: Capillary refill takes less than 2 seconds.   Radiology: No results found.  Laboratory examination:    CMP Latest Ref Rng & Units 06/19/2020 11/11/2019 11/10/2019  Glucose 70 - 99 mg/dL - 163(H) 139(H)  BUN 8 - 23 mg/dL - 28(H) 29(H)  Creatinine 0.61 - 1.24 mg/dL - 0.96 1.05  Sodium 135 - 145 mmol/L - 137 138  Potassium 3.5 - 5.1 mmol/L - 4.2 3.9  Chloride 98 - 111 mmol/L - 101 102  CO2 22 - 32 mmol/L - 26 24  Calcium 8.9 - 10.3 mg/dL - 8.2(L) 8.4(L)  Total Protein 6.0 - 8.5 g/dL 6.9 5.3(L) 5.5(L)  Total Bilirubin 0.3 - 1.2 mg/dL - 1.1 1.0  Alkaline Phos 38 - 126 U/L - 50 48  AST 15 - 41 U/L - 57(H) 65(H)  ALT 0 - 44 U/L - 71(H) 65(H)   CBC Latest Ref Rng & Units 11/11/2019 11/10/2019 11/09/2019  WBC 4.0 - 10.5 K/uL 6.6 6.4 8.1  Hemoglobin 13.0 - 17.0 g/dL 12.9(L) 13.2 12.6(L)  Hematocrit 39.0 - 52.0 % 39.2 38.9(L) 37.6(L)  Platelets 150 - 400 K/uL 113(L) 112(L) 122(L)   Lipid Panel  No results found for: CHOL, TRIG, HDL, CHOLHDL, VLDL, LDLCALC, LDLDIRECT HEMOGLOBIN A1C Lab Results  Component Value Date   HGBA1C 5.7 (H) 06/19/2020   TSH No results for input(s):  TSH in the last 8760 hours.   External labs:   07/06/2018: Cholesterol 120, triglycerides 104, HDL 37, LDL 62.  03/30/2018: eGFR 74, Creatinine 1.1, Potassium 4.9, BMP normal. Platelets  138, CBC otherwise normal. TSH 2.5. HB 14.0/HCT 41.9, platelets 138,000. Vitamin B12 373.  Labs 06/17/2021:  proBNP 152.  A1c 5.4%.  TSH normal.  Total cholesterol 179, triglycerides 165, HDL 36, LDL 111.  Non-HDL cholesterol 143.  Hb 14.3/HCT 42.7, platelets 156.  PRN Meds:. Medications Discontinued During This Encounter  Medication Reason   clopidogrel (PLAVIX) 75 MG tablet Completed Course   atorvastatin (LIPITOR) 20 MG tablet Reorder   No outpatient medications have been marked as taking for the 09/09/21 encounter (Office Visit) with Cody Prows, MD.    Cardiac Studies:   Coronary angiogram 06/01/2018: LM short normal. LAD prox to mid 80-99% S/P 2.5x40, 2.75x15 and 2.5x13 mm Orserio DES. OM1 80% S/P 2.5x13 mm Orserio DES. All stenosis reduced to 0%. Mid RCA 50%. Normal LV.  PCV MYOCARDIAL PERFUSION WITH LEXISCAN 08/12/2021  Narrative Lexiscan  Nuclear stress test 08/12/2021: Nondiagnostic ECG stress consistent with Lexiscan. Myocardial perfusion is abnormal. There is a small sized partially reversible (scar with ischemia) moderate defect in the apical region. There is a moderate sized reversible mild defect in the lateral, inferior and septal regions. Overall LV systolic function is normal with regional wall motion abnormalities. Stress LV EF: 59%. Compared to the study done on 08/08/2019, no significant change in ischemia and scar. Previously noted Moderate LV dilatation in both rest and stress is now only mildly dilated and EF has improved from 49%. Intermediate risk.   PCV ECHOCARDIOGRAM COMPLETE 08/12/2021  Narrative Echocardiogram 08/12/2021: Normal LV systolic function with EF 61%. Left ventricle cavity is normal in size. Moderate concentric remodeling of the left ventricle.  Normal global wall motion. Doppler evidence of grade I (impaired) diastolic dysfunction. Calculated EF 61%. Trileaflet aortic valve. No evidence of aortic stenosis. Mild to moderate aortic regurgitation. Mild calcification of the aortic valve annulus. Mild aortic valve leaflet calcification. Structurally normal tricuspid valve with trace regurgitation. No evidence of tricuspid valve stenosis. No evidence of pulmonary hypertension. No significant change since 08/22/2019.   EKG:  EKG 07/29/2021: Sinus rhythm with first-degree AV block at rate of 65 bpm, left anterior fascicular block, cannot exclude inferior infarct old, right bundle branch block.  Trifascicular block.  Low voltage complexes.  No significant change from 07/19/2019.  Assessment:     ICD-10-CM   1. Atherosclerosis of native coronary artery of native heart with stable angina pectoris (HCC)  I25.118 atorvastatin (LIPITOR) 20 MG tablet    2. Dyspnea on exertion  R06.09     3. Primary hypertension  I10     4. Orthostatic hypotension  I95.1     5. Hypercholesteremia  E78.00 atorvastatin (LIPITOR) 20 MG tablet     Meds ordered this encounter  Medications   atorvastatin (LIPITOR) 20 MG tablet    Sig: Take 1 tablet (20 mg total) by mouth daily.    Dispense:  100 tablet    Refill:  3    Medications Discontinued During This Encounter  Medication Reason   clopidogrel (PLAVIX) 75 MG tablet Completed Course   atorvastatin (LIPITOR) 20 MG tablet Reorder    Recommendations:   Cody Floyd  is a 81 y.o. male  With lumbar radiculopathy with gait imbalance, he uses a cane, GERD, hypertension, orthostatic hypotension, migraines, and hyperlipidemia, CAD s/p complex intervention to the proximal LAD implantation of 3 overlapping stents and also obtuse marginal 1 stenosis S/P drug-eluting stent placed on 06/01/2018.  He also has peripheral arterial disease with small vessel disease below the knee by Dopplers in 2020.  I reviewed the  results of the echocardiogram and stress test, there is low risk.  Suspect his dyspnea is related to deconditioning.  I reviewed his external labs, proBNP was also normal and again suggesting that this is not acute heart failure.  He will need continued rehab and probably consideration for outpatient physical therapy.  His blood pressure was elevated when he came in however due to occasional episodes of dizziness I did check his orthostatics today and is clearly orthostatic.  I do not want to be too aggressive with his blood pressure management.  His lipids are not at goal.  Patient has not been taking atorvastatin.  His refills probably ran out, I have sent for a whole year prescription for atorvastatin 20 mg daily.  Patient also states that he will bring all his medications this afternoon.  I will see him back in 6 months with repeat orthostatic blood pressure.  As he has been on dual antiplatelet therapy, advised him to discontinue Plavix and continue aspirin alone.  He is not on a beta-blocker, however he has underlying first-degree AV block, orthostatic hypotension and gait instability.  Hence I did not add a medication.  He is on low-dose ACE inhibitor, continue same.    Cody Prows, MD, [facility] 09/10/2021, 10:37 PM Office: [phone] Fax: [phone] Pager: [phone]

## 2021-09-10 ENCOUNTER — Telehealth: Payer: Self-pay

## 2021-09-10 NOTE — Telephone Encounter (Signed)
Stop Plavix, Start Atorvastatin 20 mg daily

## 2021-09-12 NOTE — Telephone Encounter (Signed)
Called pt no answer left a vm

## 2021-09-12 NOTE — Telephone Encounter (Signed)
Patients partner is aware and will let him know when she gets home in a few minutes.

## 2021-09-16 DIAGNOSIS — M961 Postlaminectomy syndrome, not elsewhere classified: Secondary | ICD-10-CM | POA: Diagnosis not present

## 2021-09-16 DIAGNOSIS — M47816 Spondylosis without myelopathy or radiculopathy, lumbar region: Secondary | ICD-10-CM | POA: Diagnosis not present

## 2021-09-16 DIAGNOSIS — M9983 Other biomechanical lesions of lumbar region: Secondary | ICD-10-CM | POA: Diagnosis not present

## 2021-10-02 DIAGNOSIS — M961 Postlaminectomy syndrome, not elsewhere classified: Secondary | ICD-10-CM | POA: Diagnosis not present

## 2021-11-28 ENCOUNTER — Encounter: Payer: Self-pay | Admitting: Student

## 2021-11-28 ENCOUNTER — Ambulatory Visit: Payer: Medicare Other | Admitting: Student

## 2021-11-28 VITALS — BP 158/78 | HR 74 | Temp 98.0°F | Resp 16 | Ht 73.0 in | Wt 244.0 lb

## 2021-11-28 DIAGNOSIS — E7800 Pure hypercholesterolemia, unspecified: Secondary | ICD-10-CM | POA: Diagnosis not present

## 2021-11-28 DIAGNOSIS — I739 Peripheral vascular disease, unspecified: Secondary | ICD-10-CM

## 2021-11-28 DIAGNOSIS — M7989 Other specified soft tissue disorders: Secondary | ICD-10-CM | POA: Diagnosis not present

## 2021-11-28 MED ORDER — FUROSEMIDE 40 MG PO TABS: 40.0000 mg | ORAL_TABLET | Freq: Every day | ORAL | 3 refills | Status: DC

## 2021-11-28 MED ORDER — POTASSIUM CHLORIDE CRYS ER 20 MEQ PO TBCR: 20.0000 meq | EXTENDED_RELEASE_TABLET | Freq: Every day | ORAL | 3 refills | Status: AC

## 2021-11-28 NOTE — Progress Notes (Signed)
? ?Primary Physician:  Deon Pilling, NP ? ? ?Patient ID: Cody Floyd, male    DOB: 1940/10/25, 81 y.o.   MRN: [id number] ? ?Subjective:  ? ? ?Chief Complaint  ?Patient presents with  ? Leg Swelling  ? ? ?Cody Floyd  is a 81 y.o. male  With lumbar radiculopathy with gait imbalance, he uses a cane, GERD, hypertension, orthostatic hypotension, migraines, and hyperlipidemia, CAD s/p complex intervention to the proximal LAD implantation of 3 overlapping stents and also obtuse marginal 1 stenosis S/P drug-eluting stent placed on 06/01/2018.  He also has peripheral arterial disease with small vessel disease below the knee by Dopplers in 2020. ? ?Patient was last seen in our office 09/09/2021 by Dr. Einar Gip at which time patient was stable from a cardiovascular standpoint and no changes were made.  In December patient underwent echocardiogram which was essentially normal with the exception of grade 1 diastolic dysfunction and low risk stress test.  Patient now presents today for urgent visit with complaints of bilateral leg swelling worsening over the last 1 week.  He denies chest pain, orthopnea, dyspnea.  Also notes that his legs have "felt weak for quite a a while".  He has known history of neuropathy and expresses feelings of paresthesia in bilateral feet.  ? ?Past Medical History:  ?Diagnosis Date  ? Arthritis   ? "a little all over my body" (06/01/2018)  ? BPH (benign prostatic hyperplasia)   ? Chronic lower back pain   ? Coronary artery disease   ? GERD (gastroesophageal reflux disease)   ? History of BPH   ? Hyperlipidemia   ? Hypertension   ? Numbness   ? right arm  ? RLS (restless legs syndrome)   ? Sciatica   ? Ventral hernia   ? Vertigo   ? ?Past Surgical History:  ?Procedure Laterality Date  ? BACK SURGERY    ? CORONARY ANGIOPLASTY WITH STENT PLACEMENT  06/01/2018  ? CORONARY STENT INTERVENTION N/A 06/01/2018  ? Procedure: CORONARY STENT INTERVENTION;  Surgeon: Adrian Prows, MD;  Location: [facility];   Service: Cardiovascular;  Laterality: N/A;  ? HAMMER TOE SURGERY Left 2012  ? JOINT REPLACEMENT    ? LEFT HEART CATH AND CORONARY ANGIOGRAPHY N/A 06/01/2018  ? Procedure: LEFT HEART CATH AND CORONARY ANGIOGRAPHY;  Surgeon: Adrian Prows, MD;  Location: [facility];  Service: Cardiovascular;  Laterality: N/A;  ? LUMBAR [place]    ? "I had 2 other lower back ORs before the one in 2015"  ? LUMBAR LAMINECTOMY/DECOMPRESSION MICRODISCECTOMY N/A 05/29/2014  ? Procedure: CENTRAL LUMBAR LAMINECTOMY L3-4 and L4-5;  Surgeon: Jessy Oto, MD;  Location: [facility];  Service: Orthopedics;  Laterality: N/A;  ? TOTAL KNEE ARTHROPLASTY Left 03/31/2017  ? Procedure: LEFT TOTAL KNEE ARTHROPLASTY;  Surgeon: Meredith Pel, MD;  Location: [facility];  Service: Orthopedics;  Laterality: Left;  ? TOTAL KNEE ARTHROPLASTY Right 05/24/2019  ? Procedure: RIGHT TOTAL KNEE ARTHROPLASTY;  Surgeon: Meredith Pel, MD;  Location: [facility];  Service: Orthopedics;  Laterality: Right;  ? ?Social History  ? ?Tobacco Use  ? Smoking status: Never  ? Smokeless tobacco: Never  ?Substance Use Topics  ? Alcohol use: Not Currently  ?  Comment: 06/01/2018  "probably have 3 beers/month"  ?  ?Family History  ?Problem Relation Age of Onset  ? Osteoarthritis Mother   ?  ?ROS  ? ?Review of Systems  ?Cardiovascular:  Positive for leg swelling (bilateral). Negative for  chest pain, dyspnea on exertion, orthopnea and paroxysmal nocturnal dyspnea.  ?Musculoskeletal:  Positive for arthritis, back pain and muscle weakness.  ?Gastrointestinal:  Negative for melena.  ?Neurological:  Positive for loss of balance.  ? ?Objective:  ?Blood pressure (!) 158/78, pulse 74, temperature 98 ?F (36.7 ?C), resp. rate 16, height _0  (1.854 m), weight 244 lb (110.7 kg), SpO2 95 %. Body mass index is 32.19 kg/m?.  ? ? ?  11/28/2021  ?  1:58 PM 09/09/2021  ?  8:54 AM 07/31/2021  ?  8:21 AM  ?Vitals with BMI  ?Height _1  _2  _3   ?Weight 244 lbs 240 lbs 242 lbs 10 oz  ?BMI 32.2 31.67  32.01  ?Systolic 093 818 299  ?Diastolic 78 78 82  ?Pulse 74 69 69  ?   ?Physical Exam ?Vitals reviewed.  ?Constitutional:   ?   Appearance: He is well-developed. He is obese.  ?   Comments: Mildly obese  ?Neck:  ?   Vascular: No carotid bruit or JVD.  ?Cardiovascular:  ?   Rate and Rhythm: Normal rate and regular rhythm.  ?   Pulses: Intact distal pulses.     ?     Femoral pulses are 2+ on the right side with bruit and 2+ on the left side with bruit. ?     Popliteal pulses are 2+ on the right side and 2+ on the left side.  ?     Dorsalis pedis pulses are 0 on the right side and 0 on the left side.  ?     Posterior tibial pulses are 0 on the right side and 0 on the left side.  ?   Heart sounds: Normal heart sounds.  ?Pulmonary:  ?   Effort: Pulmonary effort is normal. No accessory muscle usage or respiratory distress.  ?   Breath sounds: Normal breath sounds.  ?Musculoskeletal:  ?   Right lower leg: Edema (1-2+ pitting) present.  ?   Left lower leg: Edema (Venostasis changes noted. 1-2 + pitting) present.  ? ?Laboratory examination:  ? ? ? ?  Latest Ref Rng & Units 06/19/2020  ?  4:29 PM 11/11/2019  ?  2:14 AM 11/10/2019  ?  6:16 AM  ?CMP  ?Glucose 70 - 99 mg/dL  163   139    ?BUN 8 - 23 mg/dL  28   29    ?Creatinine 0.61 - 1.24 mg/dL  0.96   1.05    ?Sodium 135 - 145 mmol/L  137   138    ?Potassium 3.5 - 5.1 mmol/L  4.2   3.9    ?Chloride 98 - 111 mmol/L  101   102    ?CO2 22 - 32 mmol/L  26   24    ?Calcium 8.9 - 10.3 mg/dL  8.2   8.4    ?Total Protein 6.0 - 8.5 g/dL 6.9   5.3   5.5    ?Total Bilirubin 0.3 - 1.2 mg/dL  1.1   1.0    ?Alkaline Phos 38 - 126 U/L  50   48    ?AST 15 - 41 U/L  57   65    ?ALT 0 - 44 U/L  71   65    ? ? ?  Latest Ref Rng & Units 11/11/2019  ?  2:14 AM 11/10/2019  ?  6:16 AM 11/09/2019  ?  2:25 AM  ?CBC  ?WBC 4.0 - 10.5 K/uL 6.6  6.4   8.1    ?Hemoglobin 13.0 - 17.0 g/dL 12.9   13.2   12.6    ?Hematocrit 39.0 - 52.0 % 39.2   38.9   37.6    ?Platelets 150 - 400 K/uL 113   112   122     ? ?Lipid Panel  ?No results found for: CHOL, TRIG, HDL, CHOLHDL, VLDL, LDLCALC, LDLDIRECT ?HEMOGLOBIN A1C ?Lab Results  ?Component Value Date  ? HGBA1C 5.7 (H) 06/19/2020  ? ?TSH ?No results for input(s): TSH in the last 8760 hours.  ? ?External labs: ?07/06/2018: Cholesterol 120, triglycerides 104, HDL 37, LDL 62. ? ?03/30/2018: eGFR 74, Creatinine 1.1, Potassium 4.9, BMP normal. Platelets 138, CBC otherwise normal. TSH 2.5. HB 14.0/HCT 41.9, platelets 138,000. Vitamin B12 373. ? ?Labs 06/17/2021: ? ?proBNP 152. ? ?A1c 5.4%.  TSH normal. ? ?Total cholesterol 179, triglycerides 165, HDL 36, LDL 111.  Non-HDL cholesterol 143. ? ?Hb 14.3/HCT 42.7, platelets 156. ? ?Allergies  ?No Known Allergies  ? ?Medications Prior to Visit:  ? ?Outpatient Medications Prior to Visit  ?Medication Sig Dispense Refill  ? albuterol (VENTOLIN HFA) 108 (90 Base) MCG/ACT inhaler Inhale 2 puffs into the lungs every 2 (two) hours as needed for wheezing or shortness of breath. 6.7 g 0  ? aspirin EC 81 MG tablet Take 81 mg by mouth daily.    ? atorvastatin (LIPITOR) 20 MG tablet Take 1 tablet (20 mg total) by mouth daily. 100 tablet 3  ? clopidogrel (PLAVIX) 75 MG tablet Take 75 mg by mouth daily.    ? fluticasone (FLONASE) 50 MCG/ACT nasal spray Place 2 sprays into both nostrils daily. 9.9 mL 0  ? lisinopril (ZESTRIL) 10 MG tablet Take 10 mg by mouth daily.    ? MAGNESIUM PO Take 1 tablet by mouth daily.    ? meclizine (ANTIVERT) 25 MG tablet Take 25 mg by mouth 3 (three) times daily as needed for dizziness.    ? Multiple Vitamins-Minerals (ZINC PO) Take 1 tablet by mouth daily.    ? nitroGLYCERIN (NITROSTAT) 0.4 MG SL tablet Place 0.4 mg under the tongue every 5 (five) minutes as needed for chest pain.    ? omeprazole (PRILOSEC) 20 MG capsule Take 20 mg by mouth daily.    ? pramipexole (MIRAPEX) 0.5 MG tablet TAKE 1 TABLET(0.5 MG) BY MOUTH AT BEDTIME 90 tablet 0  ? terazosin (HYTRIN) 5 MG capsule Take 5 mg by mouth at bedtime.    ? ?No  facility-administered medications prior to visit.  ? ?Final Medications at End of Visit   ? ?Current Meds  ?Medication Sig  ? albuterol (VENTOLIN HFA) 108 (90 Base) MCG/ACT inhaler Inhale 2 puffs into the lungs every 2

## 2021-12-04 ENCOUNTER — Other Ambulatory Visit: Payer: Medicare Other

## 2021-12-06 DIAGNOSIS — M7989 Other specified soft tissue disorders: Secondary | ICD-10-CM | POA: Diagnosis not present

## 2021-12-06 DIAGNOSIS — R600 Localized edema: Secondary | ICD-10-CM | POA: Diagnosis not present

## 2021-12-07 LAB — BASIC METABOLIC PANEL
BUN/Creatinine Ratio: 23 (ref 10–24)
BUN: 26 mg/dL (ref 8–27)
CO2: 21 mmol/L (ref 20–29)
Calcium: 9.7 mg/dL (ref 8.6–10.2)
Chloride: 101 mmol/L (ref 96–106)
Creatinine, Ser: 1.13 mg/dL (ref 0.76–1.27)
Glucose: 112 mg/dL — ABNORMAL HIGH (ref 70–99)
Potassium: 4.9 mmol/L (ref 3.5–5.2)
Sodium: 138 mmol/L (ref 134–144)
eGFR: 65 mL/min/{1.73_m2} (ref >59)

## 2021-12-07 LAB — BRAIN NATRIURETIC PEPTIDE: BNP: 48.6 pg/mL (ref 0.0–100.0)

## 2021-12-12 NOTE — Progress Notes (Deleted)
Primary Physician:  Deon Pilling, NP   Patient ID: Cody Floyd, male    DOB: June 26, 1941, 81 y.o.   MRN: [id number]  Subjective:    No chief complaint on file.   Cody Floyd  is a 81 y.o. male  With lumbar radiculopathy with gait imbalance, he uses a cane, GERD, hypertension, orthostatic hypotension, migraines, and hyperlipidemia, CAD s/p complex intervention to the proximal LAD implantation of 3 overlapping stents and also obtuse marginal 1 stenosis S/P drug-eluting stent placed on 06/01/2018.  He also has peripheral arterial disease with small vessel disease below the knee by Dopplers in 2020.  Patient presents for 2-week follow-up.  Given bilateral leg edema last office visit started patient on Lasix 40 mg daily, repeat BMP remained stable and BNP was normal.  Also last office visit given complaints of lower extremity paresthesias ordered lower arterial duplex, which unfortunately has not been done yet. ***  ***HTN?  Patient was last seen in our office 09/09/2021 by Dr. Einar Gip at which time patient was stable from a cardiovascular standpoint and no changes were made.  In December patient underwent echocardiogram which was essentially normal with the exception of grade 1 diastolic dysfunction and low risk stress test.  Patient now presents today for urgent visit with complaints of bilateral leg swelling worsening over the last 1 week.  He denies chest pain, orthopnea, dyspnea.  Also notes that his legs have "felt weak for quite a a while".  He has known history of neuropathy and expresses feelings of paresthesia in bilateral feet.   Past Medical History:  Diagnosis Date   Arthritis    "a little all over my body" (06/01/2018)   BPH (benign prostatic hyperplasia)    Chronic lower back pain    Coronary artery disease    GERD (gastroesophageal reflux disease)    History of BPH    Hyperlipidemia    Hypertension    Numbness    right arm   RLS (restless legs syndrome)    Sciatica     Ventral hernia    Vertigo    Past Surgical History:  Procedure Laterality Date   BACK SURGERY     CORONARY ANGIOPLASTY WITH STENT PLACEMENT  06/01/2018   CORONARY STENT INTERVENTION N/A 06/01/2018   Procedure: CORONARY STENT INTERVENTION;  Surgeon: Adrian Prows, MD;  Location: [facility];  Service: Cardiovascular;  Laterality: N/A;   HAMMER TOE SURGERY Left 2012   JOINT REPLACEMENT     LEFT HEART CATH AND CORONARY ANGIOGRAPHY N/A 06/01/2018   Procedure: LEFT HEART CATH AND CORONARY ANGIOGRAPHY;  Surgeon: Adrian Prows, MD;  Location: [facility];  Service: Cardiovascular;  Laterality: N/A;   LUMBAR DISC SURGERY     "I had 2 other lower back ORs before the one in 2015"   LUMBAR LAMINECTOMY/DECOMPRESSION MICRODISCECTOMY N/A 05/29/2014   Procedure: CENTRAL LUMBAR LAMINECTOMY L3-4 and L4-5;  Surgeon: Jessy Oto, MD;  Location: [place];  Service: Orthopedics;  Laterality: N/A;   TOTAL KNEE ARTHROPLASTY Left 03/31/2017   Procedure: LEFT TOTAL KNEE ARTHROPLASTY;  Surgeon: Meredith Pel, MD;  Location: [place];  Service: Orthopedics;  Laterality: Left;   TOTAL KNEE ARTHROPLASTY Right 05/24/2019   Procedure: RIGHT TOTAL KNEE ARTHROPLASTY;  Surgeon: Meredith Pel, MD;  Location: [place];  Service: Orthopedics;  Laterality: Right;   Social History   Tobacco Use   Smoking status: Never   Smokeless tobacco: Never  Substance Use Topics   Alcohol  use: Not Currently    Comment: 06/01/2018  "probably have 3 beers/month"    Family History  Problem Relation Age of Onset   Osteoarthritis Mother     ROS   Review of Systems  Cardiovascular:  Positive for leg swelling (bilateral). Negative for chest pain, dyspnea on exertion, orthopnea and paroxysmal nocturnal dyspnea.  Musculoskeletal:  Positive for arthritis, back pain and muscle weakness.  Gastrointestinal:  Negative for melena.  Neurological:  Positive for loss of balance.   Objective:  There were no vitals taken for this visit.  There is no height or weight on file to calculate BMI.      11/28/2021    1:58 PM 09/09/2021    8:54 AM 07/31/2021    8:21 AM  Vitals with BMI  Height _0  _1  _2   Weight 244 lbs 240 lbs 242 lbs 10 oz  BMI 32.2 76.16 07.37  Systolic 106 269 485  Diastolic 78 78 82  Pulse 74 69 69     Physical Exam Vitals reviewed.  Constitutional:      Appearance: He is well-developed. He is obese.     Comments: Mildly obese  Neck:     Vascular: No carotid bruit or JVD.  Cardiovascular:     Rate and Rhythm: Normal rate and regular rhythm.     Pulses: Intact distal pulses.          Femoral pulses are 2+ on the right side with bruit and 2+ on the left side with bruit.      Popliteal pulses are 2+ on the right side and 2+ on the left side.       Dorsalis pedis pulses are 0 on the right side and 0 on the left side.       Posterior tibial pulses are 0 on the right side and 0 on the left side.     Heart sounds: Normal heart sounds.  Pulmonary:     Effort: Pulmonary effort is normal. No accessory muscle usage or respiratory distress.     Breath sounds: Normal breath sounds.  Musculoskeletal:     Right lower leg: Edema (1-2+ pitting) present.     Left lower leg: Edema (Venostasis changes noted. 1-2 + pitting) present.   Laboratory examination:       Latest Ref Rng & Units 12/06/2021    8:59 AM 06/19/2020    4:29 PM 11/11/2019    2:14 AM  CMP  Glucose 70 - 99 mg/dL 112    163    BUN 8 - 27 mg/dL 26    28    Creatinine 0.76 - 1.27 mg/dL 1.13    0.96    Sodium 134 - 144 mmol/L 138    137    Potassium 3.5 - 5.2 mmol/L 4.9    4.2    Chloride 96 - 106 mmol/L 101    101    CO2 20 - 29 mmol/L 21    26    Calcium 8.6 - 10.2 mg/dL 9.7    8.2    Total Protein 6.0 - 8.5 g/dL  6.9   5.3    Total Bilirubin 0.3 - 1.2 mg/dL   1.1    Alkaline Phos 38 - 126 U/L   50    AST 15 - 41 U/L   57    ALT 0 - 44 U/L   71        Latest Ref Rng & Units 11/11/2019  2:14 AM 11/10/2019    6:16 AM 11/09/2019     2:25 AM  CBC  WBC 4.0 - 10.5 K/uL 6.6   6.4   8.1    Hemoglobin 13.0 - 17.0 g/dL 12.9   13.2   12.6    Hematocrit 39.0 - 52.0 % 39.2   38.9   37.6    Platelets 150 - 400 K/uL 113   112   122     Lipid Panel  No results found for: CHOL, TRIG, HDL, CHOLHDL, VLDL, LDLCALC, LDLDIRECT HEMOGLOBIN A1C Lab Results  Component Value Date   HGBA1C 5.7 (H) 06/19/2020   TSH No results for input(s): TSH in the last 8760 hours.   External labs: 07/06/2018: Cholesterol 120, triglycerides 104, HDL 37, LDL 62.  03/30/2018: eGFR 74, Creatinine 1.1, Potassium 4.9, BMP normal. Platelets 138, CBC otherwise normal. TSH 2.5. HB 14.0/HCT 41.9, platelets 138,000. Vitamin B12 373.  Labs 06/17/2021:  proBNP 152.  A1c 5.4%.  TSH normal.  Total cholesterol 179, triglycerides 165, HDL 36, LDL 111.  Non-HDL cholesterol 143.  Hb 14.3/HCT 42.7, platelets 156.  Allergies  No Known Allergies   Medications Prior to Visit:   Outpatient Medications Prior to Visit  Medication Sig Dispense Refill   albuterol (VENTOLIN HFA) 108 (90 Base) MCG/ACT inhaler Inhale 2 puffs into the lungs every 2 (two) hours as needed for wheezing or shortness of breath. 6.7 g 0   aspirin EC 81 MG tablet Take 81 mg by mouth daily.     atorvastatin (LIPITOR) 20 MG tablet Take 1 tablet (20 mg total) by mouth daily. 100 tablet 3   clopidogrel (PLAVIX) 75 MG tablet Take 75 mg by mouth daily.     fluticasone (FLONASE) 50 MCG/ACT nasal spray Place 2 sprays into both nostrils daily. 9.9 mL 0   furosemide (LASIX) 40 MG tablet Take 1 tablet (40 mg total) by mouth daily. 30 tablet 3   lisinopril (ZESTRIL) 10 MG tablet Take 10 mg by mouth daily.     MAGNESIUM PO Take 1 tablet by mouth daily.     meclizine (ANTIVERT) 25 MG tablet Take 25 mg by mouth 3 (three) times daily as needed for dizziness.     Multiple Vitamins-Minerals (ZINC PO) Take 1 tablet by mouth daily.     nitroGLYCERIN (NITROSTAT) 0.4 MG SL tablet Place 0.4 mg under the tongue  every 5 (five) minutes as needed for chest pain.     omeprazole (PRILOSEC) 20 MG capsule Take 20 mg by mouth daily.     potassium chloride SA (KLOR-CON M20) 20 MEQ tablet Take 1 tablet (20 mEq total) by mouth daily. 30 tablet 3   pramipexole (MIRAPEX) 0.5 MG tablet TAKE 1 TABLET(0.5 MG) BY MOUTH AT BEDTIME 90 tablet 0   terazosin (HYTRIN) 5 MG capsule Take 5 mg by mouth at bedtime.     No facility-administered medications prior to visit.   Final Medications at End of Visit    No outpatient medications have been marked as taking for the 12/13/21 encounter (Appointment) with Rayetta Pigg, Reeva Davern C, PA-C.   Radiology:  No results found.  Cardiac Studies:   Coronary angiogram 06/01/2018: LM short normal. LAD prox to mid 80-99% S/P 2.5x40, 2.75x15 and 2.5x13 mm Orserio DES. OM1 80% S/P 2.5x13 mm Orserio DES. All stenosis reduced to 0%. Mid RCA 50%. Normal LV.  PCV MYOCARDIAL PERFUSION WITH LEXISCAN 08/12/2021 Nondiagnostic ECG stress consistent with Lexiscan. Myocardial perfusion is abnormal. There is a small sized partially reversible (  scar with ischemia) moderate defect in the apical region. There is a moderate sized reversible mild defect in the lateral, inferior and septal regions. Overall LV systolic function is normal with regional wall motion abnormalities. Stress LV EF: 59%. Compared to the study done on 08/08/2019, no significant change in ischemia and scar. Previously noted Moderate LV dilatation in both rest and stress is now only mildly dilated and EF has improved from 49%. Intermediate risk.   PCV ECHOCARDIOGRAM COMPLETE 30/12/1100 Normal LV systolic function with EF 61%. Left ventricle cavity is normal in size. Moderate concentric remodeling of the left ventricle. Normal global wall motion. Doppler evidence of grade I (impaired) diastolic dysfunction. Calculated EF 61%. Trileaflet aortic valve. No evidence of aortic stenosis. Mild to moderate aortic regurgitation. Mild calcification  of the aortic valve annulus. Mild aortic valve leaflet calcification. Structurally normal tricuspid valve with trace regurgitation. No evidence of tricuspid valve stenosis. No evidence of pulmonary hypertension. No significant change since 08/22/2019.   EKG  11/28/2021: Sinus rhythm with first-degree AV block at a rate of 75 bpm.  Left axis, left anterior fascicular block.  Right bundle branch block.  Low voltage complexes.  Compared EKG 07/29/2021, no significant change.  EKG 07/29/2021: Sinus rhythm with first-degree AV block at rate of 65 bpm, left anterior fascicular block, cannot exclude inferior infarct old, right bundle branch block.  Trifascicular block.  Low voltage complexes.  No significant change from 07/19/2019.  Assessment:   No diagnosis found.  No orders of the defined types were placed in this encounter.   There are no discontinued medications.   Recommendations:   CHINEDU AGUSTIN  is a 81 y.o. male  With lumbar radiculopathy with gait imbalance, he uses a cane, GERD, hypertension, orthostatic hypotension, migraines, and hyperlipidemia, CAD s/p complex intervention to the proximal LAD implantation of 3 overlapping stents and also obtuse marginal 1 stenosis S/P drug-eluting stent placed on 06/01/2018.  He also has peripheral arterial disease with small vessel disease below the knee by Dopplers in 2020.  Patient presents for 2-week follow-up.  Given bilateral leg edema last office visit started patient on Lasix 40 mg daily, repeat BMP remained stable and BNP was normal.  Also last office visit given complaints of lower extremity paresthesias ordered lower arterial duplex, which unfortunately has not been done yet. ***  ***  Patient had recent echo and stress done which were unremarkable with the exception of grade 1 diastolic dysfunction and low risk stress test.  Given patient's bilateral leg edema we will start him on Lasix 40 mg once daily with potassium supplement and repeat BMP  in BNP in 1 week.  Also given his complaints of paresthesias will obtain lower extremity arterial duplex as he has a known history of PAD.  Patient's blood pressure is elevated today, will not make changes to antihypertensive medication at this time but rather we will reevaluate at next office visit.  He is not on a beta-blocker, however he has underlying first-degree AV block, orthostatic hypotension and gait instability.  Hence I did not add a medication.  He is on low-dose ACE inhibitor, continue same.  Follow-up in 2 weeks, sooner if needed.   Alethia Berthold, PA-C 12/12/2021, 1:32 PM Office: [phone]

## 2021-12-13 ENCOUNTER — Ambulatory Visit: Payer: Medicare Other | Admitting: Student

## 2021-12-17 ENCOUNTER — Ambulatory Visit: Payer: Medicare Other

## 2021-12-17 DIAGNOSIS — E7800 Pure hypercholesterolemia, unspecified: Secondary | ICD-10-CM

## 2021-12-17 DIAGNOSIS — I739 Peripheral vascular disease, unspecified: Secondary | ICD-10-CM

## 2021-12-17 DIAGNOSIS — M7989 Other specified soft tissue disorders: Secondary | ICD-10-CM

## 2021-12-19 NOTE — Progress Notes (Signed)
? ?Primary Physician:  Deon Pilling, NP ? ? ?Patient ID: Cody Floyd, male    DOB: [birth year], 81 y.o.   MRN: [id number] ? ?Subjective:  ? ? ?Chief Complaint  ?Patient presents with  ? Coronary Artery Disease  ? Follow-up  ?  2 week  ? ? ?Cody Floyd  is a 81 y.o. male  with lumbar radiculopathy with gait imbalance, he uses a cane, GERD, hypertension, orthostatic hypotension, migraines, and hyperlipidemia, CAD s/p complex intervention to the proximal LAD implantation of 3 overlapping stents and also obtuse marginal 1 stenosis S/P drug-eluting stent placed on 06/01/2018.  He also has peripheral arterial disease with small vessel disease below the knee by Dopplers in 2020. ? ?Patient presents for 2-week follow-up.  Given bilateral leg edema last office visit started patient on Lasix 40 mg daily, repeat BMP remained stable and BNP was normal.  Also last office visit given complaints of lower extremity paresthesias ordered lower arterial duplex, which Dr. Einar Gip reviewed and reports as fairly stable compared to previous in 2020.  Patient noticed minimal improvement of bilateral leg edema with Lasix.  Upon further questioning patient admits to very high sodium intake in his diet.  ? ?Notably he has known history of neuropathy and expresses feelings of paresthesia in bilateral feet.   ? ?Past Medical History:  ?Diagnosis Date  ? Arthritis   ? "a little all over my body" (06/01/2018)  ? BPH (benign prostatic hyperplasia)   ? Chronic lower back pain   ? Coronary artery disease   ? GERD (gastroesophageal reflux disease)   ? History of BPH   ? Hyperlipidemia   ? Hypertension   ? Numbness   ? right arm  ? RLS (restless legs syndrome)   ? Sciatica   ? Ventral hernia   ? Vertigo   ? ?Past Surgical History:  ?Procedure Laterality Date  ? BACK SURGERY    ? CORONARY ANGIOPLASTY WITH STENT PLACEMENT  06/01/2018  ? CORONARY STENT INTERVENTION N/A 06/01/2018  ? Procedure: CORONARY STENT INTERVENTION;  Surgeon: Adrian Prows, MD;  Location:  [facility];  Service: Cardiovascular;  Laterality: N/A;  ? HAMMER TOE SURGERY Left 2012  ? JOINT REPLACEMENT    ? LEFT HEART CATH AND CORONARY ANGIOGRAPHY N/A 06/01/2018  ? Procedure: LEFT HEART CATH AND CORONARY ANGIOGRAPHY;  Surgeon: Adrian Prows, MD;  Location: [place];  Service: Cardiovascular;  Laterality: N/A;  ? LUMBAR [place]    ? "I had 2 other lower back ORs before the one in 2015"  ? LUMBAR LAMINECTOMY/DECOMPRESSION MICRODISCECTOMY N/A 05/29/2014  ? Procedure: CENTRAL LUMBAR LAMINECTOMY L3-4 and L4-5;  Surgeon: Jessy Oto, MD;  Location: [place];  Service: Orthopedics;  Laterality: N/A;  ? TOTAL KNEE ARTHROPLASTY Left 03/31/2017  ? Procedure: LEFT TOTAL KNEE ARTHROPLASTY;  Surgeon: Meredith Pel, MD;  Location: [place];  Service: Orthopedics;  Laterality: Left;  ? TOTAL KNEE ARTHROPLASTY Right 05/24/2019  ? Procedure: RIGHT TOTAL KNEE ARTHROPLASTY;  Surgeon: Meredith Pel, MD;  Location: [place];  Service: Orthopedics;  Laterality: Right;  ? ?Social History  ? ?Tobacco Use  ? Smoking status: Never  ? Smokeless tobacco: Never  ?Substance Use Topics  ? Alcohol use: Not Currently  ?  Comment: 06/01/2018  "probably have 3 beers/month"  ?  ?Family History  ?Problem Relation Age of Onset  ? Osteoarthritis Mother   ?  ?ROS  ? ?Review of Systems  ?Cardiovascular:  Positive for leg  swelling (bilateral, stable). Negative for chest pain, dyspnea on exertion, orthopnea and paroxysmal nocturnal dyspnea.  ?Musculoskeletal:  Positive for arthritis, back pain and muscle weakness.  ?Gastrointestinal:  Negative for melena.  ?Neurological:  Positive for loss of balance.  ? ?Objective:  ?Blood pressure (!) 150/69, pulse 60, temperature 98.3 ?F (36.8 ?C), temperature source Temporal, resp. rate 16, height _0  (1.854 m), weight 241 lb (109.3 kg), SpO2 96 %. Body mass index is 31.8 kg/m?.  ? ? ?  12/20/2021  ?  9:30 AM 12/20/2021  ?  9:21 AM 11/28/2021  ?  1:58 PM  ?Vitals with BMI  ?Height  _1  _2    ?Weight  241 lbs 244 lbs  ?BMI  31.8 32.2  ?Systolic 188 416 606  ?Diastolic 69 73 78  ?Pulse 60 60 74  ?   ?Physical Exam ?Vitals reviewed.  ?Constitutional:   ?   Appearance: He is well-developed. He is obese.  ?   Comments: Mildly obese  ?Neck:  ?   Vascular: No carotid bruit or JVD.  ?Cardiovascular:  ?   Rate and Rhythm: Normal rate and regular rhythm.  ?   Pulses: Intact distal pulses.     ?     Femoral pulses are 2+ on the right side with bruit and 2+ on the left side with bruit. ?     Popliteal pulses are 2+ on the right side and 2+ on the left side.  ?     Dorsalis pedis pulses are 0 on the right side and 0 on the left side.  ?     Posterior tibial pulses are 0 on the right side and 0 on the left side.  ?   Heart sounds: Normal heart sounds.  ?Pulmonary:  ?   Effort: Pulmonary effort is normal. No accessory muscle usage or respiratory distress.  ?   Breath sounds: Normal breath sounds.  ?Musculoskeletal:  ?   Right lower leg: Edema (1+ pitting) present.  ?   Left lower leg: Edema (Venostasis changes noted. 1-2 + pitting) present.  ? ?Laboratory examination:  ? ? ? ?  Latest Ref Rng & Units 12/06/2021  ?  8:59 AM 06/19/2020  ?  4:29 PM 11/11/2019  ?  2:14 AM  ?CMP  ?Glucose 70 - 99 mg/dL 112    163    ?BUN 8 - 27 mg/dL 26    28    ?Creatinine 0.76 - 1.27 mg/dL 1.13    0.96    ?Sodium 134 - 144 mmol/L 138    137    ?Potassium 3.5 - 5.2 mmol/L 4.9    4.2    ?Chloride 96 - 106 mmol/L 101    101    ?CO2 20 - 29 mmol/L 21    26    ?Calcium 8.6 - 10.2 mg/dL 9.7    8.2    ?Total Protein 6.0 - 8.5 g/dL  6.9   5.3    ?Total Bilirubin 0.3 - 1.2 mg/dL   1.1    ?Alkaline Phos 38 - 126 U/L   50    ?AST 15 - 41 U/L   57    ?ALT 0 - 44 U/L   71    ? ? ?  Latest Ref Rng & Units 11/11/2019  ?  2:14 AM 11/10/2019  ?  6:16 AM 11/09/2019  ?  2:25 AM  ?CBC  ?WBC 4.0 - 10.5 K/uL 6.6   6.4   8.1    ?  Hemoglobin 13.0 - 17.0 g/dL 12.9   13.2   12.6    ?Hematocrit 39.0 - 52.0 % 39.2   38.9   37.6    ?Platelets 150 - 400 K/uL 113   112    122    ? ?Lipid Panel  ?No results found for: CHOL, TRIG, HDL, CHOLHDL, VLDL, LDLCALC, LDLDIRECT ?HEMOGLOBIN A1C ?Lab Results  ?Component Value Date  ? HGBA1C 5.7 (H) 06/19/2020  ? ?TSH ?No results for input(s): TSH in the last 8760 hours.  ? ?External labs: ?07/06/2018: Cholesterol 120, triglycerides 104, HDL 37, LDL 62. ? ?03/30/2018: eGFR 74, Creatinine 1.1, Potassium 4.9, BMP normal. Platelets 138, CBC otherwise normal. TSH 2.5. HB 14.0/HCT 41.9, platelets 138,000. Vitamin B12 373. ? ?Labs 06/17/2021: ? ?proBNP 152. ? ?A1c 5.4%.  TSH normal. ? ?Total cholesterol 179, triglycerides 165, HDL 36, LDL 111.  Non-HDL cholesterol 143. ? ?Hb 14.3/HCT 42.7, platelets 156. ? ?Allergies  ?No Known Allergies  ? ?Medications Prior to Visit:  ? ?Outpatient Medications Prior to Visit  ?Medication Sig Dispense Refill  ? aspirin EC 81 MG tablet Take 81 mg by mouth daily.    ? atorvastatin (LIPITOR) 20 MG tablet Take 1 tablet (20 mg total) by mouth daily. 100 tablet 3  ? fluticasone (FLONASE) 50 MCG/ACT nasal spray Place 2 sprays into both nostrils daily. 9.9 mL 0  ? lisinopril (ZESTRIL) 10 MG tablet Take 10 mg by mouth daily.    ? MAGNESIUM PO Take 1 tablet by mouth daily.    ? meclizine (ANTIVERT) 25 MG tablet Take 25 mg by mouth 3 (three) times daily as needed for dizziness.    ? nitroGLYCERIN (NITROSTAT) 0.4 MG SL tablet Place 0.4 mg under the tongue every 5 (five) minutes as needed for chest pain.    ? omeprazole (PRILOSEC) 20 MG capsule Take 20 mg by mouth daily.    ? potassium chloride SA (KLOR-CON M20) 20 MEQ tablet Take 1 tablet (20 mEq total) by mouth daily. 30 tablet 3  ? pramipexole (MIRAPEX) 0.5 MG tablet TAKE 1 TABLET(0.5 MG) BY MOUTH AT BEDTIME 90 tablet 0  ? terazosin (HYTRIN) 5 MG capsule Take 5 mg by mouth at bedtime.    ? furosemide (LASIX) 40 MG tablet Take 1 tablet (40 mg total) by mouth daily. 30 tablet 3  ? albuterol (VENTOLIN HFA) 108 (90 Base) MCG/ACT inhaler Inhale 2 puffs into the lungs every 2 (two)  hours as needed for wheezing or shortness of breath. (Patient not taking: Reported on 12/20/2021) 6.7 g 0  ? clopidogrel (PLAVIX) 75 MG tablet Take 75 mg by mouth daily. (Patient not taking: Reported on 4/2

## 2021-12-20 ENCOUNTER — Ambulatory Visit: Payer: Medicare Other | Admitting: Student

## 2021-12-20 ENCOUNTER — Encounter: Payer: Self-pay | Admitting: Student

## 2021-12-20 VITALS — BP 150/69 | HR 60 | Temp 98.3°F | Resp 16 | Ht 73.0 in | Wt 241.0 lb

## 2021-12-20 DIAGNOSIS — I25118 Atherosclerotic heart disease of native coronary artery with other forms of angina pectoris: Secondary | ICD-10-CM | POA: Diagnosis not present

## 2021-12-20 DIAGNOSIS — I739 Peripheral vascular disease, unspecified: Secondary | ICD-10-CM | POA: Diagnosis not present

## 2021-12-20 DIAGNOSIS — M7989 Other specified soft tissue disorders: Secondary | ICD-10-CM | POA: Diagnosis not present

## 2021-12-20 MED ORDER — CILOSTAZOL 100 MG PO TABS: 100.0000 mg | ORAL_TABLET | Freq: Two times a day (BID) | ORAL | 3 refills | Status: DC

## 2022-03-12 ENCOUNTER — Ambulatory Visit: Payer: Medicare Other | Admitting: Cardiology

## 2022-03-12 ENCOUNTER — Encounter: Payer: Self-pay | Admitting: Cardiology

## 2022-03-12 VITALS — BP 194/87 | HR 64 | Temp 98.0°F | Resp 16 | Ht 73.0 in | Wt 245.8 lb

## 2022-03-12 DIAGNOSIS — E7800 Pure hypercholesterolemia, unspecified: Secondary | ICD-10-CM | POA: Diagnosis not present

## 2022-03-12 DIAGNOSIS — I25118 Atherosclerotic heart disease of native coronary artery with other forms of angina pectoris: Secondary | ICD-10-CM | POA: Diagnosis not present

## 2022-03-12 DIAGNOSIS — I10 Essential (primary) hypertension: Secondary | ICD-10-CM | POA: Diagnosis not present

## 2022-03-12 DIAGNOSIS — R0989 Other specified symptoms and signs involving the circulatory and respiratory systems: Secondary | ICD-10-CM

## 2022-03-12 DIAGNOSIS — I951 Orthostatic hypotension: Secondary | ICD-10-CM

## 2022-03-12 MED ORDER — EZETIMIBE 10 MG PO TABS: 10.0000 mg | ORAL_TABLET | Freq: Every evening | ORAL | 3 refills | Status: DC

## 2022-03-12 MED ORDER — LISINOPRIL 40 MG PO TABS: 40.0000 mg | ORAL_TABLET | Freq: Every day | ORAL | 2 refills | Status: DC

## 2022-03-12 MED ORDER — CHLORTHALIDONE 25 MG PO TABS: 25.0000 mg | ORAL_TABLET | ORAL | 2 refills | Status: DC

## 2022-03-12 NOTE — Progress Notes (Signed)
Primary Physician:  Deon Pilling, NP   Patient ID: Cody Floyd, male    DOB: 1941/08/20, 81 y.o.   MRN: [id number]  Subjective:    Chief Complaint  Patient presents with   Coronary Artery Disease   Hypertension   Orthostatic Hypotension   Follow-up    6 months    [name]  is a 81 y.o. male  with lumbar radiculopathy with gait imbalance, he uses a cane, GERD, hypertension, orthostatic hypotension, migraines, and hyperlipidemia, CAD s/p complex intervention to the proximal LAD implantation of 3 overlapping stents and also obtuse marginal 1 stenosis S/P drug-eluting stent placed on 06/01/2018.  He also has peripheral arterial disease with small vessel disease below the knee by Dopplers.  He was seen 2 months ago for follow-up of peripheral arterial disease, leg weakness and also hypertension and orthostatic hypotension.  Lower extremity arterial duplex in 2023 has remained stable, symptoms are more indicators of neuropathy with left leg weakness even at rest and to initiate any activity he has difficulty and is using a cane.  He was empirically started on cilostazol however he has not noticed any change in his symptoms.  Past Medical History:  Diagnosis Date   Arthritis    "a little all over my body" (06/01/2018)   BPH (benign prostatic hyperplasia)    Chronic lower back pain    Coronary artery disease    GERD (gastroesophageal reflux disease)    History of BPH    Hyperlipidemia    Hypertension    Numbness    right arm   RLS (restless legs syndrome)    Sciatica    Ventral hernia    Vertigo     Social History   Tobacco Use   Smoking status: Never   Smokeless tobacco: Never  Substance Use Topics   Alcohol use: Not Currently    Comment: 06/01/2018  "probably have 3 beers/month"    Family History  Problem Relation Age of Onset   Osteoarthritis Mother     ROS   Review of Systems  Cardiovascular:  Positive for leg swelling. Negative for chest pain and dyspnea on  exertion.  Neurological:  Positive for focal weakness (Left leg weakness and coordination difficulty).    Objective:  Blood pressure (!) 194/87, pulse 64, temperature 98 F (36.7 C), temperature source Temporal, resp. rate 16, height _0  (1.854 m), weight 245 lb 12.8 oz (111.5 kg), SpO2 94 %. Body mass index is 32.43 kg/m.      03/12/2022    8:55 AM 12/20/2021    9:30 AM 12/20/2021    9:21 AM  Vitals with BMI  Height _1   _2   Weight 245 lbs 13 oz  241 lbs  BMI 02.11  17.3  Systolic 567 014 103  Diastolic 87 69 73  Pulse 64 60 60     Orthostatic VS for the past 72 hrs (Last 3 readings):  Orthostatic BP Patient Position BP Location Cuff Size Orthostatic Pulse  03/12/22 0908 197/83 Standing Left Arm Large 57  03/12/22 0907 184/86 Sitting Left Arm Large 57  03/12/22 0905 184/82 Supine Left Arm Large 64    Physical Exam Vitals reviewed.  Constitutional:      Appearance: He is well-developed. He is obese.     Comments: Mildly obese  Neck:     Vascular: No carotid bruit or JVD.  Cardiovascular:     Rate and Rhythm: Normal rate and regular rhythm.  Pulses: Intact distal pulses.          Carotid pulses are  on the right side with bruit and  on the left side with bruit.      Femoral pulses are 2+ on the right side with bruit and 2+ on the left side with bruit.      Popliteal pulses are 2+ on the right side and 2+ on the left side.       Dorsalis pedis pulses are 0 on the right side and 0 on the left side.       Posterior tibial pulses are 0 on the right side and 0 on the left side.     Heart sounds: Murmur heard.     Midsystolic murmur is present with a grade of 2/6 at the upper right sternal border.  Pulmonary:     Effort: Pulmonary effort is normal. No accessory muscle usage or respiratory distress.     Breath sounds: Normal breath sounds.  Musculoskeletal:     Right lower leg: Edema (1+ pitting) present.     Left lower leg: Edema (Venostasis changes noted. 1-2 +  pitting) present.    Laboratory examination:   Lab Results  Component Value Date   NA 138 12/06/2021   K 4.9 12/06/2021   CO2 21 12/06/2021   GLUCOSE 112 (H) 12/06/2021   BUN 26 12/06/2021   CREATININE 1.13 12/06/2021   CALCIUM 9.7 12/06/2021   EGFR 65 12/06/2021   GFRNONAA >60 11/11/2019      Latest Ref Rng & Units 06/19/2020    4:29 PM 11/11/2019    2:14 AM 11/10/2019    6:16 AM  Hepatic Function  Total Protein 6.0 - 8.5 g/dL 6.9  5.3  5.5   Albumin 3.5 - 5.0 g/dL  3.0  3.1   AST 15 - 41 U/L  57  65   ALT 0 - 44 U/L  71  65   Alk Phosphatase 38 - 126 U/L  50  48   Total Bilirubin 0.3 - 1.2 mg/dL  1.1  1.0        Latest Ref Rng & Units 11/11/2019    2:14 AM 11/10/2019    6:16 AM 11/09/2019    2:25 AM  CBC  WBC 4.0 - 10.5 K/uL 6.6  6.4  8.1   Hemoglobin 13.0 - 17.0 g/dL 12.9  13.2  12.6   Hematocrit 39.0 - 52.0 % 39.2  38.9  37.6   Platelets 150 - 400 K/uL 113  112  122    External labs:  Labs 06/07/2021:  Total cholesterol 179, triglycerides 161, HDL 36, LDL 111.  TSH normal at 2.13.  A1c 5.4%.   Hb 14.3/HCT 42.7, platelets 156, normal indicis.  Allergies  No Known Allergies   Final Medications at End of Visit     Current Outpatient Medications:    aspirin EC 81 MG tablet, Take 81 mg by mouth daily., Disp: , Rfl:    atorvastatin (LIPITOR) 20 MG tablet, Take 1 tablet (20 mg total) by mouth daily., Disp: 100 tablet, Rfl: 3   chlorthalidone (HYGROTON) 25 MG tablet, Take 1 tablet (25 mg total) by mouth every morning., Disp: 30 tablet, Rfl: 2   dorzolamide-timolol (COSOPT) 22.3-6.8 MG/ML ophthalmic solution, Place 1 drop into both eyes in the morning and at bedtime., Disp: , Rfl:    ezetimibe (ZETIA) 10 MG tablet, Take 1 tablet (10 mg total) by mouth every evening., Disp: 90 tablet, Rfl: 3  fluticasone (FLONASE) 50 MCG/ACT nasal spray, Place 2 sprays into both nostrils daily., Disp: 9.9 mL, Rfl: 0   MAGNESIUM PO, Take 1 tablet by mouth daily., Disp: , Rfl:     meclizine (ANTIVERT) 25 MG tablet, Take 25 mg by mouth 3 (three) times daily as needed for dizziness., Disp: , Rfl:    nitroGLYCERIN (NITROSTAT) 0.4 MG SL tablet, Place 0.4 mg under the tongue every 5 (five) minutes as needed for chest pain., Disp: , Rfl:    omeprazole (PRILOSEC) 20 MG capsule, Take 20 mg by mouth daily., Disp: , Rfl:    potassium chloride SA (KLOR-CON M20) 20 MEQ tablet, Take 1 tablet (20 mEq total) by mouth daily., Disp: 30 tablet, Rfl: 3   pramipexole (MIRAPEX) 0.5 MG tablet, TAKE 1 TABLET(0.5 MG) BY MOUTH AT BEDTIME, Disp: 90 tablet, Rfl: 0   terazosin (HYTRIN) 5 MG capsule, Take 5 mg by mouth at bedtime., Disp: , Rfl:    lisinopril (ZESTRIL) 40 MG tablet, Take 1 tablet (40 mg total) by mouth daily., Disp: 30 tablet, Rfl: 2   Radiology:  No results found.  Cardiac Studies:   Coronary angiogram 06/01/2018: LM short normal. LAD prox to mid 80-99% S/P 2.5x40, 2.75x15 and 2.5x13 mm Orserio DES. OM1 80% S/P 2.5x13 mm Orserio DES. All stenosis reduced to 0%. Mid RCA 50%. Normal LV.  PCV MYOCARDIAL PERFUSION WITH LEXISCAN 08/12/2021 Nondiagnostic ECG stress consistent with Lexiscan. Myocardial perfusion is abnormal. There is a small sized partially reversible (scar with ischemia) moderate defect in the apical region. There is a moderate sized reversible mild defect in the lateral, inferior and septal regions. Overall LV systolic function is normal with regional wall motion abnormalities. Stress LV EF: 59%. Compared to the study done on 08/08/2019, no significant change in ischemia and scar. Previously noted Moderate LV dilatation in both rest and stress is now only mildly dilated and EF has improved from 49%. Intermediate risk.   PCV ECHOCARDIOGRAM COMPLETE 43/88/8757 Normal LV systolic function with EF 61%. Left ventricle cavity is normal in size. Moderate concentric remodeling of the left ventricle. Normal global wall motion. Doppler evidence of grade I (impaired)  diastolic dysfunction. Calculated EF 61%. Trileaflet aortic valve. No evidence of aortic stenosis. Mild to moderate aortic regurgitation. Mild calcification of the aortic valve annulus. Mild aortic valve leaflet calcification. Structurally normal tricuspid valve with trace regurgitation. No evidence of tricuspid valve stenosis. No evidence of pulmonary hypertension. No significant change since 08/22/2019.   Lower Extremity Arterial Duplex 12/17/2021: No hemodynamically significant stenoses are identified in the right or left lower extremity arterial system. There is suggestion of <50% stenosis right distal SFA/proximal popliteal artery.  Left AP is occluded with distal collaterals.  This exam reveals normal perfusion of the right lower extremity (ABI 1.12) and normal perfusion of the left lower extremity (ABI 1.05) with mildly abnormal biphasic waveforms at the ankles.   EKG   EKG 03/12/2022: Sinus rhythm with first-degree block at rate of 62 bpm, normal axis, right bundle branch block.  Compared to prior EKGs including 11/28/2021, left anterior fascicular block is not present. Assessment:     ICD-10-CM   1. Atherosclerosis of native coronary artery of native heart with stable angina pectoris ([place])  I25.118 EKG 12-Lead    2. Hypercholesteremia  E78.00 ezetimibe (ZETIA) 10 MG tablet    Lipid Panel With LDL/HDL Ratio    Lipid Panel With LDL/HDL Ratio    3. Primary hypertension  I10 lisinopril (ZESTRIL) 40 MG tablet  chlorthalidone (HYGROTON) 25 MG tablet    CMP14+EGFR    CMP14+EGFR    4. Orthostatic hypotension  I95.1     5. Bilateral carotid bruits  R09.89 PCV CAROTID DUPLEX (BILATERAL)     Meds ordered this encounter  Medications   ezetimibe (ZETIA) 10 MG tablet    Sig: Take 1 tablet (10 mg total) by mouth every evening.    Dispense:  90 tablet    Refill:  3   lisinopril (ZESTRIL) 40 MG tablet    Sig: Take 1 tablet (40 mg total) by mouth daily.    Dispense:  30 tablet     Refill:  2   chlorthalidone (HYGROTON) 25 MG tablet    Sig: Take 1 tablet (25 mg total) by mouth every morning.    Dispense:  30 tablet    Refill:  2    Medications Discontinued During This Encounter  Medication Reason   cilostazol (PLETAL) 100 MG tablet Ineffective   lisinopril (ZESTRIL) 10 MG tablet Reorder     Recommendations:   CABLE FEARN  is a 81 y.o. with lumbar radiculopathy with gait imbalance, he uses a cane, GERD, hypertension, orthostatic hypotension, migraines, and hyperlipidemia, CAD s/p complex intervention to the proximal LAD implantation of 3 overlapping stents and also obtuse marginal 1 stenosis S/P drug-eluting stent placed on 06/01/2018.  He also has peripheral arterial disease with small vessel disease below the knee by Dopplers.  He was seen 2 months ago for follow-up of peripheral arterial disease, leg weakness and also hypertension and orthostatic hypotension.  Lower extremity arterial duplex in 2023 has remained stable, symptoms are more indicators of neuropathy with left leg weakness even at rest and to initiate any activity he has difficulty and is using a cane.  He was empirically started on cilostazol however he has not noticed any change in his symptoms hence we will discontinue this to reduce the risk of bleeding.  No critical limb ischemia.  On exam he also has bilateral carotid artery bruit which has not been documented previously, will obtain carotid artery duplex.  His blood pressure is markedly elevated today, will increase lisinopril from 10 mg to 40 mg in the morning, add chlorthalidone 25 mg in the morning.  Lipids are not controlled, will add Zetia 10 mg daily in the evening.  Will obtain CMP as his LFTs were mildly abnormal in the past and also lipid profile testing in 1 month and I will like to see him back in 6 weeks for follow-up of hypertension and orthostatic hypotension.  Adrian Prows, PA-C 03/12/2022, 9:40 AM Office: [phone]

## 2022-03-12 NOTE — Patient Instructions (Signed)
Please do not forget to get lab work done in 1 month from now at [facility]

## 2022-03-19 ENCOUNTER — Ambulatory Visit: Payer: Medicare Other

## 2022-03-19 DIAGNOSIS — R0989 Other specified symptoms and signs involving the circulatory and respiratory systems: Secondary | ICD-10-CM

## 2022-04-14 DIAGNOSIS — I10 Essential (primary) hypertension: Secondary | ICD-10-CM | POA: Diagnosis not present

## 2022-04-14 DIAGNOSIS — E7800 Pure hypercholesterolemia, unspecified: Secondary | ICD-10-CM | POA: Diagnosis not present

## 2022-04-15 LAB — CMP14+EGFR
ALT: 29 IU/L (ref 0–44)
AST: 30 IU/L (ref 0–40)
Albumin/Globulin Ratio: 2.5 — ABNORMAL HIGH (ref 1.2–2.2)
Albumin: 4.9 g/dL — ABNORMAL HIGH (ref 3.7–4.7)
Alkaline Phosphatase: 71 IU/L (ref 44–121)
BUN/Creatinine Ratio: 16 (ref 10–24)
BUN: 21 mg/dL (ref 8–27)
Bilirubin Total: 0.6 mg/dL (ref 0.0–1.2)
CO2: 20 mmol/L (ref 20–29)
Calcium: 9.7 mg/dL (ref 8.6–10.2)
Chloride: 100 mmol/L (ref 96–106)
Creatinine, Ser: 1.34 mg/dL — ABNORMAL HIGH (ref 0.76–1.27)
Globulin, Total: 2 g/dL (ref 1.5–4.5)
Glucose: 118 mg/dL — ABNORMAL HIGH (ref 70–99)
Potassium: 4.5 mmol/L (ref 3.5–5.2)
Sodium: 137 mmol/L (ref 134–144)
Total Protein: 6.9 g/dL (ref 6.0–8.5)
eGFR: 53 mL/min/{1.73_m2} — ABNORMAL LOW (ref >59)

## 2022-04-15 LAB — LIPID PANEL WITH LDL/HDL RATIO
Cholesterol, Total: 116 mg/dL (ref 100–199)
HDL: 37 mg/dL — ABNORMAL LOW (ref >39)
LDL Chol Calc (NIH): 60 mg/dL (ref 0–99)
LDL/HDL Ratio: 1.6 ratio (ref 0.0–3.6)
Triglycerides: 104 mg/dL (ref 0–149)
VLDL Cholesterol Cal: 19 mg/dL (ref 5–40)

## 2022-04-15 NOTE — Progress Notes (Signed)
Mild renal insufficiency with increasing Lisinopril from 10 to 40 and addition of chorthalidone. Will discuss on OV soon

## 2022-04-24 ENCOUNTER — Ambulatory Visit: Payer: Medicare Other | Admitting: Cardiology

## 2022-04-24 ENCOUNTER — Encounter: Payer: Self-pay | Admitting: Cardiology

## 2022-04-24 VITALS — BP 188/71 | HR 69 | Temp 98.3°F | Resp 16 | Ht 73.0 in | Wt 240.0 lb

## 2022-04-24 DIAGNOSIS — I6522 Occlusion and stenosis of left carotid artery: Secondary | ICD-10-CM

## 2022-04-24 DIAGNOSIS — I10 Essential (primary) hypertension: Secondary | ICD-10-CM

## 2022-04-24 DIAGNOSIS — I25118 Atherosclerotic heart disease of native coronary artery with other forms of angina pectoris: Secondary | ICD-10-CM

## 2022-04-24 DIAGNOSIS — E7800 Pure hypercholesterolemia, unspecified: Secondary | ICD-10-CM

## 2022-04-24 DIAGNOSIS — R0989 Other specified symptoms and signs involving the circulatory and respiratory systems: Secondary | ICD-10-CM | POA: Diagnosis not present

## 2022-04-24 MED ORDER — CHLORTHALIDONE 25 MG PO TABS: 12.5000 mg | ORAL_TABLET | ORAL | 3 refills | Status: DC

## 2022-04-24 MED ORDER — LISINOPRIL 20 MG PO TABS: 20.0000 mg | ORAL_TABLET | Freq: Every day | ORAL | 3 refills | Status: DC

## 2022-04-24 NOTE — Progress Notes (Signed)
Primary Physician:  Deon Pilling, NP   Patient ID: Cody Floyd, male    DOB: 1941/07/31, 81 y.o.   MRN: [id number]  Subjective:    Chief Complaint  Patient presents with   Hypertension   Coronary Artery Disease    Cody Floyd  is a 81 y.o. male with lumbar radiculopathy with gait imbalance, he uses a cane, GERD, hypertension, orthostatic hypotension, migraines, and hyperlipidemia, CAD s/p complex intervention to the proximal LAD implantation of 3 overlapping stents and also obtuse marginal 1 stenosis S/P drug-eluting stent placed on 06/01/2018.  He also has peripheral arterial disease with small vessel disease below the knee by Dopplers.  On his last office visit due to uncontrolled lipids, I had added Zetia, repeat lipids revealed excellent control of his LDL to <70.  His blood pressure was elevated, I had increased the dose of lisinopril from 10 mg to 40 mg and added chlorthalidone 25 mg daily.  States that he is doing well and essentially except for chronic back pain and leg pain, no other complaints today.  Accompanied by his daughter.  Past Medical History:  Diagnosis Date   Arthritis    "a little all over my body" (06/01/2018)   BPH (benign prostatic hyperplasia)    Chronic lower back pain    Coronary artery disease    GERD (gastroesophageal reflux disease)    History of BPH    Hyperlipidemia    Hypertension    Numbness    right arm   RLS (restless legs syndrome)    Sciatica    Ventral hernia    Vertigo     Social History   Tobacco Use   Smoking status: Never   Smokeless tobacco: Never  Substance Use Topics   Alcohol use: Not Currently    Comment: 06/01/2018  "probably have 3 beers/month"    Family History  Problem Relation Age of Onset   Osteoarthritis Mother     ROS   Review of Systems  Cardiovascular:  Negative for chest pain, dyspnea on exertion and leg swelling.  Neurological:  Positive for focal weakness (Left leg weakness and coordination  difficulty).   Objective:  Blood pressure (!) 188/71, pulse 69, temperature 98.3 F (36.8 C), temperature source Temporal, resp. rate 16, height 6' 1"  (1.854 m), weight 240 lb (108.9 kg), SpO2 97 %. Body mass index is 31.66 kg/m.      04/24/2022    9:38 AM 03/12/2022    8:55 AM 12/20/2021    9:30 AM  Vitals with BMI  Height 6' 1"  6' 1"    Weight 240 lbs 245 lbs 13 oz   BMI 30.94 07.68   Systolic 088 110 315  Diastolic 71 87 69  Pulse 69 64 60     Orthostatic VS for the past 72 hrs (Last 3 readings):  Orthostatic BP Patient Position BP Location Cuff Size Orthostatic Pulse  04/24/22 1009 111/67 Sitting Left Arm Large 82  04/24/22 1008 134/76 Sitting Left Arm Large 76  04/24/22 0947 111/67 Standing Left Arm Large 80   Physical Exam Vitals reviewed.  Constitutional:      Appearance: He is well-developed. He is obese.     Comments: Mildly obese  Neck:     Vascular: No carotid bruit or JVD.  Cardiovascular:     Rate and Rhythm: Normal rate and regular rhythm.     Pulses: Intact distal pulses.          Carotid pulses are  on  the right side with bruit and  on the left side with bruit.      Femoral pulses are 2+ on the right side with bruit and 2+ on the left side with bruit.      Popliteal pulses are 2+ on the right side and 2+ on the left side.       Dorsalis pedis pulses are 0 on the right side and 0 on the left side.       Posterior tibial pulses are 0 on the right side and 0 on the left side.     Heart sounds: Murmur heard.     Midsystolic murmur is present with a grade of 2/6 at the upper right sternal border.  Pulmonary:     Effort: Pulmonary effort is normal. No accessory muscle usage or respiratory distress.     Breath sounds: Normal breath sounds.  Musculoskeletal:     Right lower leg: Edema (1+ pitting) present.     Left lower leg: Edema (Venostasis changes noted. 1-2 + pitting) present.    Laboratory examination:   Lab Results  Component Value Date   NA 137  04/14/2022   K 4.5 04/14/2022   CO2 20 04/14/2022   GLUCOSE 118 (H) 04/14/2022   BUN 21 04/14/2022   CREATININE 1.34 (H) 04/14/2022   CALCIUM 9.7 04/14/2022   EGFR 53 (L) 04/14/2022   GFRNONAA >60 11/11/2019   Lab Results  Component Value Date   CREATININE 1.34 (H) 04/14/2022   CREATININE 1.13 12/06/2021   CREATININE 0.96 11/11/2019       Latest Ref Rng & Units 04/14/2022    8:50 AM 06/19/2020    4:29 PM 11/11/2019    2:14 AM  Hepatic Function  Total Protein 6.0 - 8.5 g/dL 6.9  6.9  5.3   Albumin 3.7 - 4.7 g/dL 4.9   3.0   AST 0 - 40 IU/L 30   57   ALT 0 - 44 IU/L 29   71   Alk Phosphatase 44 - 121 IU/L 71   50   Total Bilirubin 0.0 - 1.2 mg/dL 0.6   1.1        Latest Ref Rng & Units 11/11/2019    2:14 AM 11/10/2019    6:16 AM 11/09/2019    2:25 AM  CBC  WBC 4.0 - 10.5 K/uL 6.6  6.4  8.1   Hemoglobin 13.0 - 17.0 g/dL 12.9  13.2  12.6   Hematocrit 39.0 - 52.0 % 39.2  38.9  37.6   Platelets 150 - 400 K/uL 113  112  122    Lab Results  Component Value Date   CHOL 116 04/14/2022   HDL 37 (L) 04/14/2022   LDLCALC 60 04/14/2022   TRIG 104 04/14/2022    External labs:  Labs 06/07/2021:  Total cholesterol 179, triglycerides 161, HDL 36, LDL 111.  TSH normal at 2.13.  A1c 5.4%.   Hb 14.3/HCT 42.7, platelets 156, normal indicis.  Allergies  No Known Allergies   Final Medications at End of Visit     Current Outpatient Medications:    aspirin EC 81 MG tablet, Take 81 mg by mouth daily., Disp: , Rfl:    atorvastatin (LIPITOR) 20 MG tablet, Take 1 tablet (20 mg total) by mouth daily., Disp: 100 tablet, Rfl: 3   dorzolamide-timolol (COSOPT) 22.3-6.8 MG/ML ophthalmic solution, Place 1 drop into both eyes in the morning and at bedtime., Disp: , Rfl:    ezetimibe (ZETIA) 10 MG  tablet, Take 1 tablet (10 mg total) by mouth every evening., Disp: 90 tablet, Rfl: 3   fluticasone (FLONASE) 50 MCG/ACT nasal spray, Place 2 sprays into both nostrils daily., Disp: 9.9 mL, Rfl:  0   meclizine (ANTIVERT) 25 MG tablet, Take 25 mg by mouth 3 (three) times daily as needed for dizziness., Disp: , Rfl:    nitroGLYCERIN (NITROSTAT) 0.4 MG SL tablet, Place 0.4 mg under the tongue every 5 (five) minutes as needed for chest pain., Disp: , Rfl:    omeprazole (PRILOSEC) 20 MG capsule, Take 20 mg by mouth daily., Disp: , Rfl:    pramipexole (MIRAPEX) 0.5 MG tablet, TAKE 1 TABLET(0.5 MG) BY MOUTH AT BEDTIME, Disp: 90 tablet, Rfl: 0   terazosin (HYTRIN) 5 MG capsule, Take 5 mg by mouth at bedtime., Disp: , Rfl:    chlorthalidone (HYGROTON) 25 MG tablet, Take 0.5 tablets (12.5 mg total) by mouth every morning., Disp: 45 tablet, Rfl: 3   lisinopril (ZESTRIL) 20 MG tablet, Take 1 tablet (20 mg total) by mouth daily., Disp: 90 tablet, Rfl: 3   potassium chloride SA (KLOR-CON M20) 20 MEQ tablet, Take 1 tablet (20 mEq total) by mouth daily., Disp: 30 tablet, Rfl: 3   Radiology:  No results found.  Cardiac Studies:   Coronary angiogram 06/01/2018: LM short normal. LAD prox to mid 80-99% S/P 2.5x40, 2.75x15 and 2.5x13 mm Orserio DES. OM1 80% S/P 2.5x13 mm Orserio DES. All stenosis reduced to 0%. Mid RCA 50%. Normal LV.  PCV MYOCARDIAL PERFUSION WITH LEXISCAN 08/12/2021 Nondiagnostic ECG stress consistent with Lexiscan. Myocardial perfusion is abnormal. There is a small sized partially reversible (scar with ischemia) moderate defect in the apical region. There is a moderate sized reversible mild defect in the lateral, inferior and septal regions. Overall LV systolic function is normal with regional wall motion abnormalities. Stress LV EF: 59%. Compared to the study done on 08/08/2019, no significant change in ischemia and scar. Previously noted Moderate LV dilatation in both rest and stress is now only mildly dilated and EF has improved from 49%. Intermediate risk.   PCV ECHOCARDIOGRAM COMPLETE 04/88/8916 Normal LV systolic function with EF 61%. Left ventricle cavity is normal in size.  Moderate concentric remodeling of the left ventricle. Normal global wall motion. Doppler evidence of grade I (impaired) diastolic dysfunction. Calculated EF 61%. Trileaflet aortic valve. No evidence of aortic stenosis. Mild to moderate aortic regurgitation. Mild calcification of the aortic valve annulus. Mild aortic valve leaflet calcification. Structurally normal tricuspid valve with trace regurgitation. No evidence of tricuspid valve stenosis. No evidence of pulmonary hypertension. No significant change since 08/22/2019.   Lower Extremity Arterial Duplex 12/17/2021: No hemodynamically significant stenoses are identified in the right or left lower extremity arterial system. There is suggestion of <50% stenosis right distal SFA/proximal popliteal artery.  Left AP is occluded with distal collaterals.  This exam reveals normal perfusion of the right lower extremity (ABI 1.12) and normal perfusion of the left lower extremity (ABI 1.05) with mildly abnormal biphasic waveforms at the ankles.   Carotid artery duplex 03/19/2022: Duplex suggests stenosis in the right internal carotid artery (1-15%). There is moderate amount of heterogeneous plaque noted in the carotid bulb and ICA. Duplex suggests stenosis in the left internal carotid artery (50-69%) with heterogeneous plaque. Antegrade right vertebral artery flow. Antegrade left vertebral artery flow. Follow up in six months is appropriate if clinically indicated.  EKG   EKG 03/12/2022: Sinus rhythm with first-degree block at rate of 62 bpm,  normal axis, right bundle branch block.  Compared to prior EKGs including 11/28/2021, left anterior fascicular block is not present. Assessment:     ICD-10-CM   1. Atherosclerosis of native coronary artery of native heart with stable angina pectoris ([place])  I25.118     2. Primary hypertension  I10 lisinopril (ZESTRIL) 20 MG tablet    chlorthalidone (HYGROTON) 25 MG tablet    Basic metabolic panel    3.  Bilateral carotid bruits  R09.89 PCV CAROTID DUPLEX (BILATERAL)    4. Asymptomatic stenosis of left carotid artery  I65.22 PCV CAROTID DUPLEX (BILATERAL)    5. Hypercholesteremia  E78.00      Meds ordered this encounter  Medications   lisinopril (ZESTRIL) 20 MG tablet    Sig: Take 1 tablet (20 mg total) by mouth daily.    Dispense:  90 tablet    Refill:  3   chlorthalidone (HYGROTON) 25 MG tablet    Sig: Take 0.5 tablets (12.5 mg total) by mouth every morning.    Dispense:  45 tablet    Refill:  3    Medications Discontinued During This Encounter  Medication Reason   MAGNESIUM PO    lisinopril (ZESTRIL) 40 MG tablet Reorder   chlorthalidone (HYGROTON) 25 MG tablet Reorder    Recommendations:   JADARIAN MCKAY  is a 81 y.o. with lumbar radiculopathy with gait imbalance, he uses a cane, GERD, hypertension, orthostatic hypotension, migraines, and hyperlipidemia, CAD s/p complex intervention to the proximal LAD implantation of 3 overlapping stents and also obtuse marginal 1 stenosis S/P drug-eluting stent placed on 06/01/2018.  He also has peripheral arterial disease with small vessel disease below the knee by Dopplers.  On his last office visit due to uncontrolled lipids, I had added Zetia, repeat lipids revealed excellent control of his LDL to <70.  His blood pressure was elevated, I had increased the dose of lisinopril from 10 mg to 40 mg and added chlorthalidone 25 mg daily, renal function has worsened.  He had normal renal function previously.  We will reduce the dose from 40 mg of lisinopril to 20 mg and also reduce chlorthalidone to 12.5 mg daily, patient does have orthostatic hypotension, so we will treat his standing blood pressure only.  Carotid artery duplex reveals asymptomatic left carotid moderate stenosis, he will need continued surveillance with 77-monthbasis.  I will perform this and see him back after the carotid artery duplex.  In view of medication changes and to follow-up  on renal dysfunction, will repeat BMP in 2 to 3 weeks.  His daughter is present and all questions answered.    JAdrian Prows PA-C 04/24/2022, 12:01 PM Office: [phone]

## 2022-05-24 DIAGNOSIS — K219 Gastro-esophageal reflux disease without esophagitis: Secondary | ICD-10-CM | POA: Diagnosis not present

## 2022-05-24 DIAGNOSIS — I10 Essential (primary) hypertension: Secondary | ICD-10-CM | POA: Diagnosis not present

## 2022-05-29 ENCOUNTER — Telehealth: Payer: Self-pay

## 2022-05-29 NOTE — Patient Outreach (Signed)
  Care Coordination   05/29/2022 Name: Cody Floyd MRN: [id number] DOB: 28-Aug-1940   Care Coordination Outreach Attempts:  An unsuccessful telephone outreach was attempted today to offer the patient information about available care coordination services as a benefit of their health plan.   Follow Up Plan:  Additional outreach attempts will be made to offer the patient care coordination information and services.   Encounter Outcome:  No Answer  Care Coordination Interventions Activated:  No   Care Coordination Interventions:  No, not indicated     Jone Baseman, RN, MSN [facility] Care Management Care Management Coordinator Direct Line [phone]

## 2022-06-13 ENCOUNTER — Telehealth: Payer: Self-pay

## 2022-06-13 NOTE — Patient Outreach (Signed)
  Care Coordination   06/13/2022 Name: ARAM DOMZALSKI MRN: [id number] DOB: 1941-05-16   Care Coordination Outreach Attempts:  A second unsuccessful outreach was attempted today to offer the patient with information about available care coordination services as a benefit of their health plan.     Follow Up Plan:  Additional outreach attempts will be made to offer the patient care coordination information and services.   Encounter Outcome:  No Answer  Care Coordination Interventions Activated:  No   Care Coordination Interventions:  No, not indicated    Jone Baseman, RN, MSN [facility] Care Management Care Management Coordinator Direct Line [phone]

## 2022-06-30 ENCOUNTER — Telehealth: Payer: Self-pay

## 2022-06-30 NOTE — Patient Outreach (Signed)
  Care Coordination   06/30/2022 Name: Cody Floyd MRN: [id number] DOB: 12-24-1940   Care Coordination Outreach Attempts:  A third unsuccessful outreach was attempted today to offer the patient with information about available care coordination services as a benefit of their health plan.   Follow Up Plan:  No further outreach attempts will be made at this time. We have been unable to contact the patient to offer or enroll patient in care coordination services  Encounter Outcome:  No Answer  Care Coordination Interventions Activated:  No   Care Coordination Interventions:  No, not indicated    Jone Baseman, RN, MSN [place] [facility] Care Management Coordinator Direct Line [phone]

## 2022-07-29 DIAGNOSIS — Z23 Encounter for immunization: Secondary | ICD-10-CM | POA: Diagnosis not present

## 2022-09-11 DIAGNOSIS — I10 Essential (primary) hypertension: Secondary | ICD-10-CM | POA: Diagnosis not present

## 2022-09-11 DIAGNOSIS — R5383 Other fatigue: Secondary | ICD-10-CM | POA: Diagnosis not present

## 2022-09-11 DIAGNOSIS — Z0000 Encounter for general adult medical examination without abnormal findings: Secondary | ICD-10-CM | POA: Diagnosis not present

## 2022-09-11 DIAGNOSIS — R739 Hyperglycemia, unspecified: Secondary | ICD-10-CM | POA: Diagnosis not present

## 2022-09-18 DIAGNOSIS — Z23 Encounter for immunization: Secondary | ICD-10-CM | POA: Diagnosis not present

## 2022-09-18 DIAGNOSIS — G629 Polyneuropathy, unspecified: Secondary | ICD-10-CM | POA: Diagnosis not present

## 2022-09-18 DIAGNOSIS — K219 Gastro-esophageal reflux disease without esophagitis: Secondary | ICD-10-CM | POA: Diagnosis not present

## 2022-09-18 DIAGNOSIS — G2581 Restless legs syndrome: Secondary | ICD-10-CM | POA: Diagnosis not present

## 2022-09-18 DIAGNOSIS — I25118 Atherosclerotic heart disease of native coronary artery with other forms of angina pectoris: Secondary | ICD-10-CM | POA: Diagnosis not present

## 2022-09-18 DIAGNOSIS — I10 Essential (primary) hypertension: Secondary | ICD-10-CM | POA: Diagnosis not present

## 2022-09-18 DIAGNOSIS — I739 Peripheral vascular disease, unspecified: Secondary | ICD-10-CM | POA: Diagnosis not present

## 2022-09-18 DIAGNOSIS — Z0000 Encounter for general adult medical examination without abnormal findings: Secondary | ICD-10-CM | POA: Diagnosis not present

## 2022-09-18 DIAGNOSIS — K439 Ventral hernia without obstruction or gangrene: Secondary | ICD-10-CM | POA: Diagnosis not present

## 2022-09-18 DIAGNOSIS — R600 Localized edema: Secondary | ICD-10-CM | POA: Diagnosis not present

## 2022-10-20 ENCOUNTER — Other Ambulatory Visit: Payer: Medicare Other

## 2022-10-27 ENCOUNTER — Ambulatory Visit: Payer: Medicare Other | Admitting: Cardiology

## 2022-11-03 ENCOUNTER — Ambulatory Visit: Payer: Medicare Other | Admitting: Cardiology

## 2022-11-03 ENCOUNTER — Ambulatory Visit: Payer: Medicare Other

## 2022-11-03 ENCOUNTER — Encounter: Payer: Self-pay | Admitting: Cardiology

## 2022-11-03 VITALS — BP 130/70 | HR 74 | Ht 73.0 in | Wt 247.0 lb

## 2022-11-03 DIAGNOSIS — I25118 Atherosclerotic heart disease of native coronary artery with other forms of angina pectoris: Secondary | ICD-10-CM

## 2022-11-03 DIAGNOSIS — I10 Essential (primary) hypertension: Secondary | ICD-10-CM | POA: Diagnosis not present

## 2022-11-03 DIAGNOSIS — E7800 Pure hypercholesterolemia, unspecified: Secondary | ICD-10-CM

## 2022-11-03 DIAGNOSIS — I6522 Occlusion and stenosis of left carotid artery: Secondary | ICD-10-CM | POA: Diagnosis not present

## 2022-11-03 DIAGNOSIS — I6523 Occlusion and stenosis of bilateral carotid arteries: Secondary | ICD-10-CM | POA: Diagnosis not present

## 2022-11-03 DIAGNOSIS — R0989 Other specified symptoms and signs involving the circulatory and respiratory systems: Secondary | ICD-10-CM

## 2022-11-03 MED ORDER — LOSARTAN POTASSIUM 25 MG PO TABS: 25.0000 mg | ORAL_TABLET | Freq: Every evening | ORAL | 3 refills | Status: AC

## 2022-11-03 NOTE — Progress Notes (Signed)
Primary Physician:  Deon Pilling, NP   Patient ID: Cody Floyd, male    DOB: 09-17-1940, 82 y.o.   MRN: [id number]  Subjective:    Chief Complaint  Patient presents with   Coronary Artery Disease   Follow-up   Results    Cody Floyd  is a 82 y.o. male with lumbar radiculopathy with gait imbalance, he uses a cane, GERD, hypertension, orthostatic hypotension, migraines, and hyperlipidemia, CAD s/p complex intervention to the proximal LAD implantation of 3 overlapping stents and also obtuse marginal 1 stenosis S/P drug-eluting stent placed on 06/01/2018.  He also has peripheral arterial disease with small vessel disease below the knee by Dopplers.  The 45-monthoffice visit, he has not had any recurrence of angina pectoris, he has not had any recent hospitalizations and he is tolerating all his medications well.  States that he is doing well and essentially except for chronic back pain and leg pain, no other complaints today.  Accompanied by his daughter.  Past Medical History:  Diagnosis Date   Arthritis    "a little all over my body" (06/01/2018)   BPH (benign prostatic hyperplasia)    Chronic lower back pain    Coronary artery disease    GERD (gastroesophageal reflux disease)    History of BPH    Hyperlipidemia    Hypertension    Numbness    right arm   RLS (restless legs syndrome)    Sciatica    Ventral hernia    Vertigo     Social History   Tobacco Use   Smoking status: Never   Smokeless tobacco: Never  Substance Use Topics   Alcohol use: Not Currently    Comment: 06/01/2018  "probably have 3 beers/month"    Family History  Problem Relation Age of Onset   Osteoarthritis Mother     ROS   Review of Systems  Cardiovascular:  Negative for chest pain, dyspnea on exertion and leg swelling.  Neurological:  Positive for focal weakness (Left leg weakness and coordination difficulty).   Objective:  Blood pressure 130/70, pulse 74, height '6\' 1"'$  (1.854 m), weight 247 lb  (112 kg), SpO2 95 %. Body mass index is 32.59 kg/m.      11/03/2022   10:21 AM 04/24/2022    9:38 AM 03/12/2022    8:55 AM  Vitals with BMI  Height '6\' 1"'$  '6\' 1"'$  '6\' 1"'$   Weight 247 lbs 240 lbs 245 lbs 13 oz  BMI 32.59 [id number] Systolic [id number] Diastolic 70 71 87  Pulse 74 69 64     Orthostatic VS for the past 72 hrs (Last 3 readings):  Orthostatic BP Patient Position BP Location Cuff Size Orthostatic Pulse  11/03/22 1032 130/60 Standing Left Arm Large 78  11/03/22 1030 150/83 Sitting Left Arm Large 76  11/03/22 1029 185/81 Supine Left Arm Large 70   Physical Exam Vitals reviewed.  Constitutional:      Appearance: He is well-developed. He is obese.     Comments: Mildly obese  Neck:     Vascular: Carotid bruit (bilateral) present. No JVD.  Cardiovascular:     Rate and Rhythm: Normal rate and regular rhythm.     Pulses: Intact distal pulses.          Popliteal pulses are 2+ on the right side and 2+ on the left side.       Dorsalis pedis pulses are 0 on the right side and 0 on  the left side.       Posterior tibial pulses are 0 on the right side and 0 on the left side.     Heart sounds: Murmur heard.     Midsystolic murmur is present with a grade of 2/6 at the upper right sternal border.  Pulmonary:     Effort: Pulmonary effort is normal. No accessory muscle usage.     Breath sounds: Examination of the right-lower field reveals rales. Examination of the left-lower field reveals rales. Rales present.  Abdominal:     General: Bowel sounds are normal.     Palpations: Abdomen is soft.  Musculoskeletal:     Right lower leg: Edema (2+ pitting) present.     Left lower leg: Edema (Venostasis changes noted. 2 + pitting) present.    Laboratory examination:   Lab Results  Component Value Date   NA 137 04/14/2022   K 4.5 04/14/2022   CO2 20 04/14/2022   GLUCOSE 118 (H) 04/14/2022   BUN 21 04/14/2022   CREATININE 1.34 (H) 04/14/2022   CALCIUM 9.7 04/14/2022   EGFR 53 (L)  04/14/2022   GFRNONAA >60 11/11/2019    Lab Results  Component Value Date   CHOL 116 04/14/2022   HDL 37 (L) 04/14/2022   LDLCALC 60 04/14/2022   TRIG 104 04/14/2022    External labs:  Labs 09/17/2022:  A1c 5.6%.  TSH normal at 2.76.  Hb 14.1/HCT 42.3, platelets 169, normal indicis.  Sodium 141, potassium 4.2, BUN 19, creatinine 1.11, EGFR 60 to mL, LFTs normal.  Total cholesterol 120, triglycerides 69, HDL 43, LDL 63.  Allergies   Allergies  Allergen Reactions   Lisinopril Cough     Final Medications at End of Visit     Current Outpatient Medications:    aspirin EC 81 MG tablet, Take 81 mg by mouth daily., Disp: , Rfl:    atorvastatin (LIPITOR) 20 MG tablet, Take 1 tablet (20 mg total) by mouth daily., Disp: 100 tablet, Rfl: 3   dorzolamide-timolol (COSOPT) 22.3-6.8 MG/ML ophthalmic solution, Place 1 drop into both eyes in the morning and at bedtime., Disp: , Rfl:    ezetimibe (ZETIA) 10 MG tablet, Take 1 tablet (10 mg total) by mouth every evening., Disp: 90 tablet, Rfl: 3   fluticasone (FLONASE) 50 MCG/ACT nasal spray, Place 2 sprays into both nostrils daily., Disp: 9.9 mL, Rfl: 0   meclizine (ANTIVERT) 25 MG tablet, Take 25 mg by mouth 3 (three) times daily as needed for dizziness., Disp: , Rfl:    nitroGLYCERIN (NITROSTAT) 0.4 MG SL tablet, Place 0.4 mg under the tongue every 5 (five) minutes as needed for chest pain., Disp: , Rfl:    omeprazole (PRILOSEC) 20 MG capsule, Take 20 mg by mouth daily., Disp: , Rfl:    pramipexole (MIRAPEX) 0.5 MG tablet, TAKE 1 TABLET(0.5 MG) BY MOUTH AT BEDTIME, Disp: 90 tablet, Rfl: 0   terazosin (HYTRIN) 5 MG capsule, Take 5 mg by mouth at bedtime., Disp: , Rfl:    chlorthalidone (HYGROTON) 25 MG tablet, Take 0.5 tablets (12.5 mg total) by mouth every morning., Disp: 45 tablet, Rfl: 3   losartan (COZAAR) 25 MG tablet, Take 1 tablet (25 mg total) by mouth every evening., Disp: 90 tablet, Rfl: 3   potassium chloride SA (KLOR-CON M20) 20  MEQ tablet, Take 1 tablet (20 mEq total) by mouth daily. (Patient not taking: Reported on 11/03/2022), Disp: 30 tablet, Rfl: 3   Radiology:  No results found.  Cardiac  Studies:   Coronary angiogram 06/01/2018: LM short normal. LAD prox to mid 80-99% S/P 2.5x40, 2.75x15 and 2.5x13 mm Orserio DES. OM1 80% S/P 2.5x13 mm Orserio DES. All stenosis reduced to 0%. Mid RCA 50%. Normal LV.  PCV MYOCARDIAL PERFUSION WITH LEXISCAN 08/12/2021 Nondiagnostic ECG stress consistent with Lexiscan. Myocardial perfusion is abnormal. There is a small sized partially reversible (scar with ischemia) moderate defect in the apical region. There is a moderate sized reversible mild defect in the lateral, inferior and septal regions. Overall LV systolic function is normal with regional wall motion abnormalities. Stress LV EF: 59%. Compared to the study done on 08/08/2019, no significant change in ischemia and scar. Previously noted Moderate LV dilatation in both rest and stress is now only mildly dilated and EF has improved from 49%. Intermediate risk.   PCV ECHOCARDIOGRAM COMPLETE [id number] Normal LV systolic function with EF 61%. Left ventricle cavity is normal in size. Moderate concentric remodeling of the left ventricle. Normal global wall motion. Doppler evidence of grade I (impaired) diastolic dysfunction. Calculated EF 61%. Trileaflet aortic valve. No evidence of aortic stenosis. Mild to moderate aortic regurgitation. Mild calcification of the aortic valve annulus. Mild aortic valve leaflet calcification. Structurally normal tricuspid valve with trace regurgitation. No evidence of tricuspid valve stenosis. No evidence of pulmonary hypertension. No significant change since 08/22/2019.   Lower Extremity Arterial Duplex 12/17/2021: No hemodynamically significant stenoses are identified in the right or left lower extremity arterial system. There is suggestion of <50% stenosis right distal SFA/proximal popliteal  artery.  Left AP is occluded with distal collaterals.  This exam reveals normal perfusion of the right lower extremity (ABI 1.12) and normal perfusion of the left lower extremity (ABI 1.05) with mildly abnormal biphasic waveforms at the ankles.   Carotid artery duplex 11/03/2022: Duplex suggests stenosis in the right internal carotid artery (16-49%). < 50% stenosis in the right external carotid artery. Duplex suggests stenosis in the left internal carotid artery (50-69%). < 50%  stenosis in the left external carotid artery. Antegrade right vertebral artery flow. Antegrade left vertebral artery flow. Compared to the study done on 03/19/2022, no significant change.  Minimal progression of right ICA from 1-15% stenosis to 15 to 49% stenosis. Follow up in six months is appropriate if clinically indicated.  EKG   EKG 11/03/2022: Sinus rhythm with first-degree AV block at rate of 68 bpm, left atrial enlargement, normal axis.  Right bundle branch block.  Bifascicular block.  Compared to 03/12/2022, no significant change.  Assessment:     ICD-10-CM   1. Atherosclerosis of native coronary artery of native heart with stable angina pectoris ([place])  I25.118 EKG 12-Lead    2. Primary hypertension  I10 chlorthalidone (HYGROTON) 25 MG tablet    losartan (COZAAR) 25 MG tablet    3. Asymptomatic bilateral carotid artery stenosis  I65.23 PCV CAROTID DUPLEX (BILATERAL)    4. Hypercholesteremia  E78.00      Meds ordered this encounter  Medications   losartan (COZAAR) 25 MG tablet    Sig: Take 1 tablet (25 mg total) by mouth every evening.    Dispense:  90 tablet    Refill:  3    Medications Discontinued During This Encounter  Medication Reason   lisinopril (ZESTRIL) 20 MG tablet Side effect (s)   chlorthalidone (HYGROTON) 25 MG tablet Reorder     Recommendations:   KAYN GMEREK  is a 82 y.o. with lumbar radiculopathy with gait imbalance, he uses a cane, GERD, hypertension, orthostatic  hypotension,  migraines, and hyperlipidemia, CAD s/p complex intervention to the proximal LAD implantation of 3 overlapping stents and also obtuse marginal 1 stenosis S/P drug-eluting stent placed on 06/01/2018.  He also has peripheral arterial disease with small vessel disease below the knee by Dopplers.  1. Atherosclerosis of native coronary artery of native heart with stable angina pectoris ([place]) Patient remains asymptomatic without recurrence of angina, he has very rare episodes of angina and fortunately has not had to use any sublingual nitroglycerin in the past 3 to 4 months.  EKG reveals bifascicular block with first-degree AV block and right bundle branch block that is unchanged.  2. Primary hypertension Blood pressure under excellent control, he has supine hypertension and orthostatic hypotension.  Hence I have advised the patient that he should always be treated for blood pressure while measured standing up.  He has developed dry cough and has discontinued lisinopril 2 weeks ago with complete resolution of cough.  I have started him on losartan 25 mg in the evening in place.  Unless he has significant dizziness he will contact me.  3. Asymptomatic bilateral carotid artery stenosis I reviewed his carotid artery duplex, mild disease of the rate on moderate disease on the left overall essentially stable carotid duplex.  Lipids under excellent control, he is on aspirin, continue the same for now.  4. Hypercholesteremia Excellent control of lipids after the addition of ezetimibe.  Reviewed external labs.  I would like to see him back in 6 months for follow-up.    Adrian Prows, MD, [facility] 11/03/2022, 12:26 PM Office: [phone] Fax: [phone] Pager: [phone]

## 2022-11-23 DIAGNOSIS — K219 Gastro-esophageal reflux disease without esophagitis: Secondary | ICD-10-CM | POA: Diagnosis not present

## 2022-11-23 DIAGNOSIS — I10 Essential (primary) hypertension: Secondary | ICD-10-CM | POA: Diagnosis not present

## 2023-03-06 ENCOUNTER — Other Ambulatory Visit: Payer: Self-pay | Admitting: Cardiology

## 2023-03-06 DIAGNOSIS — E7800 Pure hypercholesterolemia, unspecified: Secondary | ICD-10-CM

## 2023-03-30 DIAGNOSIS — C44329 Squamous cell carcinoma of skin of other parts of face: Secondary | ICD-10-CM | POA: Diagnosis not present

## 2023-03-30 DIAGNOSIS — D492 Neoplasm of unspecified behavior of bone, soft tissue, and skin: Secondary | ICD-10-CM | POA: Diagnosis not present

## 2023-04-30 DIAGNOSIS — L570 Actinic keratosis: Secondary | ICD-10-CM | POA: Diagnosis not present

## 2023-04-30 DIAGNOSIS — L821 Other seborrheic keratosis: Secondary | ICD-10-CM | POA: Diagnosis not present

## 2023-04-30 DIAGNOSIS — Z85828 Personal history of other malignant neoplasm of skin: Secondary | ICD-10-CM | POA: Diagnosis not present

## 2023-04-30 DIAGNOSIS — L814 Other melanin hyperpigmentation: Secondary | ICD-10-CM | POA: Diagnosis not present

## 2023-04-30 DIAGNOSIS — D225 Melanocytic nevi of trunk: Secondary | ICD-10-CM | POA: Diagnosis not present

## 2023-04-30 DIAGNOSIS — D492 Neoplasm of unspecified behavior of bone, soft tissue, and skin: Secondary | ICD-10-CM | POA: Diagnosis not present

## 2023-04-30 DIAGNOSIS — Z08 Encounter for follow-up examination after completed treatment for malignant neoplasm: Secondary | ICD-10-CM | POA: Diagnosis not present

## 2023-05-06 ENCOUNTER — Ambulatory Visit: Payer: Medicare Other

## 2023-05-06 DIAGNOSIS — I6523 Occlusion and stenosis of bilateral carotid arteries: Secondary | ICD-10-CM | POA: Diagnosis not present

## 2023-05-19 ENCOUNTER — Ambulatory Visit: Payer: Medicare Other | Admitting: Cardiology

## 2023-06-03 ENCOUNTER — Other Ambulatory Visit: Payer: Self-pay | Admitting: Cardiology

## 2023-06-03 DIAGNOSIS — E7800 Pure hypercholesterolemia, unspecified: Secondary | ICD-10-CM

## 2023-06-04 NOTE — Telephone Encounter (Signed)
90 day supply with 2 RF was sent to patient pharmacy. Patient will need a FU appointment for additional refills.

## 2023-06-11 DIAGNOSIS — R051 Acute cough: Secondary | ICD-10-CM | POA: Diagnosis not present

## 2023-06-11 DIAGNOSIS — J3489 Other specified disorders of nose and nasal sinuses: Secondary | ICD-10-CM | POA: Diagnosis not present

## 2023-06-11 DIAGNOSIS — R197 Diarrhea, unspecified: Secondary | ICD-10-CM | POA: Diagnosis not present

## 2023-10-28 DIAGNOSIS — Z85828 Personal history of other malignant neoplasm of skin: Secondary | ICD-10-CM | POA: Diagnosis not present

## 2023-10-28 DIAGNOSIS — L821 Other seborrheic keratosis: Secondary | ICD-10-CM | POA: Diagnosis not present

## 2023-10-28 DIAGNOSIS — Z08 Encounter for follow-up examination after completed treatment for malignant neoplasm: Secondary | ICD-10-CM | POA: Diagnosis not present

## 2023-10-28 DIAGNOSIS — L570 Actinic keratosis: Secondary | ICD-10-CM | POA: Diagnosis not present

## 2023-10-28 DIAGNOSIS — D225 Melanocytic nevi of trunk: Secondary | ICD-10-CM | POA: Diagnosis not present

## 2023-10-28 DIAGNOSIS — L814 Other melanin hyperpigmentation: Secondary | ICD-10-CM | POA: Diagnosis not present

## 2024-02-25 ENCOUNTER — Other Ambulatory Visit: Payer: Self-pay | Admitting: Cardiology

## 2024-02-25 DIAGNOSIS — E7800 Pure hypercholesterolemia, unspecified: Secondary | ICD-10-CM

## 2024-06-07 DIAGNOSIS — I10 Essential (primary) hypertension: Secondary | ICD-10-CM | POA: Diagnosis not present

## 2024-06-07 DIAGNOSIS — I25118 Atherosclerotic heart disease of native coronary artery with other forms of angina pectoris: Secondary | ICD-10-CM | POA: Diagnosis not present

## 2024-06-07 DIAGNOSIS — Z0000 Encounter for general adult medical examination without abnormal findings: Secondary | ICD-10-CM | POA: Diagnosis not present

## 2024-06-07 DIAGNOSIS — R739 Hyperglycemia, unspecified: Secondary | ICD-10-CM | POA: Diagnosis not present

## 2024-06-09 ENCOUNTER — Ambulatory Visit (INDEPENDENT_AMBULATORY_CARE_PROVIDER_SITE_OTHER)

## 2024-06-09 ENCOUNTER — Ambulatory Visit

## 2024-06-09 DIAGNOSIS — G629 Polyneuropathy, unspecified: Secondary | ICD-10-CM

## 2024-06-09 DIAGNOSIS — M79671 Pain in right foot: Secondary | ICD-10-CM

## 2024-06-09 DIAGNOSIS — R2681 Unsteadiness on feet: Secondary | ICD-10-CM

## 2024-06-09 DIAGNOSIS — M21371 Foot drop, right foot: Secondary | ICD-10-CM | POA: Diagnosis not present

## 2024-06-09 MED ORDER — GABAPENTIN 100 MG PO CAPS: 100.0000 mg | ORAL_CAPSULE | Freq: Every day | ORAL | 2 refills | Status: AC

## 2024-06-09 NOTE — Progress Notes (Addendum)
 Subjective:  Patient ID: Cody Floyd, male    DOB: 25-Mar-1941,  MRN: [id number]  Chief Complaint  Patient presents with   Toe Pain    Patient stated that his two toes on his right foot, it has been going on for a 3 months now and every time he steps down he is stepping on his toes     83 y.o. male presents with the above complaint.  His wife is also present.  They relate to a history of gait unsteadiness causing falls.  He feels that some of his gait unsteadiness is caused by his right second toe folding under the ball of his foot.  He does also relate to subjective sensations of burning, tingling.  Other providers have attempted to recommend physical therapy which up to this point he has not been amendable to.  Review of Systems: Negative except as noted in the HPI. Denies N/V/F/Ch.  Past Medical History:  Diagnosis Date   Arthritis    a little all over my body (06/01/2018)   BPH (benign prostatic hyperplasia)    Chronic lower back pain    Coronary artery disease    GERD (gastroesophageal reflux disease)    History of BPH    Hyperlipidemia    Hypertension    Numbness    right arm   RLS (restless legs syndrome)    Sciatica    Ventral hernia    Vertigo     Current Outpatient Medications:    aspirin  EC 81 MG tablet, Take 81 mg by mouth daily., Disp: , Rfl:    atorvastatin  (LIPITOR) 20 MG tablet, Take 1 tablet (20 mg total) by mouth daily., Disp: 100 tablet, Rfl: 3   chlorthalidone  (HYGROTON ) 25 MG tablet, Take 0.5 tablets (12.5 mg total) by mouth every morning., Disp: 45 tablet, Rfl: 3   dorzolamide-timolol (COSOPT) 22.3-6.8 MG/ML ophthalmic solution, Place 1 drop into both eyes in the morning and at bedtime., Disp: , Rfl:    ezetimibe  (ZETIA ) 10 MG tablet, TAKE 1 TABLET BY MOUTH ONCE DAILY IN THE EVENING, Disp: 90 tablet, Rfl: 2   fluticasone  (FLONASE ) 50 MCG/ACT nasal spray, Place 2 sprays into both nostrils daily., Disp: 9.9 mL, Rfl: 0   gabapentin  (NEURONTIN ) 100 MG  capsule, Take 1 capsule (100 mg total) by mouth daily after supper., Disp: 30 capsule, Rfl: 2   losartan  (COZAAR ) 25 MG tablet, Take 1 tablet (25 mg total) by mouth every evening., Disp: 90 tablet, Rfl: 3   meclizine  (ANTIVERT ) 25 MG tablet, Take 25 mg by mouth 3 (three) times daily as needed for dizziness., Disp: , Rfl:    nitroGLYCERIN  (NITROSTAT ) 0.4 MG SL tablet, Place 0.4 mg under the tongue every 5 (five) minutes as needed for chest pain., Disp: , Rfl:    omeprazole  (PRILOSEC ) 20 MG capsule, Take 20 mg by mouth daily., Disp: , Rfl:    potassium chloride  SA (KLOR-CON  M20) 20 MEQ tablet, Take 1 tablet (20 mEq total) by mouth daily., Disp: 30 tablet, Rfl: 3   pramipexole  (MIRAPEX ) 0.5 MG tablet, TAKE 1 TABLET(0.5 MG) BY MOUTH AT BEDTIME, Disp: 90 tablet, Rfl: 0   terazosin  (HYTRIN ) 5 MG capsule, Take 5 mg by mouth at bedtime., Disp: , Rfl:   Social History   Tobacco Use  Smoking Status Never  Smokeless Tobacco Never    Allergies  Allergen Reactions   Lisinopril  Cough   Objective:  There were no vitals filed for this visit. There is no height or weight  on file to calculate BMI. Constitutional Well developed. Well nourished. Oriented to person, place, and time.  Vascular Dorsalis pedis pulses palpable bilaterally. Posterior tibial pulses palpable bilaterally. Capillary refill normal to all digits.  No cyanosis or clubbing noted. Pedal hair growth normal.  Neurologic Normal speech. Epicritic sensation to light touch grossly present bilaterally. Negative tinel sign at tarsal tunnel bilaterally.   Dermatologic Skin texture and turgor are within normal limits.  No open wounds. No skin lesions.  Musculoskeletal: 5 out of 5 muscle strength to most major pedal muscle groups.  Decreased strength of FDL causing weakness and attempted digital flexion.  Decreased strength of tibialis anterior tendon. Rectus foot shape.  No pain with first MTP range of motion.   Radiographs: Taken and  reviewed.  3 views of the right foot were taken today.  This does show minimal joint space narrowing of the right first metatarsophalangeal joint.  Joint spaces are otherwise well-maintained.  There are no acute osseous findings such as fracture or dislocation.  Rectus foot shape.  Assessment:   1. Pain in right foot   2. Unsteady gait when walking   3. Neuropathy   4. Right foot drop    Plan:  - Patient was evaluated and treated and all questions answered.  Unsteady gait, neuropathy, pain - I discussed the patient's pathology with him and his wife.  We discussed multiple treatment options.  There is no deformity to the second digit that he describes as rolling under the ball of his foot while ambulating.  His complaint appears consistent with foot drop to the right foot.  They will make an appointment with [facility] for custom orthotics casting.  - We also discussed the utilization of physical therapy for his gait unsteadiness and footdrop.  The patient has been resistant to going to physical therapy when discussed with his past providers.  We had a long discussion about utilization of physical therapy to increase gait steadiness and confidence in gait.  He expresses that he is willing to attempt this.  Recommend 2 times per week for 6 weeks. - We also discussed his continued neuropathic pain that he describes as intermittent burning and tingling.  He has not attempted gabapentin  therapy.  I recommended that we start with a low dose taken once daily before bed. 100mg  every day PO. Dose can be adjusted pending his response.   RTC 6 weeks  Prentice Ovens, DPM AACFAS Fellowship Trained Podiatric Surgeon [facility]

## 2024-06-13 DIAGNOSIS — I739 Peripheral vascular disease, unspecified: Secondary | ICD-10-CM | POA: Diagnosis not present

## 2024-06-13 DIAGNOSIS — K439 Ventral hernia without obstruction or gangrene: Secondary | ICD-10-CM | POA: Diagnosis not present

## 2024-06-13 DIAGNOSIS — G629 Polyneuropathy, unspecified: Secondary | ICD-10-CM | POA: Diagnosis not present

## 2024-06-13 DIAGNOSIS — Z0000 Encounter for general adult medical examination without abnormal findings: Secondary | ICD-10-CM | POA: Diagnosis not present

## 2024-06-13 DIAGNOSIS — I10 Essential (primary) hypertension: Secondary | ICD-10-CM | POA: Diagnosis not present

## 2024-06-13 DIAGNOSIS — R531 Weakness: Secondary | ICD-10-CM | POA: Diagnosis not present

## 2024-06-13 DIAGNOSIS — I25118 Atherosclerotic heart disease of native coronary artery with other forms of angina pectoris: Secondary | ICD-10-CM | POA: Diagnosis not present

## 2024-06-13 DIAGNOSIS — K219 Gastro-esophageal reflux disease without esophagitis: Secondary | ICD-10-CM | POA: Diagnosis not present

## 2024-06-13 DIAGNOSIS — G2581 Restless legs syndrome: Secondary | ICD-10-CM | POA: Diagnosis not present

## 2024-06-13 DIAGNOSIS — R600 Localized edema: Secondary | ICD-10-CM | POA: Diagnosis not present

## 2024-06-13 DIAGNOSIS — Z23 Encounter for immunization: Secondary | ICD-10-CM | POA: Diagnosis not present

## 2024-06-20 ENCOUNTER — Ambulatory Visit

## 2024-06-20 DIAGNOSIS — R2681 Unsteadiness on feet: Secondary | ICD-10-CM

## 2024-06-20 NOTE — Progress Notes (Signed)
 [id number] [employer]  Notification or Prior Authorization is not required for the requested services You are not required to submit a notification/prior authorization based on the information provided. If you have general questions about the prior authorization requirements, visit [email or web address] > Clinician Resources > Advance and Admission Notification Requirements. The number above acknowledges your notification. Please write this reference number down for future reference. If you would like to request an organization determination, please call us  at [phone]. Decision ID #: [id number] The number above acknowledges your inquiry and our response. Please write this number down and refer to it for future inquiries. Coverage and payment for an item or service is governed by the member's benefit plan document, and, if applicable, the provider's participation agreement with the Health Plan   Patient was measured today for right AFO  brace / to help treat Rigth Foot Drop unsteadiness of gail and risk of falls   Patient has been having trouble walking stating foot is catching when walking and is causing him lose balance  L1932 walk on AFO will help increase gait increase toe off and propulsion increase support and confidence  Patient will be starting PT soon as well to help strengthen muscle   No auth needed 20% coins patient is aware brace will be ordered today   Lolita Schultze Cped, CFo, CFm

## 2024-06-21 NOTE — Addendum Note (Signed)
 Addended by: MAGDALEN BARTER on: 06/21/2024 10:11 AM   Modules accepted: Orders

## 2024-07-19 ENCOUNTER — Ambulatory Visit

## 2024-07-25 ENCOUNTER — Other Ambulatory Visit

## 2024-07-26 ENCOUNTER — Ambulatory Visit: Admitting: Podiatrist

## 2024-07-26 DIAGNOSIS — R2681 Unsteadiness on feet: Secondary | ICD-10-CM

## 2024-07-26 DIAGNOSIS — M21371 Foot drop, right foot: Secondary | ICD-10-CM

## 2024-07-26 NOTE — Progress Notes (Signed)
 Cody Floyd was seen today for dispensing of dropfoot brace to the right foot.   Brace dispensed and instructions for wear given.  Brace was noted to fit nicely in his shoes and no area of rub or friction are identified.  Discussed if he notices any areas where the brace rubs, to call so we can adjust as needed.    Also Discussed physical therapy- as he was having a little bit of trouble getting used to walking in the brace.  Will re fax a referral for [facility] at [facility] [address] for gait training and ambulation assistance as well as strength and conditioning if needed.   2 x per week for 10-12 weeks recommended.
